# Patient Record
Sex: Male | Born: 1950 | Race: White | Hispanic: No | Marital: Married | Smoking: Never smoker
Health system: Southern US, Community
[De-identification: ages and names within clinical notes are randomized; demographics above are authoritative.]

## PROBLEM LIST (undated history)

## (undated) DIAGNOSIS — J90 Pleural effusion, not elsewhere classified: Secondary | ICD-10-CM

## (undated) DIAGNOSIS — I493 Ventricular premature depolarization: Secondary | ICD-10-CM

## (undated) DIAGNOSIS — E119 Type 2 diabetes mellitus without complications: Secondary | ICD-10-CM

## (undated) DIAGNOSIS — Z9989 Dependence on other enabling machines and devices: Secondary | ICD-10-CM

## (undated) DIAGNOSIS — I5032 Chronic diastolic (congestive) heart failure: Secondary | ICD-10-CM

## (undated) DIAGNOSIS — N189 Chronic kidney disease, unspecified: Secondary | ICD-10-CM

## (undated) DIAGNOSIS — K7682 Hepatic encephalopathy: Secondary | ICD-10-CM

## (undated) DIAGNOSIS — D126 Benign neoplasm of colon, unspecified: Secondary | ICD-10-CM

## (undated) DIAGNOSIS — K739 Chronic hepatitis, unspecified: Secondary | ICD-10-CM

## (undated) DIAGNOSIS — D61818 Other pancytopenia: Secondary | ICD-10-CM

## (undated) DIAGNOSIS — M1990 Unspecified osteoarthritis, unspecified site: Secondary | ICD-10-CM

## (undated) DIAGNOSIS — Z800 Family history of malignant neoplasm of digestive organs: Secondary | ICD-10-CM

## (undated) DIAGNOSIS — G4733 Obstructive sleep apnea (adult) (pediatric): Secondary | ICD-10-CM

## (undated) DIAGNOSIS — J302 Other seasonal allergic rhinitis: Secondary | ICD-10-CM

## (undated) DIAGNOSIS — K649 Unspecified hemorrhoids: Secondary | ICD-10-CM

## (undated) DIAGNOSIS — I77819 Aortic ectasia, unspecified site: Secondary | ICD-10-CM

## (undated) DIAGNOSIS — R0602 Shortness of breath: Secondary | ICD-10-CM

## (undated) DIAGNOSIS — K7581 Nonalcoholic steatohepatitis (NASH): Secondary | ICD-10-CM

## (undated) DIAGNOSIS — K7460 Unspecified cirrhosis of liver: Secondary | ICD-10-CM

## (undated) DIAGNOSIS — R1310 Dysphagia, unspecified: Secondary | ICD-10-CM

## (undated) DIAGNOSIS — I10 Essential (primary) hypertension: Secondary | ICD-10-CM

## (undated) DIAGNOSIS — K7290 Hepatic failure, unspecified without coma: Secondary | ICD-10-CM

## (undated) DIAGNOSIS — R161 Splenomegaly, not elsewhere classified: Secondary | ICD-10-CM

## (undated) DIAGNOSIS — Z139 Encounter for screening, unspecified: Secondary | ICD-10-CM

## (undated) HISTORY — DX: Dysphagia, unspecified: R13.10

## (undated) HISTORY — DX: Splenomegaly, not elsewhere classified: R16.1

## (undated) HISTORY — PX: HEMORRHOID SURGERY: SHX153

## (undated) HISTORY — DX: Chronic hepatitis, unspecified: K73.9

## (undated) HISTORY — DX: Essential (primary) hypertension: I10

## (undated) HISTORY — DX: Unspecified hemorrhoids: K64.9

## (undated) HISTORY — PX: ANAL FISSURE REPAIR: SHX2312

## (undated) HISTORY — DX: Benign neoplasm of colon, unspecified: D12.6

## (undated) HISTORY — DX: Other seasonal allergic rhinitis: J30.2

## (undated) HISTORY — DX: Type 2 diabetes mellitus without complications: E11.9

## (undated) HISTORY — DX: Aortic ectasia, unspecified site: I77.819

## (undated) HISTORY — DX: Family history of malignant neoplasm of digestive organs: Z80.0

---

## 1999-08-01 ENCOUNTER — Encounter: Admission: RE | Admit: 1999-08-01 | Discharge: 1999-08-01 | Payer: Self-pay | Admitting: Gastroenterology

## 1999-08-01 ENCOUNTER — Encounter: Payer: Self-pay | Admitting: Gastroenterology

## 1999-08-22 ENCOUNTER — Encounter (INDEPENDENT_AMBULATORY_CARE_PROVIDER_SITE_OTHER): Payer: Self-pay | Admitting: *Deleted

## 1999-08-22 ENCOUNTER — Ambulatory Visit (HOSPITAL_COMMUNITY): Admission: RE | Admit: 1999-08-22 | Discharge: 1999-08-22 | Payer: Self-pay | Admitting: Gastroenterology

## 2002-08-20 ENCOUNTER — Encounter: Admission: RE | Admit: 2002-08-20 | Discharge: 2002-11-18 | Payer: Self-pay | Admitting: Family Medicine

## 2002-11-18 ENCOUNTER — Encounter: Admission: RE | Admit: 2002-11-18 | Discharge: 2003-02-16 | Payer: Self-pay | Admitting: Family Medicine

## 2003-04-28 ENCOUNTER — Encounter (INDEPENDENT_AMBULATORY_CARE_PROVIDER_SITE_OTHER): Payer: Self-pay | Admitting: *Deleted

## 2003-04-28 ENCOUNTER — Ambulatory Visit (HOSPITAL_COMMUNITY): Admission: RE | Admit: 2003-04-28 | Discharge: 2003-04-28 | Payer: Self-pay | Admitting: Gastroenterology

## 2004-01-12 ENCOUNTER — Ambulatory Visit (HOSPITAL_BASED_OUTPATIENT_CLINIC_OR_DEPARTMENT_OTHER): Admission: RE | Admit: 2004-01-12 | Discharge: 2004-01-12 | Payer: Self-pay | Admitting: Family Medicine

## 2004-04-18 ENCOUNTER — Ambulatory Visit: Payer: Self-pay | Admitting: Pulmonary Disease

## 2004-10-03 ENCOUNTER — Encounter: Admission: RE | Admit: 2004-10-03 | Discharge: 2005-01-01 | Payer: Self-pay | Admitting: Family Medicine

## 2005-02-23 ENCOUNTER — Encounter: Admission: RE | Admit: 2005-02-23 | Discharge: 2005-05-24 | Payer: Self-pay | Admitting: Family Medicine

## 2005-11-07 ENCOUNTER — Encounter
Admission: RE | Admit: 2005-11-07 | Discharge: 2005-11-07 | Payer: Self-pay | Admitting: Certified Registered Nurse Anesthetist

## 2007-03-28 ENCOUNTER — Encounter
Admission: RE | Admit: 2007-03-28 | Discharge: 2007-03-28 | Payer: Self-pay | Admitting: Certified Registered Nurse Anesthetist

## 2008-01-30 ENCOUNTER — Encounter: Admission: RE | Admit: 2008-01-30 | Discharge: 2008-01-30 | Payer: Self-pay | Admitting: Gastroenterology

## 2008-06-01 ENCOUNTER — Encounter: Admission: RE | Admit: 2008-06-01 | Discharge: 2008-06-01 | Payer: Self-pay | Admitting: Gastroenterology

## 2008-10-05 ENCOUNTER — Ambulatory Visit (HOSPITAL_COMMUNITY): Admission: RE | Admit: 2008-10-05 | Discharge: 2008-10-06 | Payer: Self-pay | Admitting: Surgery

## 2008-10-05 ENCOUNTER — Encounter (INDEPENDENT_AMBULATORY_CARE_PROVIDER_SITE_OTHER): Payer: Self-pay | Admitting: Surgery

## 2009-04-26 ENCOUNTER — Encounter: Admission: RE | Admit: 2009-04-26 | Discharge: 2009-04-26 | Payer: Self-pay | Admitting: Gastroenterology

## 2009-06-30 ENCOUNTER — Ambulatory Visit (HOSPITAL_BASED_OUTPATIENT_CLINIC_OR_DEPARTMENT_OTHER): Admission: RE | Admit: 2009-06-30 | Discharge: 2009-06-30 | Payer: Self-pay | Admitting: Family Medicine

## 2009-07-03 ENCOUNTER — Ambulatory Visit: Payer: Self-pay | Admitting: Internal Medicine

## 2010-06-02 ENCOUNTER — Encounter
Admission: RE | Admit: 2010-06-02 | Discharge: 2010-06-02 | Payer: Self-pay | Source: Home / Self Care | Attending: Certified Registered Nurse Anesthetist | Admitting: Certified Registered Nurse Anesthetist

## 2010-06-26 ENCOUNTER — Encounter: Payer: Self-pay | Admitting: Gastroenterology

## 2010-09-13 LAB — BASIC METABOLIC PANEL
BUN: 17 mg/dL (ref 6–23)
Creatinine, Ser: 0.99 mg/dL (ref 0.4–1.5)
GFR calc non Af Amer: >60 mL/min (ref >60)
Glucose, Bld: 176 mg/dL — ABNORMAL HIGH (ref 70–99)

## 2010-09-13 LAB — CBC
HCT: 41.9 % (ref 39.0–52.0)
MCV: 94.9 fL (ref 78.0–100.0)
Platelets: 134 10*3/uL — ABNORMAL LOW (ref 150–400)
RDW: 12.8 % (ref 11.5–15.5)
WBC: 6.2 10*3/uL (ref 4.0–10.5)

## 2010-09-13 LAB — HEPATIC FUNCTION PANEL
ALT: 43 U/L (ref 0–53)
Bilirubin, Direct: 0.2 mg/dL (ref 0.0–0.3)
Indirect Bilirubin: 0.9 mg/dL (ref 0.3–0.9)
Total Protein: 6.8 g/dL (ref 6.0–8.3)

## 2010-09-13 LAB — APTT: aPTT: 33 seconds (ref 24–37)

## 2010-09-13 LAB — PROTIME-INR: INR: 1.1 (ref 0.00–1.49)

## 2010-10-18 NOTE — Op Note (Signed)
NAMEIRVEN, INGALSBE NO.:  [id number]   MEDICAL RECORD NO.:  [id number]          PATIENT TYPE:  AMB   LOCATION:  SDS                          FACILITY:  [facility]   PHYSICIAN:  Ardeth Sportsman, MD     DATE OF BIRTH:  1950-11-20   DATE OF PROCEDURE:  10/05/2008  DATE OF DISCHARGE:                               OPERATIVE REPORT   PRIMARY CARE PHYSICIAN:  Elvera Lennox, [place]   GASTROENTEROLOGIST:  Bernette Redbird, MD., [facility]   SURGEON:  Ardeth Sportsman, MD   ASSISTANT:  None.   PREOPERATIVE DIAGNOSES:  1. Severe anal pain and bleeding.  2. Posterior anal fissure.  3. Hemorrhoids with bleeding and prolapse.   POSTOPERATIVE DIAGNOSES:  1. 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  2. Prolapsing hemorrhoids, left lateral and right anterior, greater      than right posterior.   PROCEDURES PERFORMED:  1. Examination under anesthesia.  2. Left lateral internal sphincterotomy.  3. Left lateral hemorrhoidectomy.  4. Right anterior hemorrhoidectomy.   ANESTHESIA:  1. General anesthesia.  2. Bilateral anorectal field block.   ESTIMATED BLOOD LOSS:  100 mL.   DRAINS:  None.   COMPLICATIONS:  None apparent.   INDICATIONS:  Mr. Wedel is a 60 year old morbidly obese male who has had  severe pain in the perianal region with evidence of anal fissure.  He  has had colonoscopy in 2008 that just showed some adenomas in the  distant past.  He had hemorrhoidectomy over 10 years ago.  He had  evidence of severe tenderness in his posterior midline and fissure in  the office as well as some prolapse and hemorrhoids.  He had tried  Diltiazem for over 2 weeks without any improvement in burning.  Given  his maximal medical therapy without any improvement, recommendation was  made for exam under anesthesia with probable left lateral internal  sphincterotomy and probably hemorrhoidectomy as well.  Risks, benefits,  and  alternatives were discussed, questions answered, and he agreed to  proceed.   OPERATIVE FINDINGS:  He had a very hypertensive internal hemorrhoidal  sphincter.  External seemed intact.  He did have moderate prolapsing.  He did have chronic internal and external hemorrhoids in the left  lateral and right anterior aspect with prolapse.   DESCRIPTION OF PROCEDURE:  Informed consent was confirmed.  The patient  received IV cephalexin per Surgery.  He had sequential compression  devices active during the entire case.  He underwent general anesthesia  with laryngeal mask airway and positioned in high lithotomy without any  difficulty.  A surgical time-out confirmed our plan.   Digital examination felt the hypertensive sphincter and examination also  noted a posterior midline sphincter.  His left lateral hemorrhoid was  very prolapsed and large, therefore, after moving up and taking a clamp  on the hemorrhoid and using a cautery to help trim the left lateral  hemorrhoid as best as possible.  I did try and I clamped the base to do  a suture ligature with 2-0 chromic  stitch.   I was able to easily separate the internal sphincter from the external  sphincter in the groove  and elevated and transected that using  a  cautery.  I tried to transect it about the distal 2/3rd of the  sphincter, trying to keep some proximal internal sphincter intact, that  was above where the fissure was, that relaxed the sphincter well.  The  wound was closed using running 2-0 chromic stitch in the longitudinal  fashion.  I did close at the anal verge more horizontally just to avoid  any tightening of the area.   He had a very large right anterior hemorrhoid as well and excising it in  similar fashion, taking clamp and using cautery to excise out the  hemorrhoidal tissue.  I aired on the side of keeping some rectum and  anoderm, and taking hemorrhoidal tissue underneath creating a little bit  of rectal flaps.   There was brisk bleeding of large hemorrhoidal vessels  that took some suture ligature to ultimately control.  That wound was  closed in a similar fashion as well.   Copious irrigation was done with clear return at the end.  There were  little bit of oozing on each side that I controlled with a figure-of-  eight suture ligature on each closure site.  Good result.  Hemostasis  was good at the end.  Wound was packed with a large Gelfoam up into the  rectum.  Counts were correct.  The patient was extubated and taken to  recovery room in stable condition.   I am about to explain the operative findings to the patient's wife.  Per  his request, I did discuss postoperative care with him in detail as  well.  He should be able to go home later today if his pain is  adequately controlled.      Ardeth Sportsman, MD  Electronically Signed     SCG/MEDQ  D:  10/05/2008  T:  10/06/2008  Job:  [id number]   cc:   Bernette Redbird, M.D.  Elvera Lennox, [place]

## 2010-10-21 NOTE — Procedures (Signed)
[facility]  Patient:    Peter Larson, Peter Larson                       MRN: [id number] Proc. Date: 08/22/99 Adm. Date:  [id number] Attending:  Rich Brave CC:         Anna Genre. Little, M.D.                           Procedure Report  PROCEDURE:  Colonoscopy with polypectomy.  SURGEON:  Florencia Reasons, M.D.  INDICATIONS:  A 60 year old with family history of colon cancer.  FINDINGS:  Seven polyps removed, most of them approximately 6-8 mm in diameter nd semipedunculated in morphology.  Mild sigmoid diverticulosis.  PROCEDURE:  The nature, purpose, and risks of the procedure had been discussed ith the patient, who provided written consent.  Sedation was fentanyl 100 mcg and Versed 10 mg IV without arrhythmias or significant desaturation during the course of the procedure.  The Olympus adult video colonoscope was easily advanced to the level just above the ileocecal valve, allowing me to look into the cecum.  I was able to see virtually the entire cecal surface area, including the part just below the ileocecal valve. We were not able to intubate the base of the cecum despite having the patient in the supine position and applying some external abdominal compression.  Although, there was slight friability of the ileocecal valve, I did not feel it was abnormal in appearance.  Pullback was then performed.  The quality of the prep as excellent and it was felt that all areas were well seen.  During the course of both insertion and extraction, I removed a total of seven polyps in this patient.  Two or three were quite small, probably on the order of about 4 mm or so, but slightly semipedunculated in configuration and amenable to snare removal.  There were also several additional polyps, about 8-10 mm in diameter, which were semipedunculated and readily snared off.  There was no problem with significant bleeding or any excessive cautery  at any f the polypectomy sites.  There was some mild sigmoid diverticulosis, but no colitis, vascular malformations, obvious cancers or severe diverticulosis was observed.  PLAN:  Await pathology on todays polyps with anticipated colonoscopic follow-up in three years. DD:  08/22/99 TD:  08/22/99 Job: [id number]

## 2010-10-21 NOTE — Op Note (Signed)
NAME:  Peter Larson, Peter Larson                          ACCOUNT NO.:  [id number]   MEDICAL RECORD NO.:  [id number]                   PATIENT TYPE:  AMB   LOCATION:  ENDO                                 FACILITY:  [facility]   PHYSICIAN:  Bernette Redbird, M.D.                DATE OF BIRTH:  1951/04/12   DATE OF PROCEDURE:  04/28/2003  DATE OF DISCHARGE:                                 OPERATIVE REPORT   PROCEDURE:  Colonoscopy with biopsy.   INDICATIONS FOR PROCEDURE:  A 60 year old with family history of colon  cancer who presents with history of numerous small adenomatous polyps having  been removed colonoscopically three years ago.   FINDINGS:  Solitary sessile diminutive polyp. Mild sigmoid diverticulosis.  Small portion of cecum not seen.   DESCRIPTION OF PROCEDURE:  The nature, purpose and risk of the procedure  were familiar to the patient from prior examination. He provided written  consent. Sedation was fentanyl 50 mcg and Versed 4 mg IV without arrhythmias  or desaturation, to a level of very mild sedation. Digital exam of the  prostate was suboptimal due to the patient's large body habitus but no  distal prostatic abnormalities were appreciated.   The Olympus adult video colonoscope was used for this procedure and advanced  to its entire length. Unfortunately, the tip of the scope never got below  the level of the ileocecal valve so I was never able to see immediately  under the valve. The remainder of the cecum, however, looked normal. We  turned the patient into the supine position, right lateral decubitus  position and prone position as well as the standard left lateral decubitus  position, we applied external abdominal compression and we used the biopsy  forceps to elevate the cecal mucosa. With all these maneuvers, the vast  majority of the cecum was able to be seen and as mentioned above, just a  small portion immediately under the valve was never truly visualized. I am  confident that no significant mass, lesions were present but small or flat  polyps could have been missed if they were in that location. Pullback was  then performed. The quality of the prep was quite good with a little bit of  stool film on the proximal colon, which it is not felt would have obscured  any significant findings. At about 30 cm from the external anal opening  (probably about 25 cm from the anal verge), I encountered a 2-3 mm sessile  polyp removed by a single cold biopsy.   No other polyps were seen. There was no evidence of cancer, colitis or  vascular malformations.   There was mild sigmoid diverticulosis.  Retroflexion in the rectum and  reinspection of the rectosigmoid was otherwise unremarkable.   The patient tolerated the procedure well and there were no apparent  complications.   IMPRESSION:  Solitary polyp in a patient with a family history  of colon  cancer and a personal history of several adenomas  having previously been  removed.   PLAN:  Await pathology results. Anticipate followup colonoscopy in about  three years because of the inability to visualize the entire cecum and  because of the suboptimal enlarged number of previous polyps, despite the  favorable findings on this exam.                                               Bernette Redbird, M.D.    RB/MEDQ  D:  04/28/2003  T:  04/28/2003  Job:  [id number]   cc:   Caryn Bee L. Little, M.D.  [address]  [place]  [place] [address]  Fax: [phone]

## 2010-10-21 NOTE — Procedures (Signed)
NAME:  Peter Larson, Peter Larson              ACCOUNT NO.:  [id number]   MEDICAL RECORD NO.:  [id number]          PATIENT TYPE:  OUT   LOCATION:  SLEEP CENTER                 FACILITY:  [facility]   PHYSICIAN:  Clinton D. Maple Hudson, M.D. DATE OF BIRTH:  08/07/1950   DATE OF ADMISSION:  01/12/2004  DATE OF DISCHARGE:  01/12/2004                              NOCTURNAL POLYSOMNOGRAM   REFERRING PHYSICIAN:  Dr. Caryn Bee L. Little.   INDICATION FOR STUDY:  Hypersomnia with sleep apnea.   EPWORTH SLEEPINESS SCORE:  15/24.   NECK SIZE:  21.5.   BODY MASS INDEX:  BMI 45, weight 350 pounds.   MEDICATIONS LISTED:  Allegra, Astelin, Ecotrin, gemfibrozil,  hydrochlorothiazide, Nasonex, Nexium, Quinapril, Xenical.   SLEEP ARCHITECTURE:  Total sleep time 314 minutes with sleep deficiency 82%;  stage I was 7%, stage II was 68%, stages III and IV 19%; REM was 6% of total  sleep time.  Latency to sleep onset 10 minutes.  Latency to REM 279 minutes.  Awake after sleep onset -- 59 minutes.  Arousal index 52 per hour, mostly  with respiratory events.   RESPIRATORY DATA:   SPLIT-STUDY PROTOCOL:  Moderately severe obstructive sleep apnea/hypopnea,  RDI 37.9 before CPAP.  This included 102 hypopneas.  Events were not  positional.  REM RDI 6.3 per hour.  CPAP was titrated to 17 CWP, RDI 3.9 per  hour using a large Ultra Mirage full-face mask with heated humidifier,  noting complaint of sinus problem.   OXYGEN DATA:  Very loud snoring before CPAP.  Baseline room air oxygen  saturation while awake was 94% and mean oxygen saturation through the study  was 90% to 91%.  There was moderate desaturation during apneas before CPAP  to 82%.  Normal oxygenation on CPAP.   CARDIAC DATA:  Normal sinus rhythm.   MOVEMENT/PARASOMNIA:  One hundred and thirty limb jerks of which 57 caused  arousal or awakening for a periodic limb movement with arousal index of 10.9  per hour, indicating mild-to-moderate periodic limb movement with  arousal.   IMPRESSION/RECOMMENDATION:  Moderately severe obstructive sleep  apnea/hypopnea, respiratory disturbance index 37.9 per hour.  Good control  on continuous positive airway pressure at 17 CWP, respiratory disturbance  index 3.9 per hour, using a large Ultra Mirage full-face mass with heated  humidifier.  Mild-to-moderate oxygen desaturation was corrected by  continuous positive airway pressure.  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                                   ______________________________                                Rennis Chris. Maple Hudson, M.D.                                Diplomate, American Board of Sleep Medicine    CDY/MEDQ  D:  01/17/2004 14:20:39  T:  01/18/2004 11:50:15  Job:  [id number]

## 2011-06-12 ENCOUNTER — Other Ambulatory Visit: Payer: Self-pay | Admitting: Dermatology

## 2011-07-26 ENCOUNTER — Other Ambulatory Visit: Payer: Self-pay | Admitting: Gastroenterology

## 2011-07-26 DIAGNOSIS — K7460 Unspecified cirrhosis of liver: Secondary | ICD-10-CM

## 2011-07-28 ENCOUNTER — Ambulatory Visit
Admission: RE | Admit: 2011-07-28 | Discharge: 2011-07-28 | Disposition: A | Discharge status: Home / Self Care | Payer: 59 | Source: Ambulatory Visit | Attending: Gastroenterology | Admitting: Gastroenterology

## 2011-07-28 DIAGNOSIS — K7460 Unspecified cirrhosis of liver: Secondary | ICD-10-CM

## 2012-02-08 ENCOUNTER — Other Ambulatory Visit: Payer: Self-pay | Admitting: Gastroenterology

## 2013-01-01 DIAGNOSIS — E119 Type 2 diabetes mellitus without complications: Secondary | ICD-10-CM | POA: Insufficient documentation

## 2013-01-09 ENCOUNTER — Other Ambulatory Visit: Payer: Self-pay | Admitting: Gastroenterology

## 2013-01-09 DIAGNOSIS — K7689 Other specified diseases of liver: Secondary | ICD-10-CM

## 2013-01-13 ENCOUNTER — Ambulatory Visit
Admission: RE | Admit: 2013-01-13 | Discharge: 2013-01-13 | Disposition: A | Discharge status: Home / Self Care | Payer: 59 | Source: Ambulatory Visit | Attending: Gastroenterology | Admitting: Gastroenterology

## 2013-01-13 DIAGNOSIS — K7689 Other specified diseases of liver: Secondary | ICD-10-CM

## 2013-01-13 MED ORDER — IOHEXOL 300 MG/ML  SOLN
125.0000 mL | Freq: Once | INTRAMUSCULAR | Status: AC | PRN
  Administered 2013-01-13: 125 mL via INTRAVENOUS

## 2013-05-16 ENCOUNTER — Other Ambulatory Visit (HOSPITAL_COMMUNITY): Payer: 59

## 2013-05-22 ENCOUNTER — Ambulatory Visit: Payer: 59 | Admitting: Cardiology

## 2013-06-03 ENCOUNTER — Other Ambulatory Visit (HOSPITAL_COMMUNITY): Payer: 59

## 2013-07-16 ENCOUNTER — Encounter: Payer: Self-pay | Admitting: General Surgery

## 2013-07-16 DIAGNOSIS — I10 Essential (primary) hypertension: Secondary | ICD-10-CM | POA: Insufficient documentation

## 2013-07-16 DIAGNOSIS — I493 Ventricular premature depolarization: Secondary | ICD-10-CM | POA: Insufficient documentation

## 2013-07-16 DIAGNOSIS — G4733 Obstructive sleep apnea (adult) (pediatric): Secondary | ICD-10-CM | POA: Insufficient documentation

## 2013-07-16 DIAGNOSIS — E669 Obesity, unspecified: Secondary | ICD-10-CM | POA: Insufficient documentation

## 2013-07-23 ENCOUNTER — Ambulatory Visit: Payer: 59 | Admitting: Cardiology

## 2013-07-23 ENCOUNTER — Other Ambulatory Visit (HOSPITAL_COMMUNITY): Payer: 59

## 2013-08-19 ENCOUNTER — Other Ambulatory Visit: Payer: Self-pay | Admitting: General Surgery

## 2013-08-19 DIAGNOSIS — I77819 Aortic ectasia, unspecified site: Secondary | ICD-10-CM

## 2013-08-20 ENCOUNTER — Ambulatory Visit (INDEPENDENT_AMBULATORY_CARE_PROVIDER_SITE_OTHER): Payer: 59 | Admitting: Cardiology

## 2013-08-20 ENCOUNTER — Encounter: Payer: Self-pay | Admitting: Cardiology

## 2013-08-20 ENCOUNTER — Encounter: Payer: Self-pay | Admitting: Cardiovascular Disease

## 2013-08-20 ENCOUNTER — Ambulatory Visit (HOSPITAL_COMMUNITY): Payer: 59 | Attending: Cardiovascular Disease | Admitting: Cardiology

## 2013-08-20 VITALS — BP 120/60 | HR 60 | Ht 73.0 in | Wt 345.0 lb

## 2013-08-20 DIAGNOSIS — G4733 Obstructive sleep apnea (adult) (pediatric): Secondary | ICD-10-CM

## 2013-08-20 DIAGNOSIS — I493 Ventricular premature depolarization: Secondary | ICD-10-CM

## 2013-08-20 DIAGNOSIS — I77819 Aortic ectasia, unspecified site: Secondary | ICD-10-CM | POA: Insufficient documentation

## 2013-08-20 DIAGNOSIS — I4949 Other premature depolarization: Secondary | ICD-10-CM

## 2013-08-20 DIAGNOSIS — R0609 Other forms of dyspnea: Secondary | ICD-10-CM | POA: Insufficient documentation

## 2013-08-20 DIAGNOSIS — R0602 Shortness of breath: Secondary | ICD-10-CM

## 2013-08-20 DIAGNOSIS — E119 Type 2 diabetes mellitus without complications: Secondary | ICD-10-CM | POA: Insufficient documentation

## 2013-08-20 DIAGNOSIS — I10 Essential (primary) hypertension: Secondary | ICD-10-CM

## 2013-08-20 DIAGNOSIS — E669 Obesity, unspecified: Secondary | ICD-10-CM

## 2013-08-20 DIAGNOSIS — R0989 Other specified symptoms and signs involving the circulatory and respiratory systems: Secondary | ICD-10-CM | POA: Insufficient documentation

## 2013-08-20 NOTE — Patient Instructions (Signed)
Your physician recommends that you continue on your current medications as directed. Please refer to the Current Medication list given to you today.  We already contacted [facility] and we ordered a ResMed AirFit P10 nasal pillow mask with chin strap and we will get a 2 week autotitration from 4 to 20cm H2O.   Your physician wants you to follow-up in: 6 Months with Peter Larson will receive a reminder letter in the mail two months in advance. If you don't receive a letter, please call our office to schedule the follow-up appointment.

## 2013-08-20 NOTE — Progress Notes (Signed)
[address] [place], [place]  [address] Phone: [phone] Fax:  [phone]  Date:  08/20/2013   ID:  Peter Larson, DOB 16-Mar-1951, MRN [id number]  PCP:  Namon Cirri  Cardiologist:  Fransico Him, MD     History of Present Illness: Peter Larson is a 63 y.o. male with a history of PVC's, OSA, obesity and HTN who presents today for followup.  He is doing well.  He denies any chest pain, LE edema, dizziness, palpitations or syncope.  He tolerates his CPAP device well.  He has not been tolerating his mask and says it keeps moving.  He thinks that the pressure is making his mask move.  He feels rested in the am but does cat nap during the day. Some days he exercises but other days not because he feels tired.  He has chronic DOE due to sedentary state and obesity and this is stable.  Wt Readings from Last 3 Encounters:  No data found for Wt     Past Medical History  Diagnosis Date  . Sleep apnea   . Liver disease     /fatty liver??early cirrhoisis--mild  . Splenomegaly     slt thrombocytopenia;no complics. egd 3/47 neg for varices, ?mild portal gastropathy; 02/2011 nl,novarices  . 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 Hypertension   . Adenomatous colon polyp '01 & '08  . Hemorrhoids     Severe anorectal pain and anal fissure w bleeding following hemorrhoidectomy may 2010  . Family history of colon cancer     mother --75  . Seasonal allergies   . Gila (hepatocellular carcinoma)     Screening CT neg 05/2008, U/S neg 04/2009,05/2010  . Dysphagia     BaS/tab neg 8/09; egd's neg for ring/strict--?dysmotility  . Chronic hepatitis   . 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 cetirizine (ZYRTEC) 10 MG tablet Take 10 mg by mouth daily.      . cholecalciferol (VITAMIN D) 1000 UNITS tablet Take 2,000 Units by mouth daily.      Marland Kitchen esomeprazole (NEXIUM) 40 MG capsule Take 40 mg  by mouth daily at 12 noon.      . furosemide (LASIX) 20 MG tablet Take 20 mg by mouth as needed (leg swelling).      Marland Kitchen losartan-hydrochlorothiazide (HYZAAR) 100-25 MG per tablet Take 1 tablet by mouth daily.      . milk thistle 175 MG tablet Take 350 mg by mouth daily.      . Multiple Vitamin (MULTIVITAMIN) tablet Take 1 tablet by mouth daily.      Marland Kitchen omega-3 acid ethyl esters (LOVAZA) 1 G capsule Take 2 g by mouth 2 (two) times daily.      Marland Kitchen spironolactone (ALDACTONE) 50 MG tablet Take 50 mg by mouth.       No current facility-administered medications for this visit.    Allergies:   No Known Allergies  Social History:  The patient  reports that he has never smoked. He does not have any smokeless tobacco history on file. He reports that he does not drink alcohol or use illicit drugs.   Family History:  The patient's family history is not on file.   ROS:  Please see the history of present illness.      All other systems reviewed and negative.   PHYSICAL EXAM: VS:  There were no vitals taken for this visit. Well nourished, well developed, in no acute distress HEENT: normal Neck: no JVD Cardiac:  normal S1, S2; RRR; no murmur Lungs:  clear to auscultation bilaterally, no wheezing, rhonchi or rales Abd: soft, nontender, no hepatomegaly Ext: no edema Skin: warm and dry Neuro:  CNs 2-12 intact, no focal abnormalities noted      ASSESSMENT AND PLAN:  1. OSA on CPAP but not tolerating well due to mask not fitting and feeling pressure is not right - I will order him a ResMed AirFit P10 nasal pillow mask with chin strap and get a 2 week autotitration from 4 to 20cm H2O 2. Obesity 3. HTN - well controlled - Hyzaar/aldactone 4. PVC's - controlled with no palpitations 5. Mildly dilated aortic root - echo repeated today  Followup with me in 6 months  Signed, Fransico Him, MD 08/20/2013 9:46 AM

## 2013-08-20 NOTE — Progress Notes (Signed)
Limited echo performed. 

## 2013-08-21 ENCOUNTER — Encounter (HOSPITAL_COMMUNITY): Payer: Self-pay | Admitting: Cardiology

## 2013-09-16 ENCOUNTER — Encounter: Payer: Self-pay | Admitting: Cardiology

## 2013-09-19 ENCOUNTER — Encounter: Payer: Self-pay | Admitting: General Surgery

## 2013-11-04 ENCOUNTER — Ambulatory Visit
Admission: RE | Admit: 2013-11-04 | Discharge: 2013-11-04 | Disposition: A | Discharge status: Home / Self Care | Payer: 59 | Source: Ambulatory Visit | Attending: Physician Assistant | Admitting: Physician Assistant

## 2013-11-04 ENCOUNTER — Other Ambulatory Visit: Payer: Self-pay | Admitting: Physician Assistant

## 2013-11-04 DIAGNOSIS — R635 Abnormal weight gain: Secondary | ICD-10-CM

## 2013-11-13 ENCOUNTER — Other Ambulatory Visit: Payer: Self-pay | Admitting: Gastroenterology

## 2013-11-13 DIAGNOSIS — R600 Localized edema: Secondary | ICD-10-CM

## 2013-11-13 DIAGNOSIS — K7460 Unspecified cirrhosis of liver: Secondary | ICD-10-CM

## 2013-11-13 DIAGNOSIS — R1900 Intra-abdominal and pelvic swelling, mass and lump, unspecified site: Secondary | ICD-10-CM

## 2013-11-19 ENCOUNTER — Ambulatory Visit
Admission: RE | Admit: 2013-11-19 | Discharge: 2013-11-19 | Disposition: A | Discharge status: Home / Self Care | Payer: 59 | Source: Ambulatory Visit | Attending: Gastroenterology | Admitting: Gastroenterology

## 2013-11-19 DIAGNOSIS — K7460 Unspecified cirrhosis of liver: Secondary | ICD-10-CM

## 2013-11-19 DIAGNOSIS — R1900 Intra-abdominal and pelvic swelling, mass and lump, unspecified site: Secondary | ICD-10-CM

## 2013-11-19 DIAGNOSIS — R600 Localized edema: Secondary | ICD-10-CM

## 2013-12-25 ENCOUNTER — Encounter: Payer: Self-pay | Admitting: Cardiology

## 2013-12-31 ENCOUNTER — Telehealth: Payer: Self-pay | Admitting: Cardiology

## 2013-12-31 NOTE — Telephone Encounter (Signed)
Pt is aware. He will let us know what him and his Dr at [facility] decide.

## 2013-12-31 NOTE — Telephone Encounter (Signed)
New Prob    Pt states his physician has reccommended an ECHO. However, pt is requesting Dr. Radford Pax put in an order for this as he feels it will be much easier for him to have it done here in our office. Advised pt to call [facility] and have them call us to schedule with order from provider. Please call.

## 2013-12-31 NOTE — Telephone Encounter (Signed)
If [facility] is ordering echo they will need to call us to order it since we do not know what it is for

## 2013-12-31 NOTE — Telephone Encounter (Signed)
Pt just had echo done in March of this year. We ordered last echo for him... You said to repeat in one year.Madaline Brilliant to order for pt?

## 2014-01-13 ENCOUNTER — Other Ambulatory Visit: Payer: Self-pay | Admitting: General Surgery

## 2014-01-13 ENCOUNTER — Ambulatory Visit (HOSPITAL_COMMUNITY): Payer: 59 | Attending: Physician Assistant | Admitting: Radiology

## 2014-01-13 DIAGNOSIS — I059 Rheumatic mitral valve disease, unspecified: Secondary | ICD-10-CM | POA: Diagnosis not present

## 2014-01-13 DIAGNOSIS — R0602 Shortness of breath: Secondary | ICD-10-CM | POA: Diagnosis present

## 2014-01-13 NOTE — Progress Notes (Signed)
Echocardiogram performed.  

## 2014-01-14 ENCOUNTER — Other Ambulatory Visit: Payer: Self-pay | Admitting: General Surgery

## 2014-01-14 DIAGNOSIS — I77810 Thoracic aortic ectasia: Secondary | ICD-10-CM

## 2014-01-20 ENCOUNTER — Telehealth: Payer: Self-pay | Admitting: Cardiology

## 2014-01-20 NOTE — Telephone Encounter (Signed)
Error

## 2014-01-20 NOTE — Telephone Encounter (Signed)
lmtrc

## 2014-01-20 NOTE — Telephone Encounter (Signed)
°  Pt would like for Danielle to give him a call regarding test results. Please call and advise.

## 2014-01-22 NOTE — Telephone Encounter (Signed)
lmtrc

## 2014-01-22 NOTE — Telephone Encounter (Signed)
Follow up ° ° ° ° ° °Returning Danielle's call °

## 2014-01-23 NOTE — Telephone Encounter (Signed)
Spoke with pt and verified results with Echo.

## 2014-01-29 ENCOUNTER — Inpatient Hospital Stay (HOSPITAL_COMMUNITY)
Admission: EM | Admit: 2014-01-29 | Discharge: 2014-02-01 | DRG: 292 | Disposition: A | Discharge status: Home / Self Care | Payer: 59 | Attending: Interventional Cardiology | Admitting: Interventional Cardiology

## 2014-01-29 ENCOUNTER — Emergency Department (HOSPITAL_COMMUNITY): Payer: 59

## 2014-01-29 ENCOUNTER — Encounter (HOSPITAL_COMMUNITY): Payer: Self-pay | Admitting: Emergency Medicine

## 2014-01-29 DIAGNOSIS — K7689 Other specified diseases of liver: Secondary | ICD-10-CM | POA: Diagnosis present

## 2014-01-29 DIAGNOSIS — I10 Essential (primary) hypertension: Secondary | ICD-10-CM | POA: Diagnosis present

## 2014-01-29 DIAGNOSIS — I129 Hypertensive chronic kidney disease with stage 1 through stage 4 chronic kidney disease, or unspecified chronic kidney disease: Secondary | ICD-10-CM | POA: Diagnosis present

## 2014-01-29 DIAGNOSIS — I77819 Aortic ectasia, unspecified site: Secondary | ICD-10-CM | POA: Diagnosis present

## 2014-01-29 DIAGNOSIS — I509 Heart failure, unspecified: Secondary | ICD-10-CM | POA: Diagnosis present

## 2014-01-29 DIAGNOSIS — I5033 Acute on chronic diastolic (congestive) heart failure: Secondary | ICD-10-CM | POA: Diagnosis not present

## 2014-01-29 DIAGNOSIS — N183 Chronic kidney disease, stage 3 unspecified: Secondary | ICD-10-CM | POA: Diagnosis present

## 2014-01-29 DIAGNOSIS — E669 Obesity, unspecified: Secondary | ICD-10-CM | POA: Diagnosis present

## 2014-01-29 DIAGNOSIS — I5032 Chronic diastolic (congestive) heart failure: Secondary | ICD-10-CM | POA: Diagnosis present

## 2014-01-29 DIAGNOSIS — E119 Type 2 diabetes mellitus without complications: Secondary | ICD-10-CM | POA: Diagnosis present

## 2014-01-29 DIAGNOSIS — K7460 Unspecified cirrhosis of liver: Secondary | ICD-10-CM | POA: Diagnosis present

## 2014-01-29 DIAGNOSIS — I493 Ventricular premature depolarization: Secondary | ICD-10-CM | POA: Diagnosis present

## 2014-01-29 DIAGNOSIS — D649 Anemia, unspecified: Secondary | ICD-10-CM | POA: Diagnosis present

## 2014-01-29 DIAGNOSIS — R0602 Shortness of breath: Secondary | ICD-10-CM | POA: Diagnosis present

## 2014-01-29 DIAGNOSIS — G4733 Obstructive sleep apnea (adult) (pediatric): Secondary | ICD-10-CM

## 2014-01-29 DIAGNOSIS — Z6841 Body Mass Index (BMI) 40.0 and over, adult: Secondary | ICD-10-CM

## 2014-01-29 DIAGNOSIS — I4949 Other premature depolarization: Secondary | ICD-10-CM

## 2014-01-29 DIAGNOSIS — Z860100 Personal history of colon polyps, unspecified: Secondary | ICD-10-CM

## 2014-01-29 DIAGNOSIS — R161 Splenomegaly, not elsewhere classified: Secondary | ICD-10-CM | POA: Diagnosis present

## 2014-01-29 DIAGNOSIS — D61818 Other pancytopenia: Secondary | ICD-10-CM | POA: Diagnosis present

## 2014-01-29 HISTORY — DX: Morbid (severe) obesity due to excess calories: E66.01

## 2014-01-29 HISTORY — DX: Chronic diastolic (congestive) heart failure: I50.32

## 2014-01-29 HISTORY — DX: Other pancytopenia: D61.818

## 2014-01-29 HISTORY — DX: Ventricular premature depolarization: I49.3

## 2014-01-29 HISTORY — DX: Unspecified cirrhosis of liver: K74.60

## 2014-01-29 HISTORY — DX: Encounter for screening, unspecified: Z13.9

## 2014-01-29 LAB — PRO B NATRIURETIC PEPTIDE: Pro B Natriuretic peptide (BNP): 1227 pg/mL — ABNORMAL HIGH (ref 0–125)

## 2014-01-29 LAB — I-STAT CHEM 8, ED
BUN: 34 mg/dL — ABNORMAL HIGH (ref 6–23)
CALCIUM ION: 1.16 mmol/L (ref 1.13–1.30)
Chloride: 108 mEq/L (ref 96–112)
Creatinine, Ser: 1.7 mg/dL — ABNORMAL HIGH (ref 0.50–1.35)
Glucose, Bld: 87 mg/dL (ref 70–99)
HCT: 32 % — ABNORMAL LOW (ref 39.0–52.0)
HEMOGLOBIN: 10.9 g/dL — AB (ref 13.0–17.0)
Potassium: 4.7 mEq/L (ref 3.7–5.3)
Sodium: 140 mEq/L (ref 137–147)
TCO2: 21 mmol/L (ref 0–100)

## 2014-01-29 LAB — CBC
HEMATOCRIT: 32.5 % — AB (ref 39.0–52.0)
Hemoglobin: 10.8 g/dL — ABNORMAL LOW (ref 13.0–17.0)
MCH: 32.8 pg (ref 26.0–34.0)
MCHC: 33.2 g/dL (ref 30.0–36.0)
MCV: 98.8 fL (ref 78.0–100.0)
PLATELETS: 66 10*3/uL — AB (ref 150–400)
RBC: 3.29 MIL/uL — AB (ref 4.22–5.81)
RDW: 14.1 % (ref 11.5–15.5)
WBC: 3.9 10*3/uL — AB (ref 4.0–10.5)

## 2014-01-29 LAB — BASIC METABOLIC PANEL
ANION GAP: 14 (ref 5–15)
BUN: 39 mg/dL — AB (ref 6–23)
CHLORIDE: 106 meq/L (ref 96–112)
CO2: 19 meq/L (ref 19–32)
Calcium: 9.1 mg/dL (ref 8.4–10.5)
Creatinine, Ser: 1.59 mg/dL — ABNORMAL HIGH (ref 0.50–1.35)
GFR calc non Af Amer: 45 mL/min — ABNORMAL LOW (ref >90)
GFR, EST AFRICAN AMERICAN: 52 mL/min — AB (ref >90)
Glucose, Bld: 93 mg/dL (ref 70–99)
Potassium: 5.1 mEq/L (ref 3.7–5.3)
Sodium: 139 mEq/L (ref 137–147)

## 2014-01-29 LAB — TSH: TSH: 1.98 u[IU]/mL (ref 0.350–4.500)

## 2014-01-29 LAB — MAGNESIUM: MAGNESIUM: 1.9 mg/dL (ref 1.5–2.5)

## 2014-01-29 LAB — I-STAT TROPONIN, ED: TROPONIN I, POC: 0.01 ng/mL (ref 0.00–0.08)

## 2014-01-29 LAB — TROPONIN I: Troponin I: <0.3 ng/mL (ref <0.30)

## 2014-01-29 MED ORDER — ACETAMINOPHEN 325 MG PO TABS
650.0000 mg | ORAL_TABLET | ORAL | Status: DC | PRN
  Administered 2014-01-30: 650 mg via ORAL
  Filled 2014-01-29: qty 2

## 2014-01-29 MED ORDER — ONDANSETRON HCL 4 MG/2ML IJ SOLN: 4.0000 mg | Freq: Four times a day (QID) | INTRAMUSCULAR | Status: DC | PRN

## 2014-01-29 MED ORDER — SODIUM CHLORIDE 0.9 % IJ SOLN: 3.0000 mL | INTRAMUSCULAR | Status: DC | PRN

## 2014-01-29 MED ORDER — HEPARIN SODIUM (PORCINE) 5000 UNIT/ML IJ SOLN
5000.0000 [IU] | Freq: Three times a day (TID) | INTRAMUSCULAR | Status: DC
Start: 2014-01-29 — End: 2014-02-01
  Administered 2014-01-29 – 2014-02-01 (×8): 5000 [IU] via SUBCUTANEOUS
  Filled 2014-01-29 (×11): qty 1

## 2014-01-29 MED ORDER — FUROSEMIDE 10 MG/ML IJ SOLN
80.0000 mg | INTRAMUSCULAR | Status: AC
  Administered 2014-01-29: 80 mg via INTRAVENOUS
  Filled 2014-01-29: qty 8

## 2014-01-29 MED ORDER — FUROSEMIDE 10 MG/ML IJ SOLN
40.0000 mg | Freq: Once | INTRAMUSCULAR | Status: AC
Start: 2014-01-29 — End: 2014-01-29
  Administered 2014-01-29: 40 mg via INTRAVENOUS
  Filled 2014-01-29: qty 4

## 2014-01-29 MED ORDER — LORATADINE 10 MG PO TABS
10.0000 mg | ORAL_TABLET | Freq: Every day | ORAL | Status: DC
  Administered 2014-01-29 – 2014-02-01 (×4): 10 mg via ORAL
  Filled 2014-01-29 (×4): qty 1

## 2014-01-29 MED ORDER — SODIUM CHLORIDE 0.9 % IJ SOLN
3.0000 mL | Freq: Two times a day (BID) | INTRAMUSCULAR | Status: DC
  Administered 2014-01-29 – 2014-02-01 (×6): 3 mL via INTRAVENOUS

## 2014-01-29 MED ORDER — SODIUM CHLORIDE 0.9 % IV SOLN: 250.0000 mL | INTRAVENOUS | Status: DC | PRN

## 2014-01-29 MED ORDER — SPIRONOLACTONE 50 MG PO TABS
50.0000 mg | ORAL_TABLET | Freq: Two times a day (BID) | ORAL | Status: DC
  Administered 2014-01-29 – 2014-02-01 (×6): 50 mg via ORAL
  Filled 2014-01-29 (×8): qty 1

## 2014-01-29 MED ORDER — PANTOPRAZOLE SODIUM 40 MG PO TBEC
40.0000 mg | DELAYED_RELEASE_TABLET | Freq: Every day | ORAL | Status: DC
  Administered 2014-01-29 – 2014-02-01 (×4): 40 mg via ORAL
  Filled 2014-01-29 (×4): qty 1

## 2014-01-29 MED ORDER — VITAMIN D3 25 MCG (1000 UNIT) PO TABS
2000.0000 [IU] | ORAL_TABLET | Freq: Every day | ORAL | Status: DC
  Administered 2014-01-29 – 2014-02-01 (×4): 2000 [IU] via ORAL
  Filled 2014-01-29 (×4): qty 2

## 2014-01-29 NOTE — ED Notes (Signed)
Pt sts retaining fluid and that he is sob, coughing, cant lay down and breath. Dr Moreen Fowler he seen today and sent him here to eval.

## 2014-01-29 NOTE — H&P (Signed)
The patient was seen in conjunction with Lorretta Harp, PA-C.  He appears to have acute on chronic diastolic heart failure substantiated by pulmonary edema on chest x-ray, recent documentation of normal left ventricular systolic function by echo, marked elevation of the neck veins, peripheral edema, and an elevated BNP (greater than 1000).  The patient's heart failure has features of right heart dysfunction which is likely contributed to by untreated obstructive sleep apnea (he has not been using CPAP for several weeks). Since he has retained fluid and become dyspneic, he feels that CPAP will cause him to smother. Additionally, he has a decline in renal function possibly related to decreased perfusion and hypoxia.  Plan:  1. IV Lasix  2. Hold losartan  3. Monitor renal function and if there is further decline, we may need to have renal consultation  4. Overall status appears to be deteriorating  5. Treat sleep apnea with CPAP/BiPAP while in hospital

## 2014-01-29 NOTE — H&P (Signed)
The patient was seen in conjunction with Lorretta Harp, PA-C.  He appears to have acute on chronic diastolic heart failure substantiated by pulmonary edema on chest x-ray, recent documentation of normal left ventricular systolic function by echo, marked elevation of the neck veins, peripheral edema, and an elevated BNP (greater than 1000).  The patient's heart failure has features of right heart dysfunction which is likely contributed to by untreated obstructive sleep apnea (he has not been using CPAP for several weeks). Since he has retained fluid and become dyspneic, he feels that CPAP will cause him to smother. Additionally, he has a decline in renal function possibly related to decreased perfusion and hypoxia.  Plan:  1. IV Lasix 2. Hold losartan 3. Monitor renal function and if there is further decline, we may need to have renal consultation 4. Overall status appears to be deteriorating 5. Treat sleep apnea with CPAP/BiPAP while in hospital

## 2014-01-29 NOTE — Progress Notes (Signed)
Patient arrived to unit from ED via stretcher. Patient alert, oriented and ambulatory. Admission weight, vitals and assessment completed. Fall and safety plan reviewed with patient. Patient placed on telemetry. He is in a Sinus Rhythm with bigeminy and trigeminy-which is the same as admission EKG done in the ED.  T.Turner text paged of patient's rhythm. Patient currently resting comfortably, call light within reach. Will continue to monitor. Blood pressure 118/41, pulse 58, temperature 97.4 F (36.3 C), temperature source Oral, resp. rate 17, height 6\' 1"  (1.854 m), weight 170.28 kg (375 lb 6.4 oz), SpO2 95.00%. Tresa Endo

## 2014-01-29 NOTE — H&P (Signed)
Patient ID: Peter Larson MRN: [id number], DOB/AGE: 12/02/1950   Admit date: 01/29/2014   Primary Physician: Namon Cirri Primary Cardiologist: Dr. Radford Pax   Pt. Profile:  Peter Larson is a 63 y.o. male with a history of midly dilated aortic root, diabetes mellitus, NASH followed at [facility], PVC's, OSA on CPAP, morbid obesity and HTN who presents to [facility] to with SOB, coughing and orthopnea and found to have acute on chronic diastolic CHF exacerbation.   The patient historically took Lasix, hydrochlorothiazide, and Aldactone but there was some concern for renal insufficiency in June of this year and he was taken off all diuretics. He then proceeded to become massively volume overloaded and have her shortness of breath. He was resumed on Lasix 20 mg once a day and Aldactone 100 mg once a day ( lower doses). He improved, but over the past 2 weeks he's noticed increasing shortness of breath, orthopnea to the point of sleeping in an armchair, PND, abdominal distention and lower extremity swelling up to his thighs. He does sometimes admit to mild chest tightness and becomes short of breath with exertion. He is generally not very active and very deconditioned. He and his wife went on Weight Watchers last year and lost over 50 pounds; however he has not been able to sustain this. His weight is up 45 pounds from a visit in March of 2015 with Dr. Radford Pax, but it is unclear whether this is all fluid or also fat. He tries to limit his salt and fluid intake and denies recent fevers, chills or, or infection. 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      Problem List  Past Medical History  Diagnosis Date  . Sleep apnea   . Liver disease     /fatty liver??early cirrhoisis--mild  . Splenomegaly     slt thrombocytopenia;no complics. egd 2/72 neg for varices, ?mild portal gastropathy; 02/2011 nl,novarices  . 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  Hypertension   . Adenomatous colon polyp '01 & '08  . Hemorrhoids     Severe anorectal pain and anal fissure w bleeding following hemorrhoidectomy may 2010  . Family history of colon cancer     mother --2  . Seasonal allergies   . [place] (hepatocellular carcinoma)     Screening CT neg 05/2008, U/S neg 04/2009,05/2010  . Dysphagia     BaS/tab neg 8/09; egd's neg for ring/strict--?dysmotility  . Chronic hepatitis   . Dilatation of aorta 05/2012    Mild by U/S    History reviewed. No pertinent past surgical history.   Allergies  No Known Allergies   Home Medications  Prior to Admission medications   Medication Sig Start Date End Date Taking? Authorizing Provider  cetirizine (ZYRTEC) 10 MG tablet Take 10 mg by mouth at bedtime.    Yes Historical Provider, MD  cholecalciferol (VITAMIN D) 1000 UNITS tablet Take 2,000 Units by mouth daily.   Yes Historical Provider, MD  esomeprazole (NEXIUM) 40 MG capsule Take 40 mg by mouth daily at 12 noon.   Yes Historical Provider, MD  furosemide (LASIX) 20 MG tablet Take 20 mg by mouth as needed (leg swelling).   Yes Historical Provider, MD  losartan-hydrochlorothiazide (HYZAAR) 100-25 MG per tablet Take 1 tablet by mouth daily.   Yes Historical Provider, MD  milk thistle 175 MG tablet Take 350 mg by mouth daily.   Yes Historical Provider, MD  Multiple Vitamin (MULTIVITAMIN) tablet Take 1 tablet by mouth daily.   Yes Historical Provider, MD  spironolactone (ALDACTONE) 50 MG tablet Take 50 mg by mouth.   Yes Historical Provider, MD    Family History  History reviewed. No pertinent family history. No family status information on file.     Social History  History   Social History  . Marital Status: Married    Spouse Name: N/A    Number of Children: N/A  . Years of Education: N/A   Occupational History  . Not on file.   Social History Main Topics  . Smoking status: Never Smoker   . Smokeless tobacco: Not on file  . Alcohol Use: No   . Drug Use: No  . Sexual Activity: Not on file   Other Topics Concern  . Not on file   Social History Narrative  . No narrative on file     All other systems reviewed and are otherwise negative except as noted above.  Physical Exam  Blood pressure 137/42, pulse 36, temperature 98.2 F (36.8 C), temperature source Oral, resp. rate 19, height 6\' 1"  (1.854 m), weight 380 lb (172.367 kg), SpO2 96.00%.  General: Pleasant, NAD. obese Psych: Normal affect. Neuro: Alert and oriented X 3. Moves all extremities spontaneously. HEENT: Normal  Neck: Supple without bruits or + JVD. Lungs:  Resp regular and unlabored, Crackles at bases.  Heart: RRR no s3, s4, or murmurs.  Abdomen: Soft, non-tender, non-distended, BS + x 4.  Extremities: No clubbing, cyanosis or pitting edema. Pretibial changes with chronic venous insufficiency.   Labs  No results found for this basename: CKTOTAL, CKMB, TROPONINI,  in the last 72 hours Lab Results  Component Value Date   WBC 3.9* 01/29/2014   HGB 10.9* 01/29/2014   HCT 32.0* 01/29/2014   MCV 98.8 01/29/2014   PLT 66* 01/29/2014    Recent Labs Lab 01/29/14 1646  NA 140  K 4.7  CL 108  BUN 34*  CREATININE 1.70*  GLUCOSE 87     Radiology/Studies  Dg Chest 2 View  01/29/2014   CLINICAL DATA:  Leg swelling.  Shortness of breath.  CHF.  EXAM: CHEST  2 VIEW  COMPARISON:  11/04/2013.  FINDINGS: Mild CHF is present. The cardiopericardial silhouette is within normal limits for projection. There is pulmonary vascular congestion. Interstitial pulmonary edema. Basilar atelectasis, most prominent at the costophrenic angles. Small bilateral pleural effusions.  IMPRESSION: Mild CHF.     2D ECHO: 01/13/2014 LV EF: 55% - 60% Study Conclusions - Left ventricle: The cavity size was normal. Wall thickness was increased in a pattern of moderate LVH. Systolic function was normal. The estimated ejection fraction was in the range of 55% to 60%. Wall motion was normal;  there were no regional wall motion abnormalities. Features are consistent with a pseudonormal left ventricular filling pattern, with concomitant abnormal relaxation and increased filling pressure (grade 2 diastolic dysfunction). - Aortic root: The aortic root was mildly dilated. - Mitral valve: Calcified annulus. There was moderate regurgitation. - Left atrium: The atrium was moderately dilated. - Right ventricle: The cavity size was mildly dilated. - Right atrium: The atrium was mildly dilated. - Pulmonary arteries: Systolic pressure was mildly increased. Impressions: - Normal LV function EF 55-60%; moderate LVH, grade 2 diastolic dysfunction; biatrial enlargement; mild RVE; moderate MR; mildly elevated pulmonary pressures.   ECG  Ventricular bigeminy HR 84  ASSESSMENT AND PLAN  JACION DISMORE is a 63 y.o. male with a  history of midly dilated aortic root, diabetes mellitus, NAFLD followed at [facility], PVC's, OSA on CPAP, obesity and HTN who presents to [facility] to with SOB, coughing and orthopnea and found to have acute on chronic diastolic CHF exacerbation.   Acute on chronic diastolic CHF-   -- ECHO 1/82/99: EF 55-60%; moderate LVH, G2DD, biatrial enlargement; mild RVE; moderate MR; mildly elevated, pulmonary pressures. -- BNP 1227, CXR with mild CHF. Weight up 45 lbs since office visit in 08/2013. 345-->380lbs -- Given 80mg  IV Lasix in the ED with some relief of SOB. Creat increased from 1.59-->1.7 after this dose -- Will give one more dose of IV Lasix 40mg  tonight and watch renal function. If his creat goes up tomorrow AM then will need nephrology consult.  -- Strict I/Os and daily weights.  -- Salt and fluid restriction.  HTN - well controlled -- On Hyzaar100/25 ( 1/2 pill a day). 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 Vent bigeminy today.   CDK- baseline creat around 1.3. Creat 1.7 today after 1 dose of IV Lasix 80mg . Need to watch this carefully  with diuresis.   Diabetes mellitus- well controlled. HgA1c 5.1 on 01/07/14 at [place]- well controlled without medications. TC 127; TG 146; HDL 21; LDL 127 on 01/07/14 at [facility].   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 Due to his obesity with BMI of 49.7, he will be a poor candidate for liver transplantation.   OSA on CPAP - continue CPAP -- Managed by Dr. Radford Pax  Morbid obesity- Body mass index is 50.15 kg/(m^2).  Anemia- on Iron supplementation.   Judy Pimple, PA-C 01/29/2014, 4:54 PM  Pager [phone]

## 2014-01-29 NOTE — ED Provider Notes (Signed)
Case discussed with cardiology PA Suanne Marker. She will evaluate the patient. Patient will be given 80 mg Lasix IV. VSS no resp distress. Patient will be admitted as per cardiology PA.  Monico Blitz, PA-C 01/29/14 1812

## 2014-01-29 NOTE — ED Provider Notes (Signed)
CSN: [id number]     Arrival date & time 01/29/14  1349 History   First MD Initiated Contact with Patient 01/29/14 1511     Chief Complaint  Patient presents with  . Leg Swelling  . Shortness of Breath  . Congestive Heart Failure    HPI Peter Larson is a 63 y.o. male with PMH of HTN, CHF, OSA,  complaining of increased shortness of breath, increased leg swelling and inability to lay flat. Patient denies chest pain. He normally has SOB but he cannot walk as far as he used to. Patient endorses chronic dry cough that is not productive. Patient seen by his PCP and sent here. Patient reports a 10 Ibs weight gain over the past week. He took 40mg  furosemide this morning. Patient followed by cardiology and last ECHO in 8/112015 with Normal LV function EF 55-60%. Eight weeks ago patient had decreased kidney function and his furosemide dose was decreased to 20mg  but he became fluid overloaded and gained 35Ibs. It was increased to 40mg  and he has been tolerating it well.    Past Medical History  Diagnosis Date  . Sleep apnea   . Liver disease     /fatty liver??early cirrhoisis--mild  . Splenomegaly     slt thrombocytopenia;no complics. egd 6/29 neg for varices, ?mild portal gastropathy; 02/2011 nl,novarices  . 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 Hypertension   . Adenomatous colon polyp '01 & '08  . Hemorrhoids     Severe anorectal pain and anal fissure w bleeding following hemorrhoidectomy may 2010  . Family history of colon cancer     mother --64  . Seasonal allergies   . [place] (hepatocellular carcinoma)     Screening CT neg 05/2008, U/S neg 04/2009,05/2010  . Dysphagia     BaS/tab neg 8/09; egd's neg for ring/strict--?dysmotility  . Chronic hepatitis   . Dilatation of aorta 05/2012    Mild by U/S   History reviewed. No pertinent past surgical history. History reviewed. No pertinent family history. History  Substance Use Topics  . Smoking status: Never Smoker   .  Smokeless tobacco: Not on file  . Alcohol Use: No    Review of Systems  Constitutional: Negative for fever and chills.  HENT: Negative for congestion and rhinorrhea.   Eyes: Negative for visual disturbance.  Respiratory: Positive for cough and shortness of breath.   Cardiovascular: Negative for chest pain and palpitations.  Gastrointestinal: Negative for nausea, vomiting and diarrhea.  Genitourinary: Negative for dysuria and hematuria.  Musculoskeletal: Negative for back pain and gait problem.  Skin: Negative for rash.  Neurological: Negative for weakness and headaches.      Allergies  Review of patient's allergies indicates no known allergies.  Home Medications   Prior to Admission medications   Medication Sig Start Date End Date Taking? Authorizing Provider  cetirizine (ZYRTEC) 10 MG tablet Take 10 mg by mouth at bedtime.    Yes Historical Provider, MD  cholecalciferol (VITAMIN D) 1000 UNITS tablet Take 2,000 Units by mouth daily.   Yes Historical Provider, MD  esomeprazole (NEXIUM) 40 MG capsule Take 40 mg by mouth daily at 12 noon.   Yes Historical Provider, MD  furosemide (LASIX) 20 MG tablet Take 20 mg by mouth as needed (leg swelling).   Yes Historical Provider, MD  losartan-hydrochlorothiazide (HYZAAR) 100-25 MG per tablet Take 1 tablet by mouth daily.   Yes Historical Provider, MD  milk thistle 175 MG tablet Take  350 mg by mouth daily.   Yes Historical Provider, MD  Multiple Vitamin (MULTIVITAMIN) tablet Take 1 tablet by mouth daily.   Yes Historical Provider, MD  spironolactone (ALDACTONE) 50 MG tablet Take 50 mg by mouth.   Yes Historical Provider, MD   BP 144/45  Pulse 122  Temp(Src) 98.2 F (36.8 C) (Oral)  Resp 24  Ht 6\' 1"  (1.854 m)  Wt 380 lb (172.367 kg)  BMI 50.15 kg/m2  SpO2 95% Physical Exam  Nursing note and vitals reviewed. Constitutional: He appears well-developed and well-nourished. No distress.  HENT:  Head: Normocephalic and atraumatic.   Eyes: Conjunctivae and EOM are normal. Right eye exhibits no discharge. Left eye exhibits no discharge. No scleral icterus.  Neck: No JVD present.  Cardiovascular: Normal rate, regular rhythm and normal heart sounds.   2+ pitting edema to bilateral knee  Pulmonary/Chest: He has no wheezes.  Increased work of breathing with accessory muscle use. Mild respiratory distress. Decreased breath sounds in bilateral bases.   Abdominal: Soft. Bowel sounds are normal. He exhibits no distension. There is no tenderness.  Musculoskeletal: Normal range of motion. He exhibits no tenderness.  Neurological: He is alert. He exhibits normal muscle tone. Coordination normal.  Skin: Skin is warm and dry. He is not diaphoretic.  Psychiatric: He has a normal mood and affect. His behavior is normal.    ED Course  Procedures (including critical care time) Labs Review Labs Reviewed  CBC - Abnormal; Notable for the following:    WBC 3.9 (*)    RBC 3.29 (*)    Hemoglobin 10.8 (*)    HCT 32.5 (*)    Platelets 66 (*)    All other components within normal limits  PRO B NATRIURETIC PEPTIDE - Abnormal; Notable for the following:    Pro B Natriuretic peptide (BNP) 1227.0 (*)    All other components within normal limits  BASIC METABOLIC PANEL  Randolm Idol, ED    Imaging Review Dg Chest 2 View  01/29/2014   CLINICAL DATA:  Leg swelling.  Shortness of breath.  CHF.  EXAM: CHEST  2 VIEW  COMPARISON:  11/04/2013.  FINDINGS: Mild CHF is present. The cardiopericardial silhouette is within normal limits for projection. There is pulmonary vascular congestion. Interstitial pulmonary edema. Basilar atelectasis, most prominent at the costophrenic angles. Small bilateral pleural effusions.  IMPRESSION: Mild CHF.   Electronically Signed   By: Dereck Ligas M.D.   On: 01/29/2014 15:08     EKG Interpretation   Date/Time:  Thursday January 29 2014 14:30:08 EDT Ventricular Rate:  84 PR Interval:    QRS Duration: 74 QT  Interval:  390 QTC Calculation: 460 R Axis:   36 Text Interpretation:  Sinus rhythm with bigeminy and trigeminy New from  prior Confirmed by [facility]  MD, [place] [phone]) on 01/29/2014 3:53:57 PM     Meds given in ED:  Medications  loratadine (CLARITIN) tablet 10 mg (10 mg Oral Given 01/29/14 2158)  cholecalciferol (VITAMIN D) tablet 2,000 Units (2,000 Units Oral Given 01/29/14 2159)  pantoprazole (PROTONIX) EC tablet 40 mg (40 mg Oral Given 01/29/14 2157)  spironolactone (ALDACTONE) tablet 50 mg (50 mg Oral Given 01/29/14 2158)  sodium chloride 0.9 % injection 3 mL (3 mLs Intravenous Given 01/29/14 2159)  sodium chloride 0.9 % injection 3 mL (not administered)  0.9 %  sodium chloride infusion (not administered)  acetaminophen (TYLENOL) tablet 650 mg (not administered)  ondansetron (ZOFRAN) injection 4 mg (not administered)  heparin injection  5,000 Units (5,000 Units Subcutaneous Given 01/29/14 2159)  furosemide (LASIX) injection 80 mg (80 mg Intravenous Given 01/29/14 1627)  furosemide (LASIX) injection 40 mg (40 mg Intravenous Given 01/29/14 1830)    Current Discharge Medication List        MDM   Final diagnoses:  CHF exacerbation   Patient is 63 year old male with PMH of DM, CHF, OSA, and NAFLD complaining of increased SOB, 10Ibs weight gain over 1 week and increased leg swelling. Patient afebrile, tachypnic with 95% O2 on room air. Patient with 2+ pitting edema to bilateral knees, no JVD and decreased lung sounds in bilateral bases. CXR with bilateral effusions and interstitial edema. BNP 1227. Normal troponin. Patient in mild respiratory distress with elevated BNP and weight gain. Patient is having a CHF exacerbation.  Patient given 80mg  lasix with improvement of SOB. Cardiology consult to [place] who plans to admit.    Discussed all results and patient verbalizes understanding and agrees with plan.  This is a shared patient. This patient was discussed with the physician, Dr. Evelina Bucy who saw and evaluated the patient.      Pura Spice, PA-C 01/30/14 [phone]

## 2014-01-29 NOTE — ED Provider Notes (Signed)
Medical screening examination/treatment/procedure(s) were performed by non-physician practitioner and as supervising physician I was immediately available for consultation/collaboration.   EKG Interpretation   Date/Time:  Thursday January 29 2014 14:30:08 EDT Ventricular Rate:  84 PR Interval:    QRS Duration: 74 QT Interval:  390 QTC Calculation: 460 R Axis:   36 Text Interpretation:  Sinus rhythm with bigeminy and trigeminy New from  prior Confirmed by [facility]  MD, [place] [phone]) on 01/29/2014 3:53:57 PM        Evelina Bucy, MD 01/29/14 2348

## 2014-01-30 DIAGNOSIS — K7460 Unspecified cirrhosis of liver: Secondary | ICD-10-CM | POA: Diagnosis present

## 2014-01-30 DIAGNOSIS — G4733 Obstructive sleep apnea (adult) (pediatric): Secondary | ICD-10-CM | POA: Diagnosis present

## 2014-01-30 DIAGNOSIS — Z86010 Personal history of colonic polyps: Secondary | ICD-10-CM | POA: Diagnosis not present

## 2014-01-30 DIAGNOSIS — Z6841 Body Mass Index (BMI) 40.0 and over, adult: Secondary | ICD-10-CM | POA: Diagnosis not present

## 2014-01-30 DIAGNOSIS — I77819 Aortic ectasia, unspecified site: Secondary | ICD-10-CM | POA: Diagnosis present

## 2014-01-30 DIAGNOSIS — I509 Heart failure, unspecified: Secondary | ICD-10-CM | POA: Diagnosis present

## 2014-01-30 DIAGNOSIS — R161 Splenomegaly, not elsewhere classified: Secondary | ICD-10-CM | POA: Diagnosis present

## 2014-01-30 DIAGNOSIS — I129 Hypertensive chronic kidney disease with stage 1 through stage 4 chronic kidney disease, or unspecified chronic kidney disease: Secondary | ICD-10-CM | POA: Diagnosis present

## 2014-01-30 DIAGNOSIS — N183 Chronic kidney disease, stage 3 unspecified: Secondary | ICD-10-CM | POA: Diagnosis present

## 2014-01-30 DIAGNOSIS — D61818 Other pancytopenia: Secondary | ICD-10-CM | POA: Diagnosis present

## 2014-01-30 DIAGNOSIS — I4949 Other premature depolarization: Secondary | ICD-10-CM | POA: Diagnosis present

## 2014-01-30 DIAGNOSIS — I5032 Chronic diastolic (congestive) heart failure: Secondary | ICD-10-CM | POA: Diagnosis present

## 2014-01-30 DIAGNOSIS — K7689 Other specified diseases of liver: Secondary | ICD-10-CM | POA: Diagnosis present

## 2014-01-30 DIAGNOSIS — D649 Anemia, unspecified: Secondary | ICD-10-CM | POA: Diagnosis present

## 2014-01-30 DIAGNOSIS — E119 Type 2 diabetes mellitus without complications: Secondary | ICD-10-CM | POA: Diagnosis present

## 2014-01-30 DIAGNOSIS — I5033 Acute on chronic diastolic (congestive) heart failure: Secondary | ICD-10-CM | POA: Diagnosis present

## 2014-01-30 LAB — BASIC METABOLIC PANEL
Anion gap: 11 (ref 5–15)
BUN: 39 mg/dL — AB (ref 6–23)
CO2: 23 mEq/L (ref 19–32)
CREATININE: 1.54 mg/dL — AB (ref 0.50–1.35)
Calcium: 9.4 mg/dL (ref 8.4–10.5)
Chloride: 106 mEq/L (ref 96–112)
GFR calc Af Amer: 54 mL/min — ABNORMAL LOW (ref >90)
GFR, EST NON AFRICAN AMERICAN: 47 mL/min — AB (ref >90)
GLUCOSE: 98 mg/dL (ref 70–99)
Potassium: 4.8 mEq/L (ref 3.7–5.3)
SODIUM: 140 meq/L (ref 137–147)

## 2014-01-30 LAB — TROPONIN I
Troponin I: <0.3 ng/mL (ref <0.30)
Troponin I: <0.3 ng/mL (ref <0.30)

## 2014-01-30 MED ORDER — FUROSEMIDE 10 MG/ML IJ SOLN
60.0000 mg | Freq: Two times a day (BID) | INTRAMUSCULAR | Status: DC
  Administered 2014-01-30 – 2014-01-31 (×4): 60 mg via INTRAVENOUS
  Filled 2014-01-30 (×5): qty 6

## 2014-01-30 MED ORDER — FUROSEMIDE 10 MG/ML IJ SOLN
INTRAMUSCULAR | Status: AC
  Administered 2014-01-30: 60 mg via INTRAVENOUS
  Filled 2014-01-30: qty 8

## 2014-01-30 NOTE — Progress Notes (Signed)
Pt refused CPAP at this time.

## 2014-01-30 NOTE — Progress Notes (Signed)
UR completed 

## 2014-01-30 NOTE — Progress Notes (Signed)
Patient Name: Peter Larson Date of Encounter: 01/30/2014     Principal Problem:   Acute on chronic diastolic congestive heart failure Active Problems:   OSA (obstructive sleep apnea)   Essential hypertension, benign   Obesity, unspecified   PVC (premature ventricular contraction)   SOB (shortness of breath)    SUBJECTIVE  Feeling better, less SOB overnight. However slept on bedside chair upright. States he has not slept on the bed in the last week. Denies any CP. No dizziness. Urinated well last night. He states he was SOB yesterday, therefore refused CPAP overnight, however willing to start CPAP tonight.  CURRENT MEDS . cholecalciferol  2,000 Units Oral Daily  . furosemide  60 mg Intravenous BID  . heparin  5,000 Units Subcutaneous 3 times per day  . loratadine  10 mg Oral Daily  . pantoprazole  40 mg Oral Daily  . sodium chloride  3 mL Intravenous Q12H  . spironolactone  50 mg Oral BID    OBJECTIVE  Filed Vitals:   01/29/14 1834 01/29/14 1942 01/30/14 0040 01/30/14 0456  BP: 126/50 118/41 122/40 121/44  Pulse:  58 81 78  Temp: 98.5 F (36.9 C) 97.4 F (36.3 C) 98.8 F (37.1 C) 98.6 F (37 C)  TempSrc: Oral Oral Oral Oral  Resp: 17  20 20   Height:  6\' 1"  (1.854 m)    Weight:  375 lb 6.4 oz (170.28 kg)  374 lb 9 oz (169.9 kg)  SpO2: 96% 95% 95% 92%    Intake/Output Summary (Last 24 hours) at 01/30/14 0803 Last data filed at 01/30/14 0458  Gross per 24 hour  Intake    360 ml  Output   3475 ml  Net  -3115 ml   Filed Weights   01/29/14 1437 01/29/14 1942 01/30/14 0456  Weight: 380 lb (172.367 kg) 375 lb 6.4 oz (170.28 kg) 374 lb 9 oz (169.9 kg)    PHYSICAL EXAM  General: Pleasant, NAD. Neuro: Alert and oriented X 3. Moves all extremities spontaneously. Psych: Normal affect. HEENT:  Normal  Neck: Supple without bruits or JVD. Lungs:  Resp regular and unlabored, mildly decrease breath sound, however no obvious rale, rhonchi or wheezing. Heart: RRR no  s3, s4, or murmurs. Abdomen: Soft, non-tender, BS + x 4. Pendulous, distended Extremities: No clubbing, cyanosis. DP/PT/Radials 2+ and equal bilaterally. Dry, 1+ pitting edema.  Accessory Clinical Findings  CBC  Recent Labs  01/29/14 1433 01/29/14 1646  WBC 3.9*  --   HGB 10.8* 10.9*  HCT 32.5* 32.0*  MCV 98.8  --   PLT 66*  --    Basic Metabolic Panel  Recent Labs  01/29/14 1433 01/29/14 1646 01/29/14 2050 01/30/14 0202  NA 139 140  --  140  K 5.1 4.7  --  4.8  CL 106 108  --  106  CO2 19  --   --  23  GLUCOSE 93 87  --  98  BUN 39* 34*  --  39*  CREATININE 1.59* 1.70*  --  1.54*  CALCIUM 9.1  --   --  9.4  MG  --   --  1.9  --    Cardiac Enzymes  Recent Labs  01/29/14 2050 01/30/14 0202  TROPONINI <0.30 <0.30   Thyroid Function Tests  Recent Labs  01/29/14 2050  TSH 1.980    TELE NSR with HR 70-80s, continue to have very frequent bigeminy and trigeminy through the night    ECG  NSR  with HR 80s, with bigeminy and trigeminy  Echocardiogram  01/13/2014 LV EF: 55% - 60%  ------------------------------------------------------------------- Indications: Shortness of breath 786.05.  ------------------------------------------------------------------- History: PMH: Acquired from the patient and from the patient&'s chart. PMH: Obstructive Sleep Apnea-CPAP. PVC&'s. Risk factors: Hypertension.  ------------------------------------------------------------------- Study Conclusions  - Left ventricle: The cavity size was normal. Wall thickness was increased in a pattern of moderate LVH. Systolic function was normal. The estimated ejection fraction was in the range of 55% to 60%. Wall motion was normal; there were no regional wall motion abnormalities. Features are consistent with a pseudonormal left ventricular filling pattern, with concomitant abnormal relaxation and increased filling pressure (grade 2 diastolic dysfunction). - Aortic root: The  aortic root was mildly dilated. - Mitral valve: Calcified annulus. There was moderate regurgitation. - Left atrium: The atrium was moderately dilated. - Right ventricle: The cavity size was mildly dilated. - Right atrium: The atrium was mildly dilated. - Pulmonary arteries: Systolic pressure was mildly increased.  Impressions:  - Normal LV function; moderate LVH, grade 2 diastolic dysfunction; biatrial enlargement; mild RVE; moderate MR; mildly elevated pulmonary pressures.       Radiology/Studies  Dg Chest 2 View  01/29/2014   CLINICAL DATA:  Leg swelling.  Shortness of breath.  CHF.  EXAM: CHEST  2 VIEW  COMPARISON:  11/04/2013.  FINDINGS: Mild CHF is present. The cardiopericardial silhouette is within normal limits for projection. There is pulmonary vascular congestion. Interstitial pulmonary edema. Basilar atelectasis, most prominent at the costophrenic angles. Small bilateral pleural effusions.  IMPRESSION: Mild CHF.   Electronically Signed   By: Dereck Ligas M.D.   On: 01/29/2014 15:08    ASSESSMENT AND PLAN  1. Acute on chronic diastolic HF  - SOB with abdominal distention and LE edema  - ECHO 01/13/14: EF 55-60%; moderate LVH, G2DD, biatrial enlargement; mild RVE; moderate MR; mildly elevated, pulmonary pressures  - sign of right heart failure 2/2 noncompliance with CPAP  - ?baseline weight, 345 lbs during Mar 2015 office visit. Now 380  - losartan held, received 80mg  + 40mg  IV lasix yesterday  - -3L down 6lbs since yesterday  - add 60mg  BID IV lasix for today and tomorrow, potentially transition back to PO lasix on Sun/Mon. Monitor renal function closely, daily BMET   2. HTN 3. PVCs 4. CKD: - Cr stable 1.7 --> 1.5 with diuresis 5. DM 6. HLD 7. NASH with cirrhosis: followed by [facility]  - poor liver transplant candidate with BMI 49.7 8. OSA noncompliant with CPAP  - continue to refuse CPAP overnight  - discussed with patient necessasity of CPAP, willing  to start CPAP tonight 9. Morbid obesity 10. Anemia  Signed, Almyra Deforest PA-C Pager: [phone]   History and all data above reviewed.  Patient examined.  I agree with the findings as above. He is breathing better today but not at baseline.  No painThe patient exam reveals COR:RRR  ,  Lungs: Decreased breath sounds but no obvious crackles  ,  Abd: Obese, Ext Severe edema  .  All available labs, radiology testing, previous records reviewed. Agree with documented assessment and plan. Anasarca:  Continue IV diuresis.  Needs to keep his feet elevated. Creat is stable.    Jeneen Rinks Zeppelin Commisso  8:15 AM  01/30/2014

## 2014-01-30 NOTE — ED Provider Notes (Signed)
Medical screening examination/treatment/procedure(s) were conducted as a shared visit with non-physician practitioner(s) and myself.  I personally evaluated the patient during the encounter.   EKG Interpretation   Date/Time:  Thursday January 29 2014 14:30:08 EDT Ventricular Rate:  84 PR Interval:    QRS Duration: 74 QT Interval:  390 QTC Calculation: 460 R Axis:   36 Text Interpretation:  Sinus rhythm with bigeminy and trigeminy New from  prior Confirmed by [facility]  MD, Chinle [id number]) on 01/29/2014 3:53:57 PM      Patient here with SOB, increased peripheral edema. BNP elevated. Lasix given, admitted to Cards.   Evelina Bucy, MD 01/30/14 2241

## 2014-01-31 DIAGNOSIS — I509 Heart failure, unspecified: Secondary | ICD-10-CM

## 2014-01-31 LAB — BASIC METABOLIC PANEL
ANION GAP: 11 (ref 5–15)
BUN: 40 mg/dL — ABNORMAL HIGH (ref 6–23)
CHLORIDE: 102 meq/L (ref 96–112)
CO2: 25 mEq/L (ref 19–32)
Calcium: 9.3 mg/dL (ref 8.4–10.5)
Creatinine, Ser: 1.59 mg/dL — ABNORMAL HIGH (ref 0.50–1.35)
GFR calc Af Amer: 52 mL/min — ABNORMAL LOW (ref >90)
GFR calc non Af Amer: 45 mL/min — ABNORMAL LOW (ref >90)
Glucose, Bld: 95 mg/dL (ref 70–99)
POTASSIUM: 4.7 meq/L (ref 3.7–5.3)
SODIUM: 138 meq/L (ref 137–147)

## 2014-01-31 NOTE — Progress Notes (Signed)
Pt refused CPAP at this time.

## 2014-01-31 NOTE — Progress Notes (Signed)
Patient refused CPAP tonight. There isnt  a machine in the room at this time. RN aware. Explained to Patient that if they changed their mind, to just have the RN call Respiratory and we would come set them up.

## 2014-01-31 NOTE — Progress Notes (Signed)
7903 . Sitting at bedside cardiac chair . No apparent distress Entertaining patient" family

## 2014-01-31 NOTE — Progress Notes (Signed)
Patient ID: Peter Larson, male   DOB: May 02, 1951, 63 y.o.   MRN: [id number]     Subjective:    SOB improving   Objective:   Temp:  [98.1 F (36.7 C)-99 F (37.2 C)] 98.1 F (36.7 C) (08/29 0443) Pulse Rate:  [72-86] 75 (08/29 0900) Resp:  [16-20] 16 (08/29 0900) BP: (116-130)/(41-85) 121/85 mmHg (08/29 0900) SpO2:  [92 %-95 %] 95 % (08/29 0900) Weight:  [369 lb 4.8 oz (167.513 kg)] 369 lb 4.8 oz (167.513 kg) (08/29 0443) Last BM Date: 01/30/14  Filed Weights   01/29/14 1942 01/30/14 0456 01/31/14 0443  Weight: 375 lb 6.4 oz (170.28 kg) 374 lb 9 oz (169.9 kg) 369 lb 4.8 oz (167.513 kg)    Intake/Output Summary (Last 24 hours) at 01/31/14 1011 Last data filed at 01/31/14 0100  Gross per 24 hour  Intake    700 ml  Output   3125 ml  Net  -2425 ml    Telemetry: NSR, PACs, PVCs  Exam:  General: NAD  Resp: CTAB  Cardiac: RRR, decreased heart sounds, no m/r/g. JVD just below angle of jaw  YQ:MVHQION soft, NT, ND  MSK: Trace bilateral edema  Neuro: no focal deficits  Psych: appropriate affect  Lab Results:  Basic Metabolic Panel:  Recent Labs Lab 01/29/14 1433 01/29/14 1646 01/29/14 2050 01/30/14 0202 01/31/14 0348  NA 139 140  --  140 138  K 5.1 4.7  --  4.8 4.7  CL 106 108  --  106 102  CO2 19  --   --  23 25  GLUCOSE 93 87  --  98 95  BUN 39* 34*  --  39* 40*  CREATININE 1.59* 1.70*  --  1.54* 1.59*  CALCIUM 9.1  --   --  9.4 9.3  MG  --   --  1.9  --   --     Liver Function Tests: No results found for this basename: AST, ALT, ALKPHOS, BILITOT, PROT, ALBUMIN,  in the last 168 hours  CBC:  Recent Labs Lab 01/29/14 1433 01/29/14 1646  WBC 3.9*  --   HGB 10.8* 10.9*  HCT 32.5* 32.0*  MCV 98.8  --   PLT 66*  --     Cardiac Enzymes:  Recent Labs Lab 01/29/14 2050 01/30/14 0202 01/30/14 0813  TROPONINI <0.30 <0.30 <0.30    BNP:  Recent Labs  01/29/14 1433  PROBNP 1227.0*    Coagulation: No results found for this  basename: INR,  in the last 168 hours  ECG:   Medications:   Scheduled Medications: . cholecalciferol  2,000 Units Oral Daily  . furosemide  60 mg Intravenous BID  . heparin  5,000 Units Subcutaneous 3 times per day  . loratadine  10 mg Oral Daily  . pantoprazole  40 mg Oral Daily  . sodium chloride  3 mL Intravenous Q12H  . spironolactone  50 mg Oral BID     Infusions:     PRN Medications:  sodium chloride, acetaminophen, ondansetron (ZOFRAN) IV, sodium chloride     Assessment/Plan   63 yo male  Hx of DM, NASH, PVCs, OSA on CPAP, HTN admitted with SOB and acute on chronic diastolic heart failure.  1. Acute on chronic diastolic heart failure - exacerbating factor appears to be being taken off home diuretics - echo 01/13/14 LVEF 55-60%, grade II diastolic dysfunction, mod MR  - 01/29/14 CXR mild pulm edema - negative 2.3 liters yesterday, total net negative 5.5  liters since admission. Cr remains overall stable, K 4.7. He is on lasix 60mg  IV bid and aldactone 50mg  bid - continue IV diuretics today, possible chang to oral tomorrow with possible discharge.   2. OSA - continue CPAP at night, though patient at times refuses    Carlyle Dolly, M.D., F.A.C.C.

## 2014-02-01 ENCOUNTER — Encounter (HOSPITAL_COMMUNITY): Payer: Self-pay | Admitting: Physician Assistant

## 2014-02-01 DIAGNOSIS — I5033 Acute on chronic diastolic (congestive) heart failure: Secondary | ICD-10-CM

## 2014-02-01 LAB — BASIC METABOLIC PANEL
ANION GAP: 13 (ref 5–15)
BUN: 37 mg/dL — ABNORMAL HIGH (ref 6–23)
CHLORIDE: 100 meq/L (ref 96–112)
CO2: 25 mEq/L (ref 19–32)
Calcium: 9.6 mg/dL (ref 8.4–10.5)
Creatinine, Ser: 1.6 mg/dL — ABNORMAL HIGH (ref 0.50–1.35)
GFR calc non Af Amer: 45 mL/min — ABNORMAL LOW (ref >90)
GFR, EST AFRICAN AMERICAN: 52 mL/min — AB (ref >90)
Glucose, Bld: 94 mg/dL (ref 70–99)
Potassium: 4.7 mEq/L (ref 3.7–5.3)
SODIUM: 138 meq/L (ref 137–147)

## 2014-02-01 MED ORDER — FUROSEMIDE 40 MG PO TABS
40.0000 mg | ORAL_TABLET | Freq: Two times a day (BID) | ORAL | Status: DC
  Administered 2014-02-01: 40 mg via ORAL
  Filled 2014-02-01 (×3): qty 1

## 2014-02-01 MED ORDER — SPIRONOLACTONE 50 MG PO TABS: 50.0000 mg | ORAL_TABLET | Freq: Two times a day (BID) | ORAL | Status: DC

## 2014-02-01 MED ORDER — FUROSEMIDE 40 MG PO TABS: 40.0000 mg | ORAL_TABLET | Freq: Two times a day (BID) | ORAL | Status: DC

## 2014-02-01 NOTE — Progress Notes (Signed)
All d/c instructions explained and given to pt.  Verbalized understanding. .  Prim Morace, RN. 

## 2014-02-01 NOTE — Discharge Summary (Signed)
Discharge Summary   Patient ID: Peter Larson MRN: [id number], DOB/AGE: 1951/05/04 63 y.o. Admit date: 01/29/2014 D/C date:     02/01/2014  Primary Care Provider: Namon Larson Primary Cardiologist: Dr. Radford Larson  Primary Discharge Diagnoses:  1. Acute on chronic diastolic heart failure 2. CKD stage III 3. OSA  4. Morbid obesity Body mass index is 47.64 kg/(m^2). 5. History of frequent PVCs 6. Pancytopenia 7. Cirrhosis due to NASH followed by [facility] 8. HTN  Secondary Discharge Diagnoses:  1. Splenomegaly slt thrombocytopenia;no complics. egd 2/68 neg for varices, ?mild portal gastropathy; 02/2011 nl,novarices  2. Diabetes mellitus  3. Adenomatous colon polyp '01 & '08   4. Hemorrhoids Severe anorectal pain and anal fissure w bleeding following hemorrhoidectomy may 2010   5. Seasonal allergies   6. Screening for [place] (hepatocellular carcinoma) - Screening CT neg 05/2008, U/S neg 04/2009,05/2010   7. Dysphagia BaS/tab neg 8/09; egd's neg for ring/strict--?dysmotility   8. Chronic hepatitis   9. Mild aortic root dilitation by echo 01/13/14  Hospital Course: Mr. Peter Larson is a 63 y.o. male with a history of midly dilated aortic root, diabetes mellitus, cirrhosis/NASH followed at [facility] (not felt to be a good transplant candidate due to severe obesity), PVC's, OSA on CPAP, DM, and HTN who presented to [facility] on 01/29/2014 with SOB, coughing, orthopnea and found to have acute on chronic diastolic CHF exacerbation. The patient historically took Lasix, hydrochlorothiazide, and Aldactone but there was some concern for renal insufficiency in June of this year and he was taken off all diuretics. He then proceeded to become massively volume overloaded and have shortness of breath. He was resumed on Lasix 20 mg once a day and Aldactone 100 mg once a day (lower doses). He improved, but over the past 2 weeks he's noticed increasing shortness of breath, orthopnea to the point of sleeping in an armchair, PND,  abdominal distention and lower extremity swelling up to his thighs. Recent echo 01/13/14 LVEF 55-60%, grade II diastolic dysfunction, mod MR, mildly elevated PA pressures. He did admit to sometimes having mild chest tightness and becoming SOB with exertion. He is generally inactive and very deconditioned. He and his wife went on Weight Watchers last year and he lost over 50 pounds; however gained half of it back. His weight on admission was up 45 pounds from a visit in March of 2015 with Dr. Radford Larson, but it was unclear whether this was all fluid or also fat. He reported limiting salt intake at home and denied recent fever, chills, infection. Cr was 1.7 and CXR c/w interstitial pulmonary edema. He was admitted for further diuresis. Troponins remained negative. Losartan/HCTZ was held and he was started on IV Lasix, setting at a dose of 60mg  IV BID. With diuresis, weight 380lb -> 361lb, - ~9L. He feels much better. He will go home on Lasix 40mg  BID and aldactone 50mg  BID. With diuresis, his Cr remained in the 1.5-1.6 range which may be his new normal. He did have occasional frequent PVCs on telemetry. Consideration might be given to low dose beta blocker initiation as outpatient for suppression given his h/o CHF - Dr. Harl Larson would like to defer this as outpatient. We have not resumed his losartan/HCTZ in light of increased dose of Lasix this admission and mildly elevated Cr. Compliance with CPAP was reinforced (he was warned about possibility of ending up on home oxygen as a result of OSA +/- OHS) and the patient said he knows he needs to get serious about  his weight now. He feels much better today without further symptoms. Dr. Harl Larson has seen and examined the patient today and feels he is stable for discharge. I have left a message on our office's scheduling voicemail requesting a follow-up appointment, and our office will call the patient with this appointment.  Of note, he also had evidence of pancytopenia this  admission with WBC 3.9, Hgb 10.8, plt 66 which appears chronic - CareEverywhere from 01/07/14 shows WBC 4, Hgb 11.6, Plt 71 so likely chronic, may be related to liver disease. Per [facility] office note, EGD 10/2013 without obvious cause for anemia, colonscopy 2008 with tubular adenomas, he has follow up with Dr. Cristina Larson to set up a colonoscopy coming up this year. He denied any sources of bleeding.   Discharge Vitals: Blood pressure 132/46, pulse 75, temperature 98 F (36.7 C), temperature source Oral, resp. rate 18, height 6\' 1"  (1.854 m), weight 361 lb (163.749 kg), SpO2 95.00%.  Labs: Lab Results  Component Value Date   WBC 3.9* 01/29/2014   HGB 10.9* 01/29/2014   HCT 32.0* 01/29/2014   MCV 98.8 01/29/2014   PLT 66* 01/29/2014    Recent Labs Lab 02/01/14 0453  NA 138  K 4.7  CL 100  CO2 25  BUN 37*  CREATININE 1.60*  CALCIUM 9.6  GLUCOSE 94    Recent Labs  01/29/14 2050 01/30/14 0202 01/30/14 0813  TROPONINI <0.30 <0.30 <0.30    Diagnostic Studies/Procedures   Dg Chest 2 View 01/29/2014   CLINICAL DATA:  Leg swelling.  Shortness of breath.  CHF.  EXAM: CHEST  2 VIEW  COMPARISON:  11/04/2013.  FINDINGS: Mild CHF is present. The cardiopericardial silhouette is within normal limits for projection. There is pulmonary vascular congestion. Interstitial pulmonary edema. Basilar atelectasis, most prominent at the costophrenic angles. Small bilateral pleural effusions.  IMPRESSION: Mild CHF.   Electronically Signed   By: Dereck Ligas M.D.   On: 01/29/2014 15:08    Discharge Medications   [facility] who has him on Milk Thistle.  Current Discharge Medication List    CONTINUE these medications which have CHANGED   Details  furosemide (LASIX) 40 MG tablet Take 1 tablet (40 mg total) by mouth 2 (two) times daily. Qty: 60 tablet, Refills: 6    spironolactone (ALDACTONE) 50 MG tablet Take 1 tablet (50 mg total) by mouth 2 (two) times daily. Qty: 60 tablet,  Refills: 6      CONTINUE these medications which have NOT CHANGED   Details  cetirizine (ZYRTEC) 10 MG tablet Take 10 mg by mouth at bedtime.     cholecalciferol (VITAMIN D) 1000 UNITS tablet Take 2,000 Units by mouth daily.    esomeprazole (NEXIUM) 40 MG capsule Take 40 mg by mouth daily at 12 noon.    milk thistle 175 MG tablet Take 350 mg by mouth daily.    Multiple Vitamin (MULTIVITAMIN) tablet Take 1 tablet by mouth daily.      STOP taking these medications     losartan-hydrochlorothiazide (HYZAAR) 100-25 MG per tablet         Disposition   The patient will be discharged in stable condition to home. Discharge Instructions   Diet - low sodium heart healthy    Complete by:  As directed      Increase activity slowly    Complete by:  As directed           Follow-up Information   Follow up with  Sueanne Margarita, MD. (Call office if you have not heard back in 3 days. Office will call you for your followup appointment.)    Specialty:  Cardiology   Contact information:   [address] [place] [address] [phone]         Duration of Discharge Encounter: Greater than 30 minutes including physician and PA time.  Signed, Melina Copa PA-C 02/01/2014, 9:14 AM

## 2014-02-01 NOTE — Progress Notes (Signed)
Patient: Peter Larson / Admit Date: 01/29/2014 / Date of Encounter: 02/01/2014, 8:01 AM   Subjective: Feeling so much better. Knows he needs to get serious about his weight and sleep apnea.   Objective: Telemetry: NSR, occ PVCs, bigeminy Physical Exam: Blood pressure 132/46, pulse 75, temperature 98 F (36.7 C), temperature source Oral, resp. rate 18, height 6\' 1"  (1.854 m), weight 361 lb (163.749 kg), SpO2 95.00%. General: Well developed, well nourished obese WM in no acute distress. Head: Normocephalic, atraumatic, sclera non-icteric, no xanthomas, nares are without discharge. Neck: JVP not elevated. Lungs: Clear bilaterally to auscultation without wheezes, rales, or rhonchi. Breathing is unlabored. Heart: RRR S1 S2 without murmurs, rubs, or gallops.  Abdomen: Soft, non-tender, non-distended, obese with normoactive bowel sounds. No rebound/guarding. Extremities: No clubbing or cyanosis. Tr LE edema.  Neuro: Alert and oriented X 3. Moves all extremities spontaneously. Psych:  Responds to questions appropriately with a normal affect.   Intake/Output Summary (Last 24 hours) at 02/01/14 0801 Last data filed at 02/01/14 1610  Gross per 24 hour  Intake    480 ml  Output   3601 ml  Net  -3121 ml    Inpatient Medications:  . cholecalciferol  2,000 Units Oral Daily  . furosemide  60 mg Intravenous BID  . heparin  5,000 Units Subcutaneous 3 times per day  . loratadine  10 mg Oral Daily  . pantoprazole  40 mg Oral Daily  . sodium chloride  3 mL Intravenous Q12H  . spironolactone  50 mg Oral BID   Infusions:    Labs:  Recent Labs  01/29/14 1646 01/29/14 2050  01/31/14 0348 02/01/14 0453  NA 140  --   < > 138 138  K 4.7  --   < > 4.7 4.7  CL 108  --   < > 102 100  CO2  --   --   < > 25 25  GLUCOSE 87  --   < > 95 94  BUN 34*  --   < > 40* 37*  CREATININE 1.70*  --   < > 1.59* 1.60*  CALCIUM  --   --   < > 9.3 9.6  MG  --  1.9  --   --   --   < > = values in this  interval not displayed. No results found for this basename: AST, ALT, ALKPHOS, BILITOT, PROT, ALBUMIN,  in the last 72 hours  Recent Labs  01/29/14 1433 01/29/14 1646  WBC 3.9*  --   HGB 10.8* 10.9*  HCT 32.5* 32.0*  MCV 98.8  --   PLT 66*  --     Recent Labs  01/29/14 2050 01/30/14 0202 01/30/14 0813  TROPONINI <0.30 <0.30 <0.30   No components found with this basename: POCBNP,  No results found for this basename: HGBA1C,  in the last 72 hours   Radiology/Studies:  Dg Chest 2 View  01/29/2014   CLINICAL DATA:  Leg swelling.  Shortness of breath.  CHF.  EXAM: CHEST  2 VIEW  COMPARISON:  11/04/2013.  FINDINGS: Mild CHF is present. The cardiopericardial silhouette is within normal limits for projection. There is pulmonary vascular congestion. Interstitial pulmonary edema. Basilar atelectasis, most prominent at the costophrenic angles. Small bilateral pleural effusions.  IMPRESSION: Mild CHF.   Electronically Signed   By: Dereck Ligas M.D.   On: 01/29/2014 15:08     Assessment and Plan  63 yo male Hx of DM, cirrhosis due to  NASH followed by [facility] with portal hypertension, PVCs, OSA on CPAP, HTN admitted with SOB and acute on chronic diastolic heart failure.   1. Acute on chronic diastolic heart failure alongside CKD stage III - exacerbating factor appears to be being taken off home diuretics due to renal insufficiency at [facility] - echo 01/13/14 LVEF 55-60%, grade II diastolic dysfunction, mod MR, mildly elevated PA pressures - 01/29/14 CXR mild pulm edema  - negative 3.5 liters yesterday, total net negative 9.3 liters since admission. Cr remains 1.5-1.6 range, K 4.7. He is on lasix 60mg  IV bid and aldactone 50mg  bid  - will discuss regimen for home with MD  2. OSA with morbid obesity Body mass index is 47.64 kg/(m^2). - continue CPAP at night, though patient at times refuses  - at risk for OHS  3. Frequent PVCs - consider low dose BB for suppression to improve  cardiac output  4. Pancytopenia, question due to cirrhosis/portal HTN/NASH - CareEverywhere from 01/07/14 shows WBC 4, Hgb 11.6, Plt 71 so likely chronic, may be related to liver disease - per [facility] note: EGD 10/2013 without obvious cause for anemia, colonscopy 2008 with tubular adenomas, he is coming due for colonoscopy with Dr. Cristina Gong this year - continue to f/u at [facility], Melina Copa PA-C

## 2014-02-02 ENCOUNTER — Telehealth: Payer: Self-pay | Admitting: Cardiology

## 2014-02-02 NOTE — Telephone Encounter (Signed)
New message      TCM appt on 02-13-14 at 10:45 per after hours vm, per [facility]

## 2014-02-02 NOTE — Telephone Encounter (Signed)
Patient contacted regarding discharge from [facility] on 02/01/14.  Patient understands to follow up with provider Melina Copa on 02/13/14 at 10:45 am at [facility]. Patient understands discharge instructions? yes Patient understands medications and regiment? yes Patient understands to bring all medications to this visit? yes  Pt states he is feeling much better. Pt state he is down 4 lbs, at his baseline weight. Pt states I walked up the street today with no complications. Pt states "I'm really taking my health serious this time, and would like to be referred to a dietician at the [facility] with Melina Copa PA-C.  Informed pt that if he has any other complications from now until f/u OV, then he should call our office at [phone] and ask to speak with a triage nurse.  Pt verbalized understanding and very pleased with all the assistance provided.

## 2014-02-04 ENCOUNTER — Telehealth: Payer: Self-pay | Admitting: Cardiology

## 2014-02-04 DIAGNOSIS — Z79899 Other long term (current) drug therapy: Secondary | ICD-10-CM

## 2014-02-04 NOTE — Telephone Encounter (Signed)
New message      Pt is having a lot of leg cramps---could this be coming from his medications.  This has been going every day since he came home from the hosp

## 2014-02-04 NOTE — Telephone Encounter (Signed)
To Dr Turner to advise 

## 2014-02-05 ENCOUNTER — Telehealth: Payer: Self-pay | Admitting: Cardiology

## 2014-02-05 NOTE — Telephone Encounter (Signed)
Left message to call back  

## 2014-02-05 NOTE — Telephone Encounter (Signed)
Follow Up  Pt returned call. Made appt for BMET 02/06/2014. Please put in orders. Please call pt to discuss why he should schedule with PCP

## 2014-02-05 NOTE — Telephone Encounter (Signed)
Labs scheduled and seeing PCP next week

## 2014-02-05 NOTE — Telephone Encounter (Signed)
Please have him come in for a BMET but also needs to set up an appt with his PCP

## 2014-02-05 NOTE — Telephone Encounter (Signed)
Patient is still in pain with leg cramps, he would like a call back. Please call and advise.

## 2014-02-05 NOTE — Telephone Encounter (Signed)
Patient seeing PCP next week

## 2014-02-05 NOTE — Telephone Encounter (Signed)
Sueanne Margarita, MD at 02/05/2014 1:57 PM     Status: Signed        Please have him come in for a BMET but also needs to set up an appt with his PCP

## 2014-02-05 NOTE — Telephone Encounter (Signed)
Patient needs to see PCP

## 2014-02-05 NOTE — Telephone Encounter (Addendum)
Patient did not receive a call back yesterday so he called again today. Complaining of leg cramps on and off and has actually had this problem since June. Yesterday he tried a few tablespoons of mustard which he said helped the problem.  A friend suggested this. Last BMET from 8/30 showed a potassium level of 4.7. Advised will send a message to Dr.Turner for advice to see if she may recommend trying Quinine Water.  Patient advised that he will get a call back again tomorrow.

## 2014-02-06 ENCOUNTER — Other Ambulatory Visit (INDEPENDENT_AMBULATORY_CARE_PROVIDER_SITE_OTHER): Payer: 59

## 2014-02-06 DIAGNOSIS — Z79899 Other long term (current) drug therapy: Secondary | ICD-10-CM

## 2014-02-06 LAB — BASIC METABOLIC PANEL
BUN: 37 mg/dL — ABNORMAL HIGH (ref 6–23)
CALCIUM: 9.3 mg/dL (ref 8.4–10.5)
CO2: 25 meq/L (ref 19–32)
CREATININE: 1.7 mg/dL — AB (ref 0.4–1.5)
Chloride: 101 mEq/L (ref 96–112)
GFR: 42.34 mL/min — AB (ref >60.00)
Glucose, Bld: 111 mg/dL — ABNORMAL HIGH (ref 70–99)
Potassium: 4.3 mEq/L (ref 3.5–5.1)
SODIUM: 135 meq/L (ref 135–145)

## 2014-02-10 ENCOUNTER — Encounter: Payer: Self-pay | Admitting: General Surgery

## 2014-02-12 ENCOUNTER — Emergency Department (HOSPITAL_COMMUNITY): Payer: 59

## 2014-02-12 ENCOUNTER — Inpatient Hospital Stay (HOSPITAL_COMMUNITY)
Admission: EM | Admit: 2014-02-12 | Discharge: 2014-02-13 | DRG: 441 | Disposition: A | Discharge status: Home / Self Care | Payer: 59 | Attending: Internal Medicine | Admitting: Internal Medicine

## 2014-02-12 ENCOUNTER — Encounter (HOSPITAL_COMMUNITY): Payer: Self-pay | Admitting: Emergency Medicine

## 2014-02-12 DIAGNOSIS — N183 Chronic kidney disease, stage 3 unspecified: Secondary | ICD-10-CM

## 2014-02-12 DIAGNOSIS — K7290 Hepatic failure, unspecified without coma: Secondary | ICD-10-CM | POA: Diagnosis not present

## 2014-02-12 DIAGNOSIS — I10 Essential (primary) hypertension: Secondary | ICD-10-CM

## 2014-02-12 DIAGNOSIS — G9340 Encephalopathy, unspecified: Secondary | ICD-10-CM | POA: Diagnosis present

## 2014-02-12 DIAGNOSIS — I5033 Acute on chronic diastolic (congestive) heart failure: Secondary | ICD-10-CM | POA: Diagnosis present

## 2014-02-12 DIAGNOSIS — E669 Obesity, unspecified: Secondary | ICD-10-CM

## 2014-02-12 DIAGNOSIS — D61818 Other pancytopenia: Secondary | ICD-10-CM | POA: Diagnosis present

## 2014-02-12 DIAGNOSIS — I509 Heart failure, unspecified: Secondary | ICD-10-CM | POA: Diagnosis present

## 2014-02-12 DIAGNOSIS — I4949 Other premature depolarization: Secondary | ICD-10-CM | POA: Diagnosis present

## 2014-02-12 DIAGNOSIS — K7581 Nonalcoholic steatohepatitis (NASH): Secondary | ICD-10-CM

## 2014-02-12 DIAGNOSIS — I129 Hypertensive chronic kidney disease with stage 1 through stage 4 chronic kidney disease, or unspecified chronic kidney disease: Secondary | ICD-10-CM | POA: Diagnosis present

## 2014-02-12 DIAGNOSIS — K7682 Hepatic encephalopathy: Secondary | ICD-10-CM | POA: Diagnosis present

## 2014-02-12 DIAGNOSIS — K769 Liver disease, unspecified: Secondary | ICD-10-CM | POA: Diagnosis present

## 2014-02-12 DIAGNOSIS — R7989 Other specified abnormal findings of blood chemistry: Secondary | ICD-10-CM

## 2014-02-12 DIAGNOSIS — G4733 Obstructive sleep apnea (adult) (pediatric): Secondary | ICD-10-CM | POA: Diagnosis present

## 2014-02-12 DIAGNOSIS — D649 Anemia, unspecified: Secondary | ICD-10-CM | POA: Diagnosis present

## 2014-02-12 DIAGNOSIS — K7689 Other specified diseases of liver: Secondary | ICD-10-CM

## 2014-02-12 DIAGNOSIS — K7460 Unspecified cirrhosis of liver: Secondary | ICD-10-CM | POA: Diagnosis present

## 2014-02-12 DIAGNOSIS — K739 Chronic hepatitis, unspecified: Secondary | ICD-10-CM | POA: Diagnosis present

## 2014-02-12 DIAGNOSIS — Z6841 Body Mass Index (BMI) 40.0 and over, adult: Secondary | ICD-10-CM

## 2014-02-12 DIAGNOSIS — E119 Type 2 diabetes mellitus without complications: Secondary | ICD-10-CM | POA: Diagnosis present

## 2014-02-12 DIAGNOSIS — R0602 Shortness of breath: Secondary | ICD-10-CM

## 2014-02-12 DIAGNOSIS — I493 Ventricular premature depolarization: Secondary | ICD-10-CM | POA: Diagnosis present

## 2014-02-12 DIAGNOSIS — R410 Disorientation, unspecified: Secondary | ICD-10-CM | POA: Diagnosis present

## 2014-02-12 DIAGNOSIS — I5032 Chronic diastolic (congestive) heart failure: Secondary | ICD-10-CM | POA: Diagnosis present

## 2014-02-12 DIAGNOSIS — K7469 Other cirrhosis of liver: Secondary | ICD-10-CM

## 2014-02-12 DIAGNOSIS — R0600 Dyspnea, unspecified: Secondary | ICD-10-CM

## 2014-02-12 HISTORY — DX: Unspecified osteoarthritis, unspecified site: M19.90

## 2014-02-12 HISTORY — DX: Shortness of breath: R06.02

## 2014-02-12 LAB — TROPONIN I

## 2014-02-12 LAB — URINALYSIS, ROUTINE W REFLEX MICROSCOPIC
BILIRUBIN URINE: NEGATIVE
Glucose, UA: NEGATIVE mg/dL
HGB URINE DIPSTICK: NEGATIVE
Ketones, ur: NEGATIVE mg/dL
LEUKOCYTES UA: NEGATIVE
Nitrite: NEGATIVE
PH: 6 (ref 5.0–8.0)
Protein, ur: NEGATIVE mg/dL
Specific Gravity, Urine: 1.02 (ref 1.005–1.030)
Urobilinogen, UA: 1 mg/dL (ref 0.0–1.0)

## 2014-02-12 LAB — CBC WITH DIFFERENTIAL/PLATELET
BASOS ABS: 0 10*3/uL (ref 0.0–0.1)
Basophils Relative: 0 % (ref 0–1)
EOS ABS: 0.2 10*3/uL (ref 0.0–0.7)
Eosinophils Relative: 4 % (ref 0–5)
HCT: 37.9 % — ABNORMAL LOW (ref 39.0–52.0)
Hemoglobin: 13.9 g/dL (ref 13.0–17.0)
LYMPHS PCT: 20 % (ref 12–46)
Lymphs Abs: 0.9 10*3/uL (ref 0.7–4.0)
MCH: 33.7 pg (ref 26.0–34.0)
MCHC: 36.7 g/dL — ABNORMAL HIGH (ref 30.0–36.0)
MCV: 91.8 fL (ref 78.0–100.0)
Monocytes Absolute: 1 10*3/uL (ref 0.1–1.0)
Monocytes Relative: 21 % — ABNORMAL HIGH (ref 3–12)
NEUTROS PCT: 54 % (ref 43–77)
Neutro Abs: 2.4 10*3/uL (ref 1.7–7.7)
PLATELETS: 75 10*3/uL — AB (ref 150–400)
RBC: 4.13 MIL/uL — ABNORMAL LOW (ref 4.22–5.81)
RDW: 13.8 % (ref 11.5–15.5)
WBC: 4.5 10*3/uL (ref 4.0–10.5)

## 2014-02-12 LAB — COMPREHENSIVE METABOLIC PANEL
ALT: 22 U/L (ref 0–53)
AST: 34 U/L (ref 0–37)
Albumin: 3.8 g/dL (ref 3.5–5.2)
Alkaline Phosphatase: 81 U/L (ref 39–117)
Anion gap: 16 — ABNORMAL HIGH (ref 5–15)
BUN: 44 mg/dL — ABNORMAL HIGH (ref 6–23)
CO2: 22 mEq/L (ref 19–32)
Calcium: 9.3 mg/dL (ref 8.4–10.5)
Chloride: 96 mEq/L (ref 96–112)
Creatinine, Ser: 1.97 mg/dL — ABNORMAL HIGH (ref 0.50–1.35)
GFR calc Af Amer: 40 mL/min — ABNORMAL LOW (ref >90)
GFR calc non Af Amer: 35 mL/min — ABNORMAL LOW (ref >90)
Glucose, Bld: 134 mg/dL — ABNORMAL HIGH (ref 70–99)
POTASSIUM: 4.2 meq/L (ref 3.7–5.3)
SODIUM: 134 meq/L — AB (ref 137–147)
TOTAL PROTEIN: 7.2 g/dL (ref 6.0–8.3)
Total Bilirubin: 1.5 mg/dL — ABNORMAL HIGH (ref 0.3–1.2)

## 2014-02-12 LAB — I-STAT ARTERIAL BLOOD GAS, ED
Acid-Base Excess: 1 mmol/L (ref 0.0–2.0)
BICARBONATE: 23.4 meq/L (ref 20.0–24.0)
O2 Saturation: 95 %
Patient temperature: 98.6
TCO2: 24 mmol/L (ref 0–100)
pCO2 arterial: 32 mmHg — ABNORMAL LOW (ref 35.0–45.0)
pH, Arterial: 7.472 — ABNORMAL HIGH (ref 7.350–7.450)
pO2, Arterial: 71 mmHg — ABNORMAL LOW (ref 80.0–100.0)

## 2014-02-12 LAB — I-STAT TROPONIN, ED: Troponin i, poc: 0.01 ng/mL (ref 0.00–0.08)

## 2014-02-12 LAB — AMMONIA: AMMONIA: 202 umol/L — AB (ref 11–60)

## 2014-02-12 LAB — GLUCOSE, CAPILLARY
GLUCOSE-CAPILLARY: 117 mg/dL — AB (ref 70–99)
GLUCOSE-CAPILLARY: 130 mg/dL — AB (ref 70–99)
Glucose-Capillary: 152 mg/dL — ABNORMAL HIGH (ref 70–99)

## 2014-02-12 LAB — PRO B NATRIURETIC PEPTIDE: Pro B Natriuretic peptide (BNP): 76.6 pg/mL (ref 0–125)

## 2014-02-12 MED ORDER — ADULT MULTIVITAMIN W/MINERALS CH
1.0000 | ORAL_TABLET | Freq: Every day | ORAL | Status: DC
  Administered 2014-02-12 – 2014-02-13 (×2): 1 via ORAL
  Filled 2014-02-12 (×2): qty 1

## 2014-02-12 MED ORDER — MILK THISTLE 175 MG PO TABS: 350.0000 mg | ORAL_TABLET | Freq: Every day | ORAL | Status: DC

## 2014-02-12 MED ORDER — LACTULOSE 10 GM/15ML PO SOLN
30.0000 g | Freq: Three times a day (TID) | ORAL | Status: AC
  Administered 2014-02-12 (×2): 30 g via ORAL
  Filled 2014-02-12 (×3): qty 45

## 2014-02-12 MED ORDER — ONDANSETRON HCL 4 MG/2ML IJ SOLN
4.0000 mg | Freq: Four times a day (QID) | INTRAMUSCULAR | Status: DC | PRN
  Administered 2014-02-12: 4 mg via INTRAVENOUS
  Filled 2014-02-12: qty 2

## 2014-02-12 MED ORDER — SODIUM CHLORIDE 0.9 % IV SOLN: 250.0000 mL | INTRAVENOUS | Status: DC | PRN

## 2014-02-12 MED ORDER — HEPARIN SODIUM (PORCINE) 5000 UNIT/ML IJ SOLN
5000.0000 [IU] | Freq: Three times a day (TID) | INTRAMUSCULAR | Status: DC
  Administered 2014-02-12 – 2014-02-13 (×3): 5000 [IU] via SUBCUTANEOUS
  Filled 2014-02-12 (×6): qty 1

## 2014-02-12 MED ORDER — SPIRONOLACTONE 50 MG PO TABS
50.0000 mg | ORAL_TABLET | Freq: Two times a day (BID) | ORAL | Status: DC
  Administered 2014-02-12 – 2014-02-13 (×3): 50 mg via ORAL
  Filled 2014-02-12 (×4): qty 1

## 2014-02-12 MED ORDER — SODIUM CHLORIDE 0.9 % IJ SOLN: 3.0000 mL | INTRAMUSCULAR | Status: DC | PRN

## 2014-02-12 MED ORDER — PANTOPRAZOLE SODIUM 40 MG PO TBEC
40.0000 mg | DELAYED_RELEASE_TABLET | Freq: Every day | ORAL | Status: DC
  Administered 2014-02-12 – 2014-02-13 (×2): 40 mg via ORAL
  Filled 2014-02-12 (×2): qty 1

## 2014-02-12 MED ORDER — RIFAXIMIN 550 MG PO TABS
550.0000 mg | ORAL_TABLET | Freq: Two times a day (BID) | ORAL | Status: DC
  Administered 2014-02-12 – 2014-02-13 (×3): 550 mg via ORAL
  Filled 2014-02-12 (×4): qty 1

## 2014-02-12 MED ORDER — LACTULOSE 10 GM/15ML PO SOLN
30.0000 g | Freq: Once | ORAL | Status: AC
  Administered 2014-02-12: 30 g via ORAL
  Filled 2014-02-12: qty 45

## 2014-02-12 MED ORDER — FUROSEMIDE 10 MG/ML IJ SOLN
80.0000 mg | Freq: Once | INTRAMUSCULAR | Status: AC
  Administered 2014-02-12: 80 mg via INTRAVENOUS
  Filled 2014-02-12: qty 8

## 2014-02-12 MED ORDER — SODIUM CHLORIDE 0.9 % IJ SOLN
3.0000 mL | Freq: Two times a day (BID) | INTRAMUSCULAR | Status: DC
  Administered 2014-02-12 – 2014-02-13 (×2): 3 mL via INTRAVENOUS

## 2014-02-12 MED ORDER — VITAMIN D3 25 MCG (1000 UNIT) PO TABS
2000.0000 [IU] | ORAL_TABLET | Freq: Every day | ORAL | Status: DC
  Administered 2014-02-12 – 2014-02-13 (×2): 2000 [IU] via ORAL
  Filled 2014-02-12 (×2): qty 2

## 2014-02-12 MED ORDER — SODIUM CHLORIDE 0.9 % IJ SOLN
3.0000 mL | Freq: Two times a day (BID) | INTRAMUSCULAR | Status: DC
  Administered 2014-02-12 – 2014-02-13 (×3): 3 mL via INTRAVENOUS

## 2014-02-12 MED ORDER — FUROSEMIDE 40 MG PO TABS
40.0000 mg | ORAL_TABLET | Freq: Two times a day (BID) | ORAL | Status: DC
  Administered 2014-02-12 – 2014-02-13 (×2): 40 mg via ORAL
  Filled 2014-02-12 (×4): qty 1

## 2014-02-12 NOTE — H&P (Addendum)
[facility] History and Physical  Peter Larson NWG:[id number] DOB: October 02, 1950 DOA: 02/12/2014  Referring physician: ED physician PCP: Namon Cirri  Specialists:   Chief Complaint: Shortness of breath and confusion  HPI: Peter Larson is a 63 y.o. male  with a history of mild dilated aortic root, diabetes mellitus, dCHF, NASH and cirrhosis followed at [facility], PVC's, OSA on CPAP, morbid obesity and HTN who presents to with SOB and confusion.   The patient was recently discharged from the hospital 10 days ago. He was treated for severely exacerbated diastolic congestive heart failure. He has been diuresed 50 pounds per patient's wife. After discharge from hospital, he has been compliant to his diuretics, including Lasix 40 mg twice a day and spironolactone 50 mg twice a day. His body weight decreased from a 380 LBS before last admission to yesterday 337 LBs per patient's wife. Patient reports that last night at about 10:00, he started feeling shortness of breath. He does not have fever, chills, chest pain or cough. Denies any tenderness over the calf area. He reports that he has been moving around at home.   In the past several days, patient become more sluggish, seems to be very confused. He has difficulty in understanding others per his wife. Of note,  patient has NASH/cirrhosis followed at [facility]. Per recent note from [facility], patient has a diagnosis of mild encephalopathy. He is taking lactulose, 30 mg daily. He Baldo Daub has good bowel movement every day. In the past 3-4 weeks, he was started with the iron supplement. He becomes constipated in the past several days. He does not have nausea, vomiting or abdominal pain. He was found to have pulmonary edema on chest x-ray, and elevated ammonia level at 202. He is admitted to inpatient.   Review of Systems: As presented in the history of presenting illness, rest negative.  Where does patient live?  With his wife  Can patient participate in  ADLs? barely  Allergy: No Known Allergies  Past Medical History  Diagnosis Date  . Sleep apnea   . Splenomegaly     slt thrombocytopenia;no complics. egd 5/78 neg for varices, ?mild portal gastropathy; 02/2011 nl,novarices  . 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 Hypertension   . Adenomatous colon polyp '01 & '08  . Hemorrhoids     Severe anorectal pain and anal fissure w bleeding following hemorrhoidectomy may 2010  . Family history of colon cancer     mother --90  . Seasonal allergies   . Screening     Hepatocellular Screening CT neg 05/2008, U/S neg 04/2009,05/2010  . Dysphagia     BaS/tab neg 8/09; egd's neg for ring/strict--?dysmotility  . Chronic hepatitis   . Dilatation of aorta     a. Mild aortic root dilitation by echo 01/13/14  . Chronic diastolic congestive heart failure     a.  ECHO 01/13/14: EF 55-60%; moderate LVH, G2DD, biatrial enlargement; mild RVE; moderate MR; mildly elevated, pulm pressures.  . CKD (chronic kidney disease), stage III   . OSA (obstructive sleep apnea)   . Morbid obesity   . Frequent PVCs   . Pancytopenia   . Cirrhosis     a. Due to NASH, followed by [facility].    Past Surgical History  Procedure Laterality Date  . Fissure surgery      Social History:  reports that he has never smoked. He does not have any smokeless tobacco history on file. He reports  that he does not drink alcohol or use illicit drugs.  Family History:  Family History  Problem Relation Age of Onset  . Cancer Mother     died of breast cancer  . Liver disease Father   . Thyroid disease Sister      Prior to Admission medications   Medication Sig Start Date End Date Taking? Authorizing Provider  cetirizine (ZYRTEC) 10 MG tablet Take 10 mg by mouth at bedtime.    Yes Historical Provider, MD  cholecalciferol (VITAMIN D) 1000 UNITS tablet Take 2,000 Units by mouth daily.   Yes Historical Provider, MD  esomeprazole (NEXIUM) 40 MG capsule Take 40  mg by mouth daily at 12 noon.   Yes Historical Provider, MD  furosemide (LASIX) 40 MG tablet Take 1 tablet (40 mg total) by mouth 2 (two) times daily. 02/01/14  Yes Dayna N Dunn, PA-C  lactulose (CHRONULAC) 10 GM/15ML solution Take 20 g by mouth daily.   Yes Historical Provider, MD  milk thistle 175 MG tablet Take 350 mg by mouth daily.   Yes Historical Provider, MD  Multiple Vitamin (MULTIVITAMIN) tablet Take 1 tablet by mouth daily.   Yes Historical Provider, MD  spironolactone (ALDACTONE) 50 MG tablet Take 1 tablet (50 mg total) by mouth 2 (two) times daily. 02/01/14  Yes Charlie Pitter, PA-C    Physical Exam: Filed Vitals:   02/12/14 0455 02/12/14 0530 02/12/14 0622 02/12/14 0625  BP: 100/70  160/73   Pulse: 86 87 84   Temp:    97.8 F (36.6 C)  TempSrc:      Resp: 16 18 19    Weight:      SpO2: 94% 93% 92%    General: Not in acute distress, mildly confused.  HEENT:       Eyes: PERRL, EOMI, no scleral icterus       ENT: No discharge from the ears and nose, no pharynx injection, no tonsillar enlargement.        Neck: Difficult to assess JVD due to body habitus, no bruit, no mass felt. Cardiac: S1/S2, irregular, No murmurs, gallops or rubs Pulm: Clear to auscultation bilaterally. No rales, wheezing, rhonchi or rubs. Abd: Soft, nondistended, nontender, no rebound pain, no organomegaly, BS present Ext: 1+ pitting leg edema. Venous insufficient changes in both legs,  2+DP/PT pulse bilaterally Musculoskeletal: No joint deformities, erythema, or stiffness, ROM full Skin: No rashes.  Neuro: mildly confused, but still oriented X3, cranial nerves II-XII grossly intact, muscle strength 5/5 in all extremeties, sensation to light touch intact. Brachial reflex 2+ bilaterally. Knee reflex 1+ bilaterally.  Psych: Patient is not psychotic, no suicidal or hemocidal ideation. Has asterixis.    Labs on Admission:  Basic Metabolic Panel:  Recent Labs Lab 02/06/14 0956 02/12/14 0141  NA 135 134*   K 4.3 4.2  CL 101 96  CO2 25 22  GLUCOSE 111* 134*  BUN 37* 44*  CREATININE 1.7* 1.97*  CALCIUM 9.3 9.3   Liver Function Tests:  Recent Labs Lab 02/12/14 0141  AST 34  ALT 22  ALKPHOS 81  BILITOT 1.5*  PROT 7.2  ALBUMIN 3.8   No results found for this basename: LIPASE, AMYLASE,  in the last 168 hours  Recent Labs Lab 02/12/14 0318  AMMONIA 202*   CBC:  Recent Labs Lab 02/12/14 0141  WBC 4.5  NEUTROABS 2.4  HGB 13.9  HCT 37.9*  MCV 91.8  PLT 75*   Cardiac Enzymes: No results found for this basename: CKTOTAL,  CKMB, CKMBINDEX, TROPONINI,  in the last 168 hours  BNP (last 3 results)  Recent Labs  01/29/14 1433 02/12/14 0141  PROBNP 1227.0* 76.6   CBG: No results found for this basename: GLUCAP,  in the last 168 hours  Radiological Exams on Admission: Dg Chest 2 View  02/12/2014   CLINICAL DATA:  Shortness of breath  EXAM: CHEST  2 VIEW  COMPARISON:  01/29/2014  FINDINGS: Cardiomegaly with pulmonary vascular congestion and possible mild interstitial edema. No pleural effusion or pneumothorax.  IMPRESSION: Cardiomegaly with pulmonary vascular congestion and possible mild interstitial edema.   Electronically Signed   By: Julian Hy M.D.   On: 02/12/2014 02:04   Ct Head Wo Contrast  02/12/2014   CLINICAL DATA:  Confusion.  EXAM: CT HEAD WITHOUT CONTRAST  TECHNIQUE: Contiguous axial images were obtained from the base of the skull through the vertex without intravenous contrast.  COMPARISON:  None.  FINDINGS: Ventricles and sulci appear symmetrical. No mass effect or midline shift. No abnormal extra-axial fluid collections. Gray-white matter junctions are distinct. Basal cisterns are not effaced. No evidence of acute intracranial hemorrhage. No depressed skull fractures. Visualized paranasal sinuses and mastoid air cells are not opacified.  IMPRESSION: No acute intracranial abnormalities.   Electronically Signed   By: Lucienne Capers M.D.   On: 02/12/2014  03:48   Assessment/Plan Principal Problem:   Acute encephalopathy Active Problems:   OSA (obstructive sleep apnea)   Essential hypertension, benign   Acute on chronic diastolic congestive heart failure   CKD (chronic kidney disease), stage III   Frequent PVCs   Cirrhosis   Confusion   1. Acute encephalopathy: Patient's confusion is most likely caused by mild hepatic encephalopathy. He has a NAFLD/cirrhosis and a mildly encephalopathy. He's currently taking lactulose. Although patient is compliant to his regimen, he has been constipated due to recently started iron supplement. His ammonia was significantly elevated on admission though this can not be used for diagonsis, yet it is consistent with hepatic encephalopathy.  CT head is negative for acute abnormalities.  - will admit to tele bed - will increase lactulose from home dose of 20 mg daily, to 30 mg 3 times a day for one day; then re- assess after that. - follow up CMP  2. diastolic CHF: Patient's shortness of breath is likely due to mild exacerbation of diastolic congestive heart failure.  ECHO 01/13/14 showed EF 55-60% with G2DD. Patient was just discharged from the hospital 10 days ago. Patient does not seem to have pulmonary embolism given no signs of DVT, no tachycardia, no chest pain. Pneumonia is also unlikely given no fever, cough or chest pain. Although body weight did not come back from recent discharge, other causes, such as ischemia can not be completely ruled out, which may have contributed to his acute symptoms. Patient's proBNP is 76.6 which is not consistent with congestive heart failure exacerbation, however given his morbid obesity, proBNP can be falsely low.   - will give one dose of lasix 80 mg by IV and reassess in PM - monitor electrolytes and renal function by BMP - BMP and Mg at 2:00PM - Strict I/Os and daily weights.   - Salt and fluid restriction. - trop x 3  3. HTN: continue home meds except for lasix  changed as above.  4. CDK- baseline cre around 1.5 to 1.7. Creat 1.97 on admission which is slightly above the baseline - will need to watch this carefully with diuresis.   5.  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 Due to his obesity with BMI of 49.7, he will be a poor candidate for liver transplantation.   6. OSA on CPAP - continue CPAP  7. Anemia- on Iron supplementation.  DVT ppx: SQ Heparin   Code Status: Full code Family Communication: Yes, patient's wife at bed side Disposition Plan: Admit to inpatient  Ivor Costa [facility] Pager [phone]  If 7PM-7AM, please contact night-coverage [email or web address] Password [facility] 02/12/2014, 6:46 AM

## 2014-02-12 NOTE — Progress Notes (Signed)
Patient admitted after midnight. Chart reviewed. Patient examined. Breathing easier. Minimal stool. Still some asterixis. Add xifaxan. Increase activity. Monitor 24 hours. Possibly home tomorrow if stable.  Doree Barthel, MD [facility]

## 2014-02-12 NOTE — ED Notes (Signed)
Ambulated pt in the hall. O2 sat stayed at 95%

## 2014-02-12 NOTE — ED Provider Notes (Addendum)
CSN: [id number]     Arrival date & time 02/12/14  0034 History   First MD Initiated Contact with Patient 02/12/14 0106     Chief Complaint  Patient presents with  . Shortness of Breath     (Consider location/radiation/quality/duration/timing/severity/associated sxs/prior Treatment) HPI 63 year old male presents emergency apartment from home with complaint of shortness of breath.  Patient was discharged from the hospital 10 days ago for CHF exacerbation.  He reports that his weight has continued to decrease.  No further pedal edema.  He has been watching his fluid intake and salt intake.  No missed medications. Around 11 pm he became short of breath and nauseated.  Pt had on his CPAP and removed it as he felt he was not getting good air.  Pt with sat of 92% upon arrival.  No fever, cough, chest pain.  Wife and patient report increased confusion over the last two days as well.  Pt has had difficulty using ATM, some word finding problems, drifts off during conversations.  Pt has h/o cirrhosis due to NASH, has been compliant with his lactulose.    Past Medical History  Diagnosis Date  . Sleep apnea   . Splenomegaly     slt thrombocytopenia;no complics. egd 4/74 neg for varices, ?mild portal gastropathy; 02/2011 nl,novarices  . 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 Hypertension   . Adenomatous colon polyp '01 & '08  . Hemorrhoids     Severe anorectal pain and anal fissure w bleeding following hemorrhoidectomy may 2010  . Family history of colon cancer     mother --65  . Seasonal allergies   . Screening     Hepatocellular Screening CT neg 05/2008, U/S neg 04/2009,05/2010  . Dysphagia     BaS/tab neg 8/09; egd's neg for ring/strict--?dysmotility  . Chronic hepatitis   . Dilatation of aorta     a. Mild aortic root dilitation by echo 01/13/14  . Chronic diastolic congestive heart failure     a.  ECHO 01/13/14: EF 55-60%; moderate LVH, G2DD, biatrial enlargement; mild RVE;  moderate MR; mildly elevated, pulm pressures.  . CKD (chronic kidney disease), stage III   . OSA (obstructive sleep apnea)   . Morbid obesity   . Frequent PVCs   . Pancytopenia   . Cirrhosis     a. Due to NASH, followed by [facility].   Past Surgical History  Procedure Laterality Date  . Fissure surgery     No family history on file. History  Substance Use Topics  . Smoking status: Never Smoker   . Smokeless tobacco: Not on file  . Alcohol Use: No    Review of Systems   See History of Present Illness; otherwise all other systems are reviewed and negative  Allergies  Review of patient's allergies indicates no known allergies.  Home Medications   Prior to Admission medications   Medication Sig Start Date End Date Taking? Authorizing Provider  cetirizine (ZYRTEC) 10 MG tablet Take 10 mg by mouth at bedtime.    Yes Historical Provider, MD  cholecalciferol (VITAMIN D) 1000 UNITS tablet Take 2,000 Units by mouth daily.   Yes Historical Provider, MD  esomeprazole (NEXIUM) 40 MG capsule Take 40 mg by mouth daily at 12 noon.   Yes Historical Provider, MD  furosemide (LASIX) 40 MG tablet Take 1 tablet (40 mg total) by mouth 2 (two) times daily. 02/01/14  Yes Dayna N Dunn, PA-C  lactulose (CHRONULAC) 10 GM/15ML  solution Take 20 g by mouth daily.   Yes Historical Provider, MD  milk thistle 175 MG tablet Take 350 mg by mouth daily.   Yes Historical Provider, MD  Multiple Vitamin (MULTIVITAMIN) tablet Take 1 tablet by mouth daily.   Yes Historical Provider, MD  spironolactone (ALDACTONE) 50 MG tablet Take 1 tablet (50 mg total) by mouth 2 (two) times daily. 02/01/14  Yes Dayna N Dunn, PA-C   BP 108/62  Pulse 73  Temp(Src) 98.3 F (36.8 C) (Oral)  Resp 20  Wt 338 lb 7 oz (153.514 kg)  SpO2 94% Physical Exam  Nursing note and vitals reviewed. Constitutional: He is oriented to person, place, and time. He appears well-developed and well-nourished.  Obese male, no acute distress   HENT:  Head: Normocephalic and atraumatic.  Nose: Nose normal.  Mouth/Throat: Oropharynx is clear and moist.  Eyes: Conjunctivae and EOM are normal. Pupils are equal, round, and reactive to light.  Neck: Normal range of motion. Neck supple. No JVD present. No tracheal deviation present. No thyromegaly present.  Cardiovascular: Normal rate, regular rhythm, normal heart sounds and intact distal pulses.  Exam reveals no gallop and no friction rub.   No murmur heard. Pulmonary/Chest: Effort normal and breath sounds normal. No stridor. No respiratory distress. He has no wheezes. He has no rales. He exhibits no tenderness.  Distant breath sounds secondary to body habitus, but no noted rales or prolonged expiratory phase  Abdominal: Soft. Bowel sounds are normal. He exhibits no distension and no mass. There is no tenderness. There is no rebound and no guarding.  Musculoskeletal: Normal range of motion. He exhibits no edema and no tenderness.  Lymphadenopathy:    He has no cervical adenopathy.  Neurological: He is alert and oriented to person, place, and time. He displays normal reflexes. No cranial nerve deficit. He exhibits normal muscle tone. Coordination normal.  Skin: Skin is warm and dry. No rash noted. No erythema. No pallor.  Psychiatric: He has a normal mood and affect. His behavior is normal. Judgment and thought content normal.    ED Course  Procedures (including critical care time) Labs Review Labs Reviewed  CBC WITH DIFFERENTIAL - Abnormal; Notable for the following:    RBC 4.13 (*)    HCT 37.9 (*)    MCHC 36.7 (*)    Platelets 75 (*)    Monocytes Relative 21 (*)    All other components within normal limits  COMPREHENSIVE METABOLIC PANEL - Abnormal; Notable for the following:    Sodium 134 (*)    Glucose, Bld 134 (*)    BUN 44 (*)    Creatinine, Ser 1.97 (*)    Total Bilirubin 1.5 (*)    GFR calc non Af Amer 35 (*)    GFR calc Af Amer 40 (*)    Anion gap 16 (*)    All  other components within normal limits  AMMONIA - Abnormal; Notable for the following:    Ammonia 202 (*)    All other components within normal limits  I-STAT ARTERIAL BLOOD GAS, ED - Abnormal; Notable for the following:    pH, Arterial 7.472 (*)    pCO2 arterial 32.0 (*)    pO2, Arterial 71.0 (*)    All other components within normal limits  URINE CULTURE  PRO B NATRIURETIC PEPTIDE  URINALYSIS, ROUTINE W REFLEX MICROSCOPIC  I-STAT TROPOININ, ED    Imaging Review Dg Chest 2 View  02/12/2014   CLINICAL DATA:  Shortness of  breath  EXAM: CHEST  2 VIEW  COMPARISON:  01/29/2014  FINDINGS: Cardiomegaly with pulmonary vascular congestion and possible mild interstitial edema. No pleural effusion or pneumothorax.  IMPRESSION: Cardiomegaly with pulmonary vascular congestion and possible mild interstitial edema.   Electronically Signed   By: Julian Hy M.D.   On: 02/12/2014 02:04   Ct Head Wo Contrast  02/12/2014   CLINICAL DATA:  Confusion.  EXAM: CT HEAD WITHOUT CONTRAST  TECHNIQUE: Contiguous axial images were obtained from the base of the skull through the vertex without intravenous contrast.  COMPARISON:  None.  FINDINGS: Ventricles and sulci appear symmetrical. No mass effect or midline shift. No abnormal extra-axial fluid collections. Gray-white matter junctions are distinct. Basal cisterns are not effaced. No evidence of acute intracranial hemorrhage. No depressed skull fractures. Visualized paranasal sinuses and mastoid air cells are not opacified.  IMPRESSION: No acute intracranial abnormalities.   Electronically Signed   By: Lucienne Capers M.D.   On: 02/12/2014 03:48     EKG Interpretation   Date/Time:  Thursday February 12 2014 00:46:08 EDT Ventricular Rate:  89 PR Interval:  206 QRS Duration: 84 QT Interval:  408 QTC Calculation: 496 R Axis:   25 Text Interpretation:  Sinus rhythm with occasional Premature ventricular  complexes and Possible Premature atrial complexes  with Abberant conduction  Septal infarct , age undetermined Abnormal ECG No significant change since  last tracing Confirmed by Cedric Mcclaine  MD, Obryan Radu ([id number]) on 02/12/2014 3:09:11 AM      MDM   Final diagnoses:  Confusion  Increased ammonia level  Acute dyspnea  Liver cirrhosis secondary to NASH  OSA (obstructive sleep apnea)    63 year old male with dyspnea.  No signs of CHF exacerbation.  Patient does have mild drop in his P02 as well as PCO2.  BNP troponin and chest x-ray all improved from most recent admission.  Ammonia is elevated.  Discuss with hospitalist for admission for further workup and evaluation.    Kalman Drape, MD 02/12/14 [place], MD 02/12/14 [phone]

## 2014-02-12 NOTE — Progress Notes (Signed)
Utilization review completed. Elisabet Gutzmer, RN, BSN. 

## 2014-02-12 NOTE — Progress Notes (Signed)
PHARMACIST - PHYSICIAN ORDER COMMUNICATION  CONCERNING: P&T Medication Policy on Herbal Medications  DESCRIPTION:  This patient's order for:  Milk thistle 350mg    has been noted.  This product(s) is classified as an "herbal" or natural product. Due to a lack of definitive safety studies or FDA approval, nonstandard manufacturing practices, plus the potential risk of unknown drug-drug interactions while on inpatient medications, the Pharmacy and Therapeutics Committee does not permit the use of "herbal" or natural products of this type within [facility].   ACTION TAKEN: The pharmacy department is unable to verify this order at this time and your patient has been informed of this safety policy. Please reevaluate patient's clinical condition at discharge and address if the herbal or natural product(s) should be resumed at that time.

## 2014-02-12 NOTE — ED Notes (Signed)
Admitting physician at bedside

## 2014-02-12 NOTE — Care Management Note (Signed)
    Page 1 of 1   02/13/2014     10:28:46 AM CARE MANAGEMENT NOTE 02/13/2014  Patient:  Peter Larson, Peter Larson   Account Number:  [id number]  Date Initiated:  02/12/2014  Documentation initiated by:  AMERSON,JULIE  Subjective/Objective Assessment:   Pt adm on 02/12/14 with acute encephalopathy, CHF.  PTA, pt resides at home with spouse.     Action/Plan:   Will follow for dc needs as pt progresses.   Anticipated DC Date:  02/15/2014   Anticipated DC Plan:  [place]  CM consult  Medication Assistance      Choice offered to / List presented to:             Status of service:  Completed, signed off Medicare Important Message given?   (If response is "NO", the following Medicare IM given date fields will be blank) Date Medicare IM given:   Medicare IM given by:   Date Additional Medicare IM given:   Additional Medicare IM given by:    Discharge Disposition:    Per UR Regulation:  Reviewed for med. necessity/level of care/duration of stay  If discussed at Long Length of Stay Meetings, dates discussed:    Comments:  9/11 1026a debbie Rakeem Colley rn,bsn spoke w pt. he would like me to get him copay assist card for xifaxan 578m. found 10.00 per month card and left for pt. also left another copy in chart if one in room gets lost.

## 2014-02-12 NOTE — ED Notes (Addendum)
Pt. reports SOB while lying on bed this evening , denies cough or congestion , no chest pain , denies fever or chills. Pt. was hospitalized 2 weeks ago diagnosed with CHF exacerbation .

## 2014-02-12 NOTE — ED Notes (Signed)
Attempted report 

## 2014-02-13 ENCOUNTER — Encounter: Payer: 59 | Admitting: Physician Assistant

## 2014-02-13 DIAGNOSIS — G9340 Encephalopathy, unspecified: Secondary | ICD-10-CM

## 2014-02-13 DIAGNOSIS — K7460 Unspecified cirrhosis of liver: Secondary | ICD-10-CM

## 2014-02-13 DIAGNOSIS — E119 Type 2 diabetes mellitus without complications: Secondary | ICD-10-CM

## 2014-02-13 LAB — URINE CULTURE

## 2014-02-13 LAB — GLUCOSE, CAPILLARY
GLUCOSE-CAPILLARY: 145 mg/dL — AB (ref 70–99)
GLUCOSE-CAPILLARY: 127 mg/dL — AB (ref 70–99)

## 2014-02-13 LAB — CBC
HEMATOCRIT: 41.1 % (ref 39.0–52.0)
HEMOGLOBIN: 14.8 g/dL (ref 13.0–17.0)
MCH: 33 pg (ref 26.0–34.0)
MCHC: 36 g/dL (ref 30.0–36.0)
MCV: 91.7 fL (ref 78.0–100.0)
Platelets: 82 10*3/uL — ABNORMAL LOW (ref 150–400)
RBC: 4.48 MIL/uL (ref 4.22–5.81)
RDW: 13.8 % (ref 11.5–15.5)
WBC: 7 10*3/uL (ref 4.0–10.5)

## 2014-02-13 LAB — BASIC METABOLIC PANEL
ANION GAP: 20 — AB (ref 5–15)
BUN: 53 mg/dL — ABNORMAL HIGH (ref 6–23)
CHLORIDE: 97 meq/L (ref 96–112)
CO2: 19 meq/L (ref 19–32)
Calcium: 9.9 mg/dL (ref 8.4–10.5)
Creatinine, Ser: 2 mg/dL — ABNORMAL HIGH (ref 0.50–1.35)
GFR calc Af Amer: 39 mL/min — ABNORMAL LOW (ref >90)
GFR calc non Af Amer: 34 mL/min — ABNORMAL LOW (ref >90)
GLUCOSE: 147 mg/dL — AB (ref 70–99)
POTASSIUM: 3.9 meq/L (ref 3.7–5.3)
SODIUM: 136 meq/L — AB (ref 137–147)

## 2014-02-13 MED ORDER — LACTULOSE 10 GM/15ML PO SOLN: 30.0000 g | Freq: Three times a day (TID) | ORAL | Status: DC

## 2014-02-13 MED ORDER — RIFAXIMIN 550 MG PO TABS: 550.0000 mg | ORAL_TABLET | Freq: Two times a day (BID) | ORAL | Status: AC

## 2014-02-13 NOTE — Progress Notes (Signed)
Patient discharge home, via car wife at bedside, prescriptions given to patient and savings card, iv removed, patient showered, education given on new meds

## 2014-02-13 NOTE — Discharge Summary (Signed)
Physician Discharge Summary  Peter Larson OIN:[id number] DOB: 1951/06/01 DOA: 02/12/2014  PCP: [name], MD  Admit date: 02/12/2014 Discharge date: 02/13/2014  Time spent: 35 minutes  Recommendations for Outpatient Follow-up:  1. BMp 1 week 2. Titrate lactulose for 3 BMs/day  Discharge Diagnoses:  Principal Problem:   Acute encephalopathy Active Problems:   OSA (obstructive sleep apnea)   Essential hypertension, benign   Acute on chronic diastolic congestive heart failure   CKD (chronic kidney disease), stage III   Frequent PVCs   Cirrhosis   Confusion   Discharge Condition: improved  Diet recommendation: cardiac  Filed Weights   02/12/14 0039 02/12/14 0712 02/13/14 0558  Weight: 153.514 kg (338 lb 7 oz) 152.771 kg (336 lb 12.8 oz) 151.547 kg (334 lb 1.6 oz)    History of present illness:  Peter Larson is a 63 y.o. male with a history of mild dilated aortic root, diabetes mellitus, dCHF, NASH and cirrhosis followed at [facility], PVC's, OSA on CPAP, morbid obesity and HTN who presents to with SOB and confusion.  The patient was recently discharged from the hospital 10 days ago. He was treated for severely exacerbated diastolic congestive heart failure. He has been diuresed 50 pounds per patient's wife. After discharge from hospital, he has been compliant to his diuretics, including Lasix 40 mg twice a day and spironolactone 50 mg twice a day. His body weight decreased from a 380 LBS before last admission to yesterday 337 LBs per patient's wife. Patient reports that last night at about 10:00, he started feeling shortness of breath. He does not have fever, chills, chest pain or cough. Denies any tenderness over the calf area. He reports that he has been moving around at home.  In the past several days, patient become more sluggish, seems to be very confused. He has difficulty in understanding others per his wife. Of note, patient has NASH/cirrhosis followed at [facility]. Per recent  note from [place], patient has a diagnosis of mild encephalopathy. He is taking lactulose, 30 mg daily. He Baldo Daub has good bowel movement every day. In the past 3-4 weeks, he was started with the iron supplement. He becomes constipated in the past several days. He does not have nausea, vomiting or abdominal pain. He was found to have pulmonary edema on chest x-ray, and elevated ammonia level at 202. He is admitted to inpatient.    Hospital Course:  Hepatic encephalopathy- mental status improved- answering questions appropriately, xifaxin added to lactulose- lactulose to be titrated to 3 BMs per day- discussed with patient and wife;   Procedures:  none  Consultations:  none  Discharge Exam: Filed Vitals:   02/13/14 0558  BP: 139/53  Pulse: 61  Temp: 97.7 F (36.5 C)  Resp: 20    General: A+Ox3, NAD Cardiovascular: rrr Respiratory: clear  Discharge Instructions You were cared for by a hospitalist during your hospital stay. If you have any questions about your discharge medications or the care you received while you were in the hospital after you are discharged, you can call the unit and asked to speak with the hospitalist on call if the hospitalist that took care of you is not available. Once you are discharged, your primary care physician will handle any further medical issues. Please note that NO REFILLS for any discharge medications will be authorized once you are discharged, as it is imperative that you return to your primary care physician (or establish a relationship with a primary care physician if you do  not have one) for your aftercare needs so that they can reassess your need for medications and monitor your lab values.   Current Discharge Medication List    CONTINUE these medications which have NOT CHANGED   Details  cetirizine (ZYRTEC) 10 MG tablet Take 10 mg by mouth at bedtime.     cholecalciferol (VITAMIN D) 1000 UNITS tablet Take 2,000 Units by mouth daily.     esomeprazole (NEXIUM) 40 MG capsule Take 40 mg by mouth daily at 12 noon.    furosemide (LASIX) 40 MG tablet Take 1 tablet (40 mg total) by mouth 2 (two) times daily. Qty: 60 tablet, Refills: 6    lactulose (CHRONULAC) 10 GM/15ML solution Take 20 g by mouth daily.    milk thistle 175 MG tablet Take 350 mg by mouth daily.    Multiple Vitamin (MULTIVITAMIN) tablet Take 1 tablet by mouth daily.    spironolactone (ALDACTONE) 50 MG tablet Take 1 tablet (50 mg total) by mouth 2 (two) times daily. Qty: 60 tablet, Refills: 6       No Known Allergies    The results of significant diagnostics from this hospitalization (including imaging, microbiology, ancillary and laboratory) are listed below for reference.    Significant Diagnostic Studies: Dg Chest 2 View  02/12/2014   CLINICAL DATA:  Shortness of breath  EXAM: CHEST  2 VIEW  COMPARISON:  01/29/2014  FINDINGS: Cardiomegaly with pulmonary vascular congestion and possible mild interstitial edema. No pleural effusion or pneumothorax.  IMPRESSION: Cardiomegaly with pulmonary vascular congestion and possible mild interstitial edema.   Electronically Signed   By: Julian Hy M.D.   On: 02/12/2014 02:04   Dg Chest 2 View  01/29/2014   CLINICAL DATA:  Leg swelling.  Shortness of breath.  CHF.  EXAM: CHEST  2 VIEW  COMPARISON:  11/04/2013.  FINDINGS: Mild CHF is present. The cardiopericardial silhouette is within normal limits for projection. There is pulmonary vascular congestion. Interstitial pulmonary edema. Basilar atelectasis, most prominent at the costophrenic angles. Small bilateral pleural effusions.  IMPRESSION: Mild CHF.   Electronically Signed   By: Dereck Ligas M.D.   On: 01/29/2014 15:08   Ct Head Wo Contrast  02/12/2014   CLINICAL DATA:  Confusion.  EXAM: CT HEAD WITHOUT CONTRAST  TECHNIQUE: Contiguous axial images were obtained from the base of the skull through the vertex without intravenous contrast.  COMPARISON:  None.   FINDINGS: Ventricles and sulci appear symmetrical. No mass effect or midline shift. No abnormal extra-axial fluid collections. Gray-white matter junctions are distinct. Basal cisterns are not effaced. No evidence of acute intracranial hemorrhage. No depressed skull fractures. Visualized paranasal sinuses and mastoid air cells are not opacified.  IMPRESSION: No acute intracranial abnormalities.   Electronically Signed   By: Lucienne Capers M.D.   On: 02/12/2014 03:48    Microbiology: No results found for this or any previous visit (from the past 240 hour(s)).   Labs: Basic Metabolic Panel:  Recent Labs Lab 02/12/14 0141 02/13/14 0500  NA 134* 136*  K 4.2 3.9  CL 96 97  CO2 22 19  GLUCOSE 134* 147*  BUN 44* 53*  CREATININE 1.97* 2.00*  CALCIUM 9.3 9.9   Liver Function Tests:  Recent Labs Lab 02/12/14 0141  AST 34  ALT 22  ALKPHOS 81  BILITOT 1.5*  PROT 7.2  ALBUMIN 3.8   No results found for this basename: LIPASE, AMYLASE,  in the last 168 hours  Recent Labs Lab 02/12/14 0318  AMMONIA 202*   CBC:  Recent Labs Lab 02/12/14 0141 02/13/14 0500  WBC 4.5 7.0  NEUTROABS 2.4  --   HGB 13.9 14.8  HCT 37.9* 41.1  MCV 91.8 91.7  PLT 75* 82*   Cardiac Enzymes:  Recent Labs Lab 02/12/14 1040  TROPONINI <0.30   BNP: BNP (last 3 results)  Recent Labs  01/29/14 1433 02/12/14 0141  PROBNP 1227.0* 76.6   CBG:  Recent Labs Lab 02/12/14 1125 02/12/14 1655 02/12/14 2149 02/13/14 0638  GLUCAP 117* 152* 130* 145*       Signed:  Mccartney Chuba  [facility] 02/13/2014, 10:31 AM

## 2014-02-13 NOTE — Progress Notes (Signed)
Pt set-up and placed on CPAP (6-18 cm H2O Auto) via [facility].  Pt currently tolerating well.

## 2014-02-23 ENCOUNTER — Encounter (HOSPITAL_COMMUNITY): Payer: Self-pay | Admitting: Emergency Medicine

## 2014-02-23 ENCOUNTER — Observation Stay (HOSPITAL_COMMUNITY)
Admission: EM | Admit: 2014-02-23 | Discharge: 2014-02-24 | Disposition: A | Discharge status: Home / Self Care | Payer: 59 | Attending: Dermatology | Admitting: Dermatology

## 2014-02-23 ENCOUNTER — Observation Stay (HOSPITAL_COMMUNITY): Payer: 59

## 2014-02-23 DIAGNOSIS — E119 Type 2 diabetes mellitus without complications: Secondary | ICD-10-CM | POA: Insufficient documentation

## 2014-02-23 DIAGNOSIS — I77819 Aortic ectasia, unspecified site: Secondary | ICD-10-CM | POA: Diagnosis not present

## 2014-02-23 DIAGNOSIS — K649 Unspecified hemorrhoids: Secondary | ICD-10-CM | POA: Insufficient documentation

## 2014-02-23 DIAGNOSIS — R4182 Altered mental status, unspecified: Secondary | ICD-10-CM | POA: Diagnosis present

## 2014-02-23 DIAGNOSIS — R0602 Shortness of breath: Secondary | ICD-10-CM | POA: Diagnosis not present

## 2014-02-23 DIAGNOSIS — K739 Chronic hepatitis, unspecified: Secondary | ICD-10-CM | POA: Diagnosis not present

## 2014-02-23 DIAGNOSIS — I493 Ventricular premature depolarization: Secondary | ICD-10-CM

## 2014-02-23 DIAGNOSIS — F29 Unspecified psychosis not due to a substance or known physiological condition: Secondary | ICD-10-CM | POA: Insufficient documentation

## 2014-02-23 DIAGNOSIS — R410 Disorientation, unspecified: Secondary | ICD-10-CM

## 2014-02-23 DIAGNOSIS — I4949 Other premature depolarization: Secondary | ICD-10-CM | POA: Insufficient documentation

## 2014-02-23 DIAGNOSIS — G4733 Obstructive sleep apnea (adult) (pediatric): Secondary | ICD-10-CM | POA: Insufficient documentation

## 2014-02-23 DIAGNOSIS — I10 Essential (primary) hypertension: Secondary | ICD-10-CM

## 2014-02-23 DIAGNOSIS — M129 Arthropathy, unspecified: Secondary | ICD-10-CM | POA: Diagnosis not present

## 2014-02-23 DIAGNOSIS — I5033 Acute on chronic diastolic (congestive) heart failure: Secondary | ICD-10-CM | POA: Insufficient documentation

## 2014-02-23 DIAGNOSIS — G9340 Encephalopathy, unspecified: Secondary | ICD-10-CM

## 2014-02-23 DIAGNOSIS — E669 Obesity, unspecified: Secondary | ICD-10-CM

## 2014-02-23 DIAGNOSIS — Z860100 Personal history of colon polyps, unspecified: Secondary | ICD-10-CM | POA: Insufficient documentation

## 2014-02-23 DIAGNOSIS — K7469 Other cirrhosis of liver: Secondary | ICD-10-CM

## 2014-02-23 DIAGNOSIS — K7682 Hepatic encephalopathy: Principal | ICD-10-CM | POA: Diagnosis present

## 2014-02-23 DIAGNOSIS — Z79899 Other long term (current) drug therapy: Secondary | ICD-10-CM | POA: Diagnosis not present

## 2014-02-23 DIAGNOSIS — K7290 Hepatic failure, unspecified without coma: Secondary | ICD-10-CM | POA: Diagnosis not present

## 2014-02-23 DIAGNOSIS — K7460 Unspecified cirrhosis of liver: Secondary | ICD-10-CM | POA: Insufficient documentation

## 2014-02-23 DIAGNOSIS — I129 Hypertensive chronic kidney disease with stage 1 through stage 4 chronic kidney disease, or unspecified chronic kidney disease: Secondary | ICD-10-CM | POA: Insufficient documentation

## 2014-02-23 DIAGNOSIS — K7200 Acute and subacute hepatic failure without coma: Secondary | ICD-10-CM | POA: Diagnosis present

## 2014-02-23 DIAGNOSIS — N183 Chronic kidney disease, stage 3 unspecified: Secondary | ICD-10-CM | POA: Insufficient documentation

## 2014-02-23 DIAGNOSIS — I509 Heart failure, unspecified: Secondary | ICD-10-CM

## 2014-02-23 LAB — CBG MONITORING, ED: GLUCOSE-CAPILLARY: 93 mg/dL (ref 70–99)

## 2014-02-23 LAB — URINALYSIS, ROUTINE W REFLEX MICROSCOPIC
Bilirubin Urine: NEGATIVE
Glucose, UA: NEGATIVE mg/dL
Hgb urine dipstick: NEGATIVE
Ketones, ur: NEGATIVE mg/dL
Leukocytes, UA: NEGATIVE
Nitrite: NEGATIVE
PH: 6 (ref 5.0–8.0)
Protein, ur: NEGATIVE mg/dL
SPECIFIC GRAVITY, URINE: 1.018 (ref 1.005–1.030)
UROBILINOGEN UA: 1 mg/dL (ref 0.0–1.0)

## 2014-02-23 LAB — CBC WITH DIFFERENTIAL/PLATELET
BASOS PCT: 0 % (ref 0–1)
Basophils Absolute: 0 10*3/uL (ref 0.0–0.1)
EOS PCT: 4 % (ref 0–5)
Eosinophils Absolute: 0.2 10*3/uL (ref 0.0–0.7)
HEMATOCRIT: 39.9 % (ref 39.0–52.0)
Hemoglobin: 14.2 g/dL (ref 13.0–17.0)
Lymphocytes Relative: 19 % (ref 12–46)
Lymphs Abs: 0.9 10*3/uL (ref 0.7–4.0)
MCH: 32.9 pg (ref 26.0–34.0)
MCHC: 35.6 g/dL (ref 30.0–36.0)
MCV: 92.4 fL (ref 78.0–100.0)
MONO ABS: 0.9 10*3/uL (ref 0.1–1.0)
Monocytes Relative: 19 % — ABNORMAL HIGH (ref 3–12)
Neutro Abs: 2.7 10*3/uL (ref 1.7–7.7)
Neutrophils Relative %: 58 % (ref 43–77)
Platelets: 67 10*3/uL — ABNORMAL LOW (ref 150–400)
RBC: 4.32 MIL/uL (ref 4.22–5.81)
RDW: 13 % (ref 11.5–15.5)
WBC: 4.6 10*3/uL (ref 4.0–10.5)

## 2014-02-23 LAB — PROTIME-INR
INR: 1.27 (ref 0.00–1.49)
Prothrombin Time: 15.9 seconds — ABNORMAL HIGH (ref 11.6–15.2)

## 2014-02-23 LAB — COMPREHENSIVE METABOLIC PANEL
ALK PHOS: 86 U/L (ref 39–117)
ALT: 28 U/L (ref 0–53)
AST: 40 U/L — ABNORMAL HIGH (ref 0–37)
Albumin: 4 g/dL (ref 3.5–5.2)
Anion gap: 16 — ABNORMAL HIGH (ref 5–15)
BUN: 33 mg/dL — AB (ref 6–23)
CALCIUM: 9.9 mg/dL (ref 8.4–10.5)
CO2: 22 mEq/L (ref 19–32)
Chloride: 102 mEq/L (ref 96–112)
Creatinine, Ser: 1.5 mg/dL — ABNORMAL HIGH (ref 0.50–1.35)
GFR, EST AFRICAN AMERICAN: 56 mL/min — AB (ref >90)
GFR, EST NON AFRICAN AMERICAN: 48 mL/min — AB (ref >90)
Glucose, Bld: 94 mg/dL (ref 70–99)
Potassium: 4.4 mEq/L (ref 3.7–5.3)
Sodium: 140 mEq/L (ref 137–147)
TOTAL PROTEIN: 7.5 g/dL (ref 6.0–8.3)
Total Bilirubin: 1.5 mg/dL — ABNORMAL HIGH (ref 0.3–1.2)

## 2014-02-23 LAB — AMMONIA: AMMONIA: 125 umol/L — AB (ref 11–60)

## 2014-02-23 MED ORDER — FUROSEMIDE 40 MG PO TABS
40.0000 mg | ORAL_TABLET | Freq: Two times a day (BID) | ORAL | Status: DC
  Administered 2014-02-23 – 2014-02-24 (×2): 40 mg via ORAL
  Filled 2014-02-23 (×4): qty 1

## 2014-02-23 MED ORDER — PANTOPRAZOLE SODIUM 40 MG PO TBEC
40.0000 mg | DELAYED_RELEASE_TABLET | Freq: Every day | ORAL | Status: DC
  Administered 2014-02-23 – 2014-02-24 (×2): 40 mg via ORAL
  Filled 2014-02-23 (×2): qty 1

## 2014-02-23 MED ORDER — ONE-DAILY MULTI VITAMINS PO TABS: 1.0000 | ORAL_TABLET | Freq: Every day | ORAL | Status: DC

## 2014-02-23 MED ORDER — ONDANSETRON HCL 4 MG/2ML IJ SOLN: 4.0000 mg | Freq: Four times a day (QID) | INTRAMUSCULAR | Status: DC | PRN

## 2014-02-23 MED ORDER — RIFAXIMIN 550 MG PO TABS
550.0000 mg | ORAL_TABLET | Freq: Two times a day (BID) | ORAL | Status: DC
  Administered 2014-02-23 – 2014-02-24 (×2): 550 mg via ORAL
  Filled 2014-02-23 (×3): qty 1

## 2014-02-23 MED ORDER — LACTULOSE 10 GM/15ML PO SOLN
30.0000 g | Freq: Four times a day (QID) | ORAL | Status: DC
  Administered 2014-02-23 – 2014-02-24 (×3): 30 g via ORAL
  Filled 2014-02-23 (×6): qty 45

## 2014-02-23 MED ORDER — SIMETHICONE 80 MG PO CHEW
160.0000 mg | CHEWABLE_TABLET | Freq: Four times a day (QID) | ORAL | Status: DC | PRN
  Administered 2014-02-23: 160 mg via ORAL
  Filled 2014-02-23: qty 2

## 2014-02-23 MED ORDER — VITAMIN D3 25 MCG (1000 UNIT) PO TABS
2000.0000 [IU] | ORAL_TABLET | Freq: Every day | ORAL | Status: DC
  Administered 2014-02-24: 2000 [IU] via ORAL
  Filled 2014-02-23 (×2): qty 2

## 2014-02-23 MED ORDER — SODIUM CHLORIDE 0.9 % IJ SOLN
3.0000 mL | Freq: Two times a day (BID) | INTRAMUSCULAR | Status: DC
  Administered 2014-02-23 – 2014-02-24 (×2): 3 mL via INTRAVENOUS

## 2014-02-23 MED ORDER — ADULT MULTIVITAMIN W/MINERALS CH
1.0000 | ORAL_TABLET | Freq: Every day | ORAL | Status: DC
  Administered 2014-02-23 – 2014-02-24 (×2): 1 via ORAL
  Filled 2014-02-23 (×2): qty 1

## 2014-02-23 MED ORDER — ONDANSETRON HCL 4 MG PO TABS: 4.0000 mg | ORAL_TABLET | Freq: Four times a day (QID) | ORAL | Status: DC | PRN

## 2014-02-23 MED ORDER — SPIRONOLACTONE 50 MG PO TABS
50.0000 mg | ORAL_TABLET | Freq: Two times a day (BID) | ORAL | Status: DC
  Administered 2014-02-23 – 2014-02-24 (×2): 50 mg via ORAL
  Filled 2014-02-23 (×3): qty 1

## 2014-02-23 MED ORDER — LACTULOSE 10 GM/15ML PO SOLN
30.0000 g | Freq: Once | ORAL | Status: AC
  Administered 2014-02-23: 30 g via ORAL
  Filled 2014-02-23: qty 45

## 2014-02-23 MED ORDER — LORATADINE 10 MG PO TABS
10.0000 mg | ORAL_TABLET | Freq: Every day | ORAL | Status: DC
Start: 2014-02-23 — End: 2014-02-24
  Administered 2014-02-23 – 2014-02-24 (×2): 10 mg via ORAL
  Filled 2014-02-23 (×2): qty 1

## 2014-02-23 NOTE — ED Notes (Signed)
Admitting at bedside 

## 2014-02-23 NOTE — ED Notes (Signed)
Pt with recent hx of hepatic encephalopathy with treatment c/o increased confusion starting last night; per wife pt with difficulty with normal tasks

## 2014-02-23 NOTE — ED Notes (Signed)
POC CBG resulted 93

## 2014-02-23 NOTE — Progress Notes (Signed)
Spoke with pt regarding cpap.  Pt stated he is only going to be here one night and will be ok without it tonight.  Pt was advised that RT is available all night and encouraged him to call should he change his mind.

## 2014-02-23 NOTE — ED Provider Notes (Signed)
CSN: [id number]     Arrival date & time 02/23/14  1252 History   First MD Initiated Contact with Patient 02/23/14 1329     Chief Complaint  Patient presents with  . Altered Mental Status     (Consider location/radiation/quality/duration/timing/severity/associated sxs/prior Treatment) Patient is a 63 y.o. male presenting with altered mental status. The history is provided by the patient.  Altered Mental Status Presenting symptoms: confusion   Associated symptoms: no abdominal pain, no fever, no headaches, no rash and no vomiting   pt w hx chronic liver disease, hepatic encephalopathy, chf, c/o increasing confusion c/w prior period when ammonia level high.  He had been advised to take increased dose of lactulose, which he did, then diarrhea started, and he backed off.  Also states had been advised to take higher diuretic dose, and then was told cr 1.4, and to back down on diuretic dose. States compliant w meds, no other recent change in meds or doses. No recent febrile or viral illness. States wt as been relatively stable, w perhaps 1-2 lb wt decrease.  Breathing c/w baseline. No new orthopnea, pnd or increased leg edema. Eating normally. No nvd. No gu c/o.      Past Medical History  Diagnosis Date  . Sleep apnea   . Splenomegaly     slt thrombocytopenia;no complics. egd 8/93 neg for varices, ?mild portal gastropathy; 02/2011 nl,novarices  . 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 Hypertension   . Adenomatous colon polyp '01 & '08  . Hemorrhoids     Severe anorectal pain and anal fissure w bleeding following hemorrhoidectomy may 2010  . Family history of colon cancer     mother --2  . Seasonal allergies   . Screening     Hepatocellular Screening CT neg 05/2008, U/S neg 04/2009,05/2010  . Dysphagia     BaS/tab neg 8/09; egd's neg for ring/strict--?dysmotility  . Chronic hepatitis   . Dilatation of aorta     a. Mild aortic root dilitation by echo 01/13/14  . Chronic  diastolic congestive heart failure     a.  ECHO 01/13/14: EF 55-60%; moderate LVH, G2DD, biatrial enlargement; mild RVE; moderate MR; mildly elevated, pulm pressures.  . CKD (chronic kidney disease), stage III   . OSA (obstructive sleep apnea)   . Morbid obesity   . Frequent PVCs   . Pancytopenia   . Cirrhosis     a. Due to NASH, followed by [facility].  Marland Kitchen Shortness of breath   . Arthritis     RA  " ALL OVER "   Past Surgical History  Procedure Laterality Date  . Fissure surgery    . Hemorrhoid surgery     Family History  Problem Relation Age of Onset  . Cancer Mother     died of breast cancer  . Liver disease Father   . Thyroid disease Sister    History  Substance Use Topics  . Smoking status: Never Smoker   . Smokeless tobacco: Never Used  . Alcohol Use: No    Review of Systems  Constitutional: Negative for fever and chills.  HENT: Negative for sore throat.   Eyes: Negative for visual disturbance.  Respiratory: Negative for shortness of breath.   Cardiovascular: Negative for chest pain.  Gastrointestinal: Negative for vomiting, abdominal pain and diarrhea.  Endocrine: Negative for polyuria.  Genitourinary: Negative for dysuria and flank pain.  Musculoskeletal: Negative for back pain and neck pain.  Skin:  Negative for rash.  Neurological: Negative for headaches.  Hematological: Does not bruise/bleed easily.  Psychiatric/Behavioral: Positive for confusion.      Allergies  Review of patient's allergies indicates no known allergies.  Home Medications   Prior to Admission medications   Medication Sig Start Date End Date Taking? Authorizing Provider  cetirizine (ZYRTEC) 10 MG tablet Take 10 mg by mouth at bedtime.     Historical Provider, MD  cholecalciferol (VITAMIN D) 1000 UNITS tablet Take 2,000 Units by mouth daily.    Historical Provider, MD  esomeprazole (NEXIUM) 40 MG capsule Take 40 mg by mouth daily at 12 noon.    Historical Provider, MD   furosemide (LASIX) 40 MG tablet Take 1 tablet (40 mg total) by mouth 2 (two) times daily. 02/01/14   Dayna N Dunn, PA-C  lactulose (CHRONULAC) 10 GM/15ML solution Take 45 mLs (30 g total) by mouth 3 (three) times daily. 02/13/14   Geradine Girt, DO  milk thistle 175 MG tablet Take 350 mg by mouth daily.    Historical Provider, MD  Multiple Vitamin (MULTIVITAMIN) tablet Take 1 tablet by mouth daily.    Historical Provider, MD  rifaximin (XIFAXAN) 550 MG TABS tablet Take 1 tablet (550 mg total) by mouth 2 (two) times daily. 02/13/14   Geradine Girt, DO  spironolactone (ALDACTONE) 50 MG tablet Take 1 tablet (50 mg total) by mouth 2 (two) times daily. 02/01/14   Dayna N Dunn, PA-C   BP 117/39  Pulse 80  Temp(Src) 97.9 F (36.6 C) (Oral)  Resp 18  Ht 6\' 1"  (1.854 m)  Wt 333 lb 4.8 oz (151.184 kg)  BMI 43.98 kg/m2  SpO2 95% Physical Exam  Nursing note and vitals reviewed. Constitutional: He appears well-developed and well-nourished. No distress.  HENT:  Head: Atraumatic.  Mouth/Throat: Oropharynx is clear and moist.  Eyes: Conjunctivae are normal. Pupils are equal, round, and reactive to light. No scleral icterus.  Neck: Neck supple. No tracheal deviation present. No thyromegaly present.  Cardiovascular: Normal rate, regular rhythm, normal heart sounds and intact distal pulses.   Pulmonary/Chest: Effort normal and breath sounds normal. No accessory muscle usage. No respiratory distress.  Abdominal: Soft. Bowel sounds are normal. He exhibits no distension. There is no tenderness.  Morbidly obese  Musculoskeletal: Normal range of motion.  Mild bil ankle/lower leg edema.   Neurological: He is alert.  Alert, oriented to person/place. Mild confusion. Motor intact bil, stre 5/5.   Skin: Skin is warm and dry. He is not diaphoretic.  Psychiatric:  Alert, content.     ED Course  Procedures (including critical care time) Labs Review  Results for orders placed during the hospital encounter of  02/23/14  COMPREHENSIVE METABOLIC PANEL      Result Value Ref Range   Sodium 140  137 - 147 mEq/L   Potassium 4.4  3.7 - 5.3 mEq/L   Chloride 102  96 - 112 mEq/L   CO2 22  19 - 32 mEq/L   Glucose, Bld 94  70 - 99 mg/dL   BUN 33 (*) 6 - 23 mg/dL   Creatinine, Ser 1.50 (*) 0.50 - 1.35 mg/dL   Calcium 9.9  8.4 - 10.5 mg/dL   Total Protein 7.5  6.0 - 8.3 g/dL   Albumin 4.0  3.5 - 5.2 g/dL   AST 40 (*) 0 - 37 U/L   ALT 28  0 - 53 U/L   Alkaline Phosphatase 86  39 - 117 U/L  Total Bilirubin 1.5 (*) 0.3 - 1.2 mg/dL   GFR calc non Af Amer 48 (*) >90 mL/min   GFR calc Af Amer 56 (*) >90 mL/min   Anion gap 16 (*) 5 - 15  PROTIME-INR      Result Value Ref Range   Prothrombin Time 15.9 (*) 11.6 - 15.2 seconds   INR 1.27  0.00 - 1.49  CBC WITH DIFFERENTIAL      Result Value Ref Range   WBC 4.6  4.0 - 10.5 K/uL   RBC 4.32  4.22 - 5.81 MIL/uL   Hemoglobin 14.2  13.0 - 17.0 g/dL   HCT 39.9  39.0 - 52.0 %   MCV 92.4  78.0 - 100.0 fL   MCH 32.9  26.0 - 34.0 pg   MCHC 35.6  30.0 - 36.0 g/dL   RDW 13.0  11.5 - 15.5 %   Platelets PENDING  150 - 400 K/uL   Neutrophils Relative % 58  43 - 77 %   Neutro Abs 2.7  1.7 - 7.7 K/uL   Lymphocytes Relative 19  12 - 46 %   Lymphs Abs 0.9  0.7 - 4.0 K/uL   Monocytes Relative 19 (*) 3 - 12 %   Monocytes Absolute 0.9  0.1 - 1.0 K/uL   Eosinophils Relative 4  0 - 5 %   Eosinophils Absolute 0.2  0.0 - 0.7 K/uL   Basophils Relative 0  0 - 1 %   Basophils Absolute 0.0  0.0 - 0.1 K/uL  AMMONIA      Result Value Ref Range   Ammonia 125 (*) 11 - 60 umol/L  CBG MONITORING, ED      Result Value Ref Range   Glucose-Capillary 93  70 - 99 mg/dL   Comment 1 Documented in Chart     Comment 2 Notify RN     Dg Chest 2 View  02/12/2014   CLINICAL DATA:  Shortness of breath  EXAM: CHEST  2 VIEW  COMPARISON:  01/29/2014  FINDINGS: Cardiomegaly with pulmonary vascular congestion and possible mild interstitial edema. No pleural effusion or pneumothorax.  IMPRESSION:  Cardiomegaly with pulmonary vascular congestion and possible mild interstitial edema.   Electronically Signed   By: Julian Hy M.D.   On: 02/12/2014 02:04   Dg Chest 2 View  01/29/2014   CLINICAL DATA:  Leg swelling.  Shortness of breath.  CHF.  EXAM: CHEST  2 VIEW  COMPARISON:  11/04/2013.  FINDINGS: Mild CHF is present. The cardiopericardial silhouette is within normal limits for projection. There is pulmonary vascular congestion. Interstitial pulmonary edema. Basilar atelectasis, most prominent at the costophrenic angles. Small bilateral pleural effusions.  IMPRESSION: Mild CHF.   Electronically Signed   By: Dereck Ligas M.D.   On: 01/29/2014 15:08   Ct Head Wo Contrast  02/12/2014   CLINICAL DATA:  Confusion.  EXAM: CT HEAD WITHOUT CONTRAST  TECHNIQUE: Contiguous axial images were obtained from the base of the skull through the vertex without intravenous contrast.  COMPARISON:  None.  FINDINGS: Ventricles and sulci appear symmetrical. No mass effect or midline shift. No abnormal extra-axial fluid collections. Gray-white matter junctions are distinct. Basal cisterns are not effaced. No evidence of acute intracranial hemorrhage. No depressed skull fractures. Visualized paranasal sinuses and mastoid air cells are not opacified.  IMPRESSION: No acute intracranial abnormalities.   Electronically Signed   By: Lucienne Capers M.D.   On: 02/12/2014 03:48      MDM  Iv ns. Labs.  Reviewed nursing notes and prior charts for additional history.   Ammonia level markedly elevated.  Lactulose po.  Med service called to admit/obs.    Mirna Mires, MD 02/23/14 [phone]

## 2014-02-23 NOTE — H&P (Signed)
[facility] History and Physical  Peter Larson NLG:[id number] DOB: 29-Oct-1950 DOA: 02/23/2014  Referring physician:  PCP: [name], MD  Specialists:   Chief Complaint: confusion   HPI: Peter Larson is a 63 y.o. male with PMH of Pre DM, CHF, HTN, OSA on CPAP, NASH Cirrhosis f/u at [facility], morbid obesity presented with confusion for 2 days;  Pt was recently admitted for CHF exacerbation, hepatic encephalopathy; Pt was advised to take increased dose of lactulose, then diarrhea started, so he decreased lactulose for few days; He reports weight loss with diuretics > 50lb; denies acute SOB, no chest pain, but had some cough; no fever, no nausea, vomiting, denies abdominal pain;  -He denies focal neurological weakness, no change in vision, hearing, no dysarthria, no paraesthesia;      Review of Systems: The patient denies anorexia, fever, weight loss,, vision loss, decreased hearing, hoarseness, chest pain, syncope, dyspnea on exertion, peripheral edema, balance deficits, hemoptysis, abdominal pain, melena, hematochezia, severe indigestion/heartburn, hematuria, incontinence, genital sores, muscle weakness, suspicious skin lesions, transient blindness, difficulty walking, depression, unusual weight change, abnormal bleeding, enlarged lymph nodes, angioedema, and breast masses.    Past Medical History  Diagnosis Date  . Sleep apnea   . Splenomegaly     slt thrombocytopenia;no complics. egd 0/81 neg for varices, ?mild portal gastropathy; 02/2011 nl,novarices  . 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 Hypertension   . Adenomatous colon polyp '01 & '08  . Hemorrhoids     Severe anorectal pain and anal fissure w bleeding following hemorrhoidectomy may 2010  . Family history of colon cancer     mother --49  . Seasonal allergies   . Screening     Hepatocellular Screening CT neg 05/2008, U/S neg 04/2009,05/2010  . Dysphagia     BaS/tab neg 8/09; egd's neg for  ring/strict--?dysmotility  . Chronic hepatitis   . Dilatation of aorta     a. Mild aortic root dilitation by echo 01/13/14  . Chronic diastolic congestive heart failure     a.  ECHO 01/13/14: EF 55-60%; moderate LVH, G2DD, biatrial enlargement; mild RVE; moderate MR; mildly elevated, pulm pressures.  . CKD (chronic kidney disease), stage III   . OSA (obstructive sleep apnea)   . Morbid obesity   . Frequent PVCs   . Pancytopenia   . Cirrhosis     a. Due to NASH, followed by [facility].  Marland Kitchen Shortness of breath   . Arthritis     RA  " ALL OVER "   Past Surgical History  Procedure Laterality Date  . Fissure surgery    . Hemorrhoid surgery     Social History:  reports that he has never smoked. He has never used smokeless tobacco. He reports that he does not drink alcohol or use illicit drugs. Home;  where does patient live--home, ALF, SNF? and with whom if at home? Yes;  Can patient participate in ADLs?  No Known Allergies  Family History  Problem Relation Age of Onset  . Cancer Mother     died of breast cancer  . Liver disease Father   . Thyroid disease Sister     (be sure to complete)  Prior to Admission medications   Medication Sig Start Date End Date Taking? Authorizing Provider  cetirizine (ZYRTEC) 10 MG tablet Take 10 mg by mouth at bedtime.    Yes Historical Provider, MD  cholecalciferol (VITAMIN D) 1000 UNITS tablet Take 2,000 Units by  mouth daily.   Yes Historical Provider, MD  esomeprazole (NEXIUM) 40 MG capsule Take 40 mg by mouth daily at 12 noon.   Yes Historical Provider, MD  furosemide (LASIX) 40 MG tablet Take 1 tablet (40 mg total) by mouth 2 (two) times daily. 02/01/14  Yes Dayna N Dunn, PA-C  lactulose (CHRONULAC) 10 GM/15ML solution Take 45 mLs (30 g total) by mouth 3 (three) times daily. 02/13/14  Yes Jessica U Vann, DO  milk thistle 175 MG tablet Take 350 mg by mouth daily.   Yes Historical Provider, MD  Misc Natural Products (TART CHERRY ADVANCED PO)  Take 1 capsule by mouth daily.   Yes Historical Provider, MD  Multiple Vitamin (MULTIVITAMIN) tablet Take 1 tablet by mouth daily.   Yes Historical Provider, MD  spironolactone (ALDACTONE) 50 MG tablet Take 1 tablet (50 mg total) by mouth 2 (two) times daily. 02/01/14  Yes Dayna N Dunn, PA-C  rifaximin (XIFAXAN) 550 MG TABS tablet Take 1 tablet (550 mg total) by mouth 2 (two) times daily. 02/13/14   Geradine Girt, DO   Physical Exam: Filed Vitals:   02/23/14 1515  BP: 126/68  Pulse: 79  Temp:   Resp: 16     General:  Alert, oriented   Eyes: eom-i  ENT: no oral ulcers   Neck: supple,   Cardiovascular: s1,s2 rrr  Respiratory: few crackles at bases   Abdomen: soft, obese, nt, + bs  Skin: dermatitis LE  Musculoskeletal: LE mild edema  Psychiatric: no hallucinations   Neurologic: CN 2-12 intact, motor , sensation is intact   Labs on Admission:  Basic Metabolic Panel:  Recent Labs Lab 02/23/14 1307  NA 140  K 4.4  CL 102  CO2 22  GLUCOSE 94  BUN 33*  CREATININE 1.50*  CALCIUM 9.9   Liver Function Tests:  Recent Labs Lab 02/23/14 1307  AST 40*  ALT 28  ALKPHOS 86  BILITOT 1.5*  PROT 7.5  ALBUMIN 4.0   No results found for this basename: LIPASE, AMYLASE,  in the last 168 hours  Recent Labs Lab 02/23/14 1319  AMMONIA 125*   CBC:  Recent Labs Lab 02/23/14 1307  WBC 4.6  NEUTROABS 2.7  HGB 14.2  HCT 39.9  MCV 92.4  PLT 67*   Cardiac Enzymes: No results found for this basename: CKTOTAL, CKMB, CKMBINDEX, TROPONINI,  in the last 168 hours  BNP (last 3 results)  Recent Labs  01/29/14 1433 02/12/14 0141  PROBNP 1227.0* 76.6   CBG:  Recent Labs Lab 02/23/14 1326  GLUCAP 93    Radiological Exams on Admission: No results found.  EKG: Independently reviewed.   Assessment/Plan Principal Problem:   Hepatic encephalopathy Active Problems:   OSA (obstructive sleep apnea)   CKD (chronic kidney disease), stage III   63 y.o. male  with PMH of Pre DM, CHF, HTN, OSA on CPAP, CKD, NASH Cirrhosis f/u at [facility], morbid obesity presented with intermittent confusion for 2 days;  1. Mild hepatic encephalopathy; ammonia -125; afebrile, no leukocytosis, no s/s SBP; -increase lactulose, cont rifaximin; obtain UA, CXR r/o infection;   2. CHF chronic diastolic HF; echo (01/997): LVEF 60%, LVH, Pulmonary HTN, grade 2 diastolic dysfunction -clinically mild hypervolemic (CHF+cirrhosis); but no acute exacerbation -cont home diuretics; I/O, monitor renal function while on diuretics   3. NASH, cirrhosis, thrombocytopenia (no s/s of bleeding); outpatient f/u at [facility]  -recheck platelets in AM, INR 4. OSA on CPAP  None;  if consultant consulted, please  document name and whether formally or informally consulted  Code Status: full (must indicate code status--if unknown or must be presumed, indicate so) Family Communication:  D/w patient, his wife, daughter  (indicate person spoken with, if applicable, with phone number if by telephone) Disposition Plan: home 24-48 hrs  (indicate anticipated LOS)  Time spent: Natalia, [facility] Pager [phone]  If 7PM-7AM, please contact night-coverage [email or web address] Password [facility] 02/23/2014, 3:32 PM

## 2014-02-24 DIAGNOSIS — G4733 Obstructive sleep apnea (adult) (pediatric): Secondary | ICD-10-CM

## 2014-02-24 LAB — COMPREHENSIVE METABOLIC PANEL
ALBUMIN: 4 g/dL (ref 3.5–5.2)
ALK PHOS: 81 U/L (ref 39–117)
ALT: 27 U/L (ref 0–53)
ANION GAP: 16 — AB (ref 5–15)
AST: 39 U/L — ABNORMAL HIGH (ref 0–37)
BUN: 33 mg/dL — ABNORMAL HIGH (ref 6–23)
CALCIUM: 9.9 mg/dL (ref 8.4–10.5)
CO2: 22 mEq/L (ref 19–32)
Chloride: 99 mEq/L (ref 96–112)
Creatinine, Ser: 1.54 mg/dL — ABNORMAL HIGH (ref 0.50–1.35)
GFR calc Af Amer: 54 mL/min — ABNORMAL LOW (ref >90)
GFR calc non Af Amer: 47 mL/min — ABNORMAL LOW (ref >90)
Glucose, Bld: 128 mg/dL — ABNORMAL HIGH (ref 70–99)
POTASSIUM: 4.3 meq/L (ref 3.7–5.3)
SODIUM: 137 meq/L (ref 137–147)
Total Bilirubin: 2.3 mg/dL — ABNORMAL HIGH (ref 0.3–1.2)
Total Protein: 7.5 g/dL (ref 6.0–8.3)

## 2014-02-24 LAB — CBC
HEMATOCRIT: 41.3 % (ref 39.0–52.0)
HEMOGLOBIN: 14.7 g/dL (ref 13.0–17.0)
MCH: 32.5 pg (ref 26.0–34.0)
MCHC: 35.6 g/dL (ref 30.0–36.0)
MCV: 91.4 fL (ref 78.0–100.0)
Platelets: 76 10*3/uL — ABNORMAL LOW (ref 150–400)
RBC: 4.52 MIL/uL (ref 4.22–5.81)
RDW: 13.2 % (ref 11.5–15.5)
WBC: 6.4 10*3/uL (ref 4.0–10.5)

## 2014-02-24 LAB — AMMONIA: Ammonia: 97 umol/L — ABNORMAL HIGH (ref 11–60)

## 2014-02-24 LAB — PROTIME-INR
INR: 1.34 (ref 0.00–1.49)
PROTHROMBIN TIME: 16.6 s — AB (ref 11.6–15.2)

## 2014-02-24 MED ORDER — SIMETHICONE 80 MG PO CHEW: 160.0000 mg | CHEWABLE_TABLET | Freq: Four times a day (QID) | ORAL | Status: DC | PRN

## 2014-02-24 MED ORDER — FAMOTIDINE 20 MG PO TABS: 20.0000 mg | ORAL_TABLET | ORAL | Status: DC | PRN

## 2014-02-24 MED ORDER — LACTULOSE 10 GM/15ML PO SOLN: 30.0000 g | Freq: Four times a day (QID) | ORAL | Status: DC

## 2014-02-24 NOTE — Progress Notes (Signed)
Pt discharge instructions given, pt and wife verbalized understanding.  Pt left floor via wheelchair accompanied by staff and family.

## 2014-02-24 NOTE — Discharge Summary (Signed)
Physician Discharge Summary  Peter Larson BUL:[id number] DOB: 10-22-1950 DOA: 02/23/2014  PCP: [name], MD  Admit date: 02/23/2014 Discharge date: 02/24/2014  Time spent: >35 minutes  Recommendations for Outpatient Follow-up:  F/u at [facility] on 9/23 as scheduled F/u with PCP in 1 week as needed   Discharge Diagnoses:  Principal Problem:   Hepatic encephalopathy Active Problems:   OSA (obstructive sleep apnea)   CKD (chronic kidney disease), stage III   Discharge Condition: stable   Diet recommendation: low sodium   Filed Weights   02/23/14 1305 02/23/14 1628  Weight: 151.184 kg (333 lb 4.8 oz) 151.048 kg (333 lb)    History of present illness:  63 y.o. male with PMH of Pre DM, CHF, HTN, OSA on CPAP, CKD, NASH Cirrhosis f/u at [facility], morbid obesity presented with intermittent confusion for 2 days;  -Pt was not taking lactulose as prescribed   Hospital Course:  1. Mild hepatic encephalopathy; ammonia -125; afebrile, no leukocytosis, no s/s SBP; UA: unremarkable;  CXR no clear infiltrate  -symptoms resolved with increasing dose lactulose, cont rifaximin;   -mild elevated Bili; recommended to f/u at [facility] on 9/23 (Pt has appointment) to repeat CMP 2. CHF chronic diastolic HF; echo (08/2120): LVEF 60%, LVH, Pulmonary HTN, grade 2 diastolic dysfunction  -clinically mild hypervolemic (CHF+cirrhosis); but no acute exacerbation  -cont home diuretics;  3. NASH, cirrhosis, thrombocytopenia (no s/s of bleeding); outpatient f/u at [facility]  -f/u at [facility] on 9/23 4. OSA on CPAP   Procedures:  none (i.e. Studies not automatically included, echos, thoracentesis, etc; not x-rays)  Consultations:  none  Discharge Exam: Filed Vitals:   02/24/14 0955  BP: 94/63  Pulse: 81  Temp: 97.4 F (36.3 C)  Resp: 19    General: alert, oriented  Cardiovascular: s1,s2 rrr Respiratory: CTA BL  Discharge Instructions  Discharge Instructions   Diet - low sodium heart healthy    Complete  by:  As directed      Discharge instructions    Complete by:  As directed   Please follow up at [facility] as scheduled     Increase activity slowly    Complete by:  As directed             Medication List         cetirizine 10 MG tablet  Commonly known as:  ZYRTEC  Take 10 mg by mouth at bedtime.     cholecalciferol 1000 UNITS tablet  Commonly known as:  VITAMIN D  Take 2,000 Units by mouth daily.     esomeprazole 40 MG capsule  Commonly known as:  NEXIUM  Take 40 mg by mouth daily at 12 noon.     famotidine 20 MG tablet  Commonly known as:  PEPCID  Take 1 tablet (20 mg total) by mouth as needed for heartburn or indigestion.     furosemide 40 MG tablet  Commonly known as:  LASIX  Take 1 tablet (40 mg total) by mouth 2 (two) times daily.     lactulose 10 GM/15ML solution  Commonly known as:  CHRONULAC  Take 45 mLs (30 g total) by mouth 4 (four) times daily.     milk thistle 175 MG tablet  Take 350 mg by mouth daily.     multivitamin tablet  Take 1 tablet by mouth daily.     rifaximin 550 MG Tabs tablet  Commonly known as:  XIFAXAN  Take 1 tablet (550 mg total) by mouth 2 (two) times  daily.     simethicone 80 MG chewable tablet  Commonly known as:  MYLICON  Chew 2 tablets (160 mg total) by mouth 4 (four) times daily as needed for flatulence.     spironolactone 50 MG tablet  Commonly known as:  ALDACTONE  Take 1 tablet (50 mg total) by mouth 2 (two) times daily.     TART CHERRY ADVANCED PO  Take 1 capsule by mouth daily.       No Known Allergies     Follow-up Information   Follow up with [name], MD In 1 week.   Specialty:  Family Medicine   Contact information:   [address] [place] [address] [phone]        The results of significant diagnostics from this hospitalization (including imaging, microbiology, ancillary and laboratory) are listed below for reference.    Significant Diagnostic Studies: Dg Chest 1  View  02/23/2014   CLINICAL DATA:  Cough, history of CHF and chronic renal insufficiency, and cirrhosis  EXAM: CHEST - 1 VIEW  COMPARISON:  PA and lateral chest of February 12, 2014  FINDINGS: The lungs are adequately inflated. The interstitial markings are increased though stable. The cardiopericardial silhouette is top-normal in size. The pulmonary vascularity is not engorged. The observed bony thorax is unremarkable.  IMPRESSION: The findings are consistent with low-grade CHF with mild interstitial edema. There is no alveolar pneumonia.   Electronically Signed   By: David  Martinique   On: 02/23/2014 17:27   Dg Chest 2 View  02/12/2014   CLINICAL DATA:  Shortness of breath  EXAM: CHEST  2 VIEW  COMPARISON:  01/29/2014  FINDINGS: Cardiomegaly with pulmonary vascular congestion and possible mild interstitial edema. No pleural effusion or pneumothorax.  IMPRESSION: Cardiomegaly with pulmonary vascular congestion and possible mild interstitial edema.   Electronically Signed   By: Julian Hy M.D.   On: 02/12/2014 02:04   Dg Chest 2 View  01/29/2014   CLINICAL DATA:  Leg swelling.  Shortness of breath.  CHF.  EXAM: CHEST  2 VIEW  COMPARISON:  11/04/2013.  FINDINGS: Mild CHF is present. The cardiopericardial silhouette is within normal limits for projection. There is pulmonary vascular congestion. Interstitial pulmonary edema. Basilar atelectasis, most prominent at the costophrenic angles. Small bilateral pleural effusions.  IMPRESSION: Mild CHF.   Electronically Signed   By: Dereck Ligas M.D.   On: 01/29/2014 15:08   Ct Head Wo Contrast  02/12/2014   CLINICAL DATA:  Confusion.  EXAM: CT HEAD WITHOUT CONTRAST  TECHNIQUE: Contiguous axial images were obtained from the base of the skull through the vertex without intravenous contrast.  COMPARISON:  None.  FINDINGS: Ventricles and sulci appear symmetrical. No mass effect or midline shift. No abnormal extra-axial fluid collections. Gray-white matter  junctions are distinct. Basal cisterns are not effaced. No evidence of acute intracranial hemorrhage. No depressed skull fractures. Visualized paranasal sinuses and mastoid air cells are not opacified.  IMPRESSION: No acute intracranial abnormalities.   Electronically Signed   By: Lucienne Capers M.D.   On: 02/12/2014 03:48    Microbiology: No results found for this or any previous visit (from the past 240 hour(s)).   Labs: Basic Metabolic Panel:  Recent Labs Lab 02/23/14 1307 02/24/14 0453  NA 140 137  K 4.4 4.3  CL 102 99  CO2 22 22  GLUCOSE 94 128*  BUN 33* 33*  CREATININE 1.50* 1.54*  CALCIUM 9.9 9.9   Liver Function Tests:  Recent Labs Lab 02/23/14 1307 02/24/14 0453  AST 40* 39*  ALT 28 27  ALKPHOS 86 81  BILITOT 1.5* 2.3*  PROT 7.5 7.5  ALBUMIN 4.0 4.0   No results found for this basename: LIPASE, AMYLASE,  in the last 168 hours  Recent Labs Lab 02/23/14 1319 02/24/14 0453  AMMONIA 125* 97*   CBC:  Recent Labs Lab 02/23/14 1307 02/24/14 0453  WBC 4.6 6.4  NEUTROABS 2.7  --   HGB 14.2 14.7  HCT 39.9 41.3  MCV 92.4 91.4  PLT 67* 76*   Cardiac Enzymes: No results found for this basename: CKTOTAL, CKMB, CKMBINDEX, TROPONINI,  in the last 168 hours BNP: BNP (last 3 results)  Recent Labs  01/29/14 1433 02/12/14 0141  PROBNP 1227.0* 76.6   CBG:  Recent Labs Lab 02/23/14 1326  GLUCAP 93       Signed:  Loula Marcella N  [facility] 02/24/2014, 11:44 AM

## 2014-02-24 NOTE — Progress Notes (Signed)
Pt's wife in room wanted to know if pt is going to be discharged today.  Paged Dr. Sallyanne Kuster, pt has appt at [facility] tomorrow @ 1300.

## 2014-02-24 NOTE — Discharge Instructions (Signed)
Please follow up at [facility] as scheduled

## 2014-03-20 ENCOUNTER — Other Ambulatory Visit: Payer: Self-pay

## 2014-04-06 ENCOUNTER — Ambulatory Visit: Payer: 59 | Admitting: Cardiology

## 2014-11-30 ENCOUNTER — Other Ambulatory Visit: Payer: Self-pay

## 2015-02-24 ENCOUNTER — Encounter (HOSPITAL_COMMUNITY): Payer: Self-pay | Admitting: *Deleted

## 2015-03-04 ENCOUNTER — Other Ambulatory Visit: Payer: Self-pay | Admitting: Family Medicine

## 2015-03-04 ENCOUNTER — Ambulatory Visit
Admission: RE | Admit: 2015-03-04 | Discharge: 2015-03-04 | Disposition: A | Discharge status: Home / Self Care | Payer: 59 | Source: Ambulatory Visit | Attending: Family Medicine | Admitting: Family Medicine

## 2015-03-04 DIAGNOSIS — J209 Acute bronchitis, unspecified: Secondary | ICD-10-CM

## 2015-03-08 NOTE — Anesthesia Preprocedure Evaluation (Addendum)
Anesthesia Evaluation  Patient identified by MRN, date of birth, ID band Patient awake    Reviewed: Allergy & Precautions, NPO status , Patient's Chart, lab work & pertinent test results, reviewed documented beta blocker date and time   Airway Mallampati: II   Neck ROM: Full    Dental  (+) Dental Advisory Given, Teeth Intact   Pulmonary sleep apnea and Continuous Positive Airway Pressure Ventilation ,    breath sounds clear to auscultation       Cardiovascular hypertension, Pt. on medications  Rhythm:Regular  ECHO 2015 EF 60%, grade II diastolic failure   Neuro/Psych    GI/Hepatic (+) Cirrhosis       , Hepatic encephalopathy in past   Endo/Other  diabetes, Type 2  Renal/GU Renal InsufficiencyRenal diseaseStage III     Musculoskeletal   Abdominal (+) + obese,   Peds  Hematology   Anesthesia Other Findings   Reproductive/Obstetrics                            Anesthesia Physical Anesthesia Plan  ASA: III  Anesthesia Plan: MAC   Post-op Pain Management:    Induction: Intravenous  Airway Management Planned: Nasal Cannula  Additional Equipment:   Intra-op Plan:   Post-operative Plan:   Informed Consent: I have reviewed the patients History and Physical, chart, labs and discussed the procedure including the risks, benefits and alternatives for the proposed anesthesia with the patient or authorized representative who has indicated his/her understanding and acceptance.     Plan Discussed with:   Anesthesia Plan Comments:         Anesthesia Quick Evaluation

## 2015-03-11 ENCOUNTER — Ambulatory Visit (HOSPITAL_COMMUNITY)
Admission: RE | Admit: 2015-03-11 | Discharge: 2015-03-11 | Disposition: A | Discharge status: Home / Self Care | Payer: 59 | Source: Ambulatory Visit | Attending: Gastroenterology | Admitting: Gastroenterology

## 2015-03-11 ENCOUNTER — Other Ambulatory Visit: Payer: Self-pay | Admitting: Gastroenterology

## 2015-03-11 ENCOUNTER — Encounter (HOSPITAL_COMMUNITY): Payer: Self-pay

## 2015-03-11 ENCOUNTER — Ambulatory Visit (HOSPITAL_COMMUNITY): Payer: 59 | Admitting: Anesthesiology

## 2015-03-11 ENCOUNTER — Encounter (HOSPITAL_COMMUNITY)
Admission: RE | Disposition: A | Discharge status: Home / Self Care | Payer: Self-pay | Source: Ambulatory Visit | Attending: Gastroenterology

## 2015-03-11 DIAGNOSIS — M069 Rheumatoid arthritis, unspecified: Secondary | ICD-10-CM | POA: Diagnosis not present

## 2015-03-11 DIAGNOSIS — K621 Rectal polyp: Secondary | ICD-10-CM | POA: Insufficient documentation

## 2015-03-11 DIAGNOSIS — Z6841 Body Mass Index (BMI) 40.0 and over, adult: Secondary | ICD-10-CM | POA: Insufficient documentation

## 2015-03-11 DIAGNOSIS — K7581 Nonalcoholic steatohepatitis (NASH): Secondary | ICD-10-CM | POA: Insufficient documentation

## 2015-03-11 DIAGNOSIS — K635 Polyp of colon: Secondary | ICD-10-CM | POA: Diagnosis not present

## 2015-03-11 DIAGNOSIS — K7460 Unspecified cirrhosis of liver: Secondary | ICD-10-CM | POA: Diagnosis not present

## 2015-03-11 DIAGNOSIS — Z800 Family history of malignant neoplasm of digestive organs: Secondary | ICD-10-CM | POA: Insufficient documentation

## 2015-03-11 DIAGNOSIS — R161 Splenomegaly, not elsewhere classified: Secondary | ICD-10-CM | POA: Diagnosis not present

## 2015-03-11 DIAGNOSIS — Z79899 Other long term (current) drug therapy: Secondary | ICD-10-CM | POA: Diagnosis not present

## 2015-03-11 DIAGNOSIS — K739 Chronic hepatitis, unspecified: Secondary | ICD-10-CM | POA: Insufficient documentation

## 2015-03-11 DIAGNOSIS — G4733 Obstructive sleep apnea (adult) (pediatric): Secondary | ICD-10-CM | POA: Insufficient documentation

## 2015-03-11 DIAGNOSIS — I10 Essential (primary) hypertension: Secondary | ICD-10-CM | POA: Insufficient documentation

## 2015-03-11 DIAGNOSIS — E119 Type 2 diabetes mellitus without complications: Secondary | ICD-10-CM | POA: Diagnosis not present

## 2015-03-11 DIAGNOSIS — K5730 Diverticulosis of large intestine without perforation or abscess without bleeding: Secondary | ICD-10-CM | POA: Diagnosis not present

## 2015-03-11 DIAGNOSIS — Z1211 Encounter for screening for malignant neoplasm of colon: Secondary | ICD-10-CM | POA: Diagnosis not present

## 2015-03-11 DIAGNOSIS — I5032 Chronic diastolic (congestive) heart failure: Secondary | ICD-10-CM | POA: Diagnosis not present

## 2015-03-11 DIAGNOSIS — E669 Obesity, unspecified: Secondary | ICD-10-CM | POA: Insufficient documentation

## 2015-03-11 DIAGNOSIS — Z86010 Personal history of colonic polyps: Secondary | ICD-10-CM | POA: Insufficient documentation

## 2015-03-11 HISTORY — PX: ESOPHAGOGASTRODUODENOSCOPY (EGD) WITH PROPOFOL: SHX5813

## 2015-03-11 HISTORY — PX: COLONOSCOPY WITH PROPOFOL: SHX5780

## 2015-03-11 SURGERY — ESOPHAGOGASTRODUODENOSCOPY (EGD) WITH PROPOFOL
Anesthesia: Monitor Anesthesia Care

## 2015-03-11 MED ORDER — PROPOFOL 10 MG/ML IV BOLUS
INTRAVENOUS | Status: AC
  Filled 2015-03-11: qty 20

## 2015-03-11 MED ORDER — SPOT INK MARKER SYRINGE KIT
PACK | SUBMUCOSAL | Status: AC
  Filled 2015-03-11: qty 5

## 2015-03-11 MED ORDER — LACTATED RINGERS IV SOLN
INTRAVENOUS | Status: DC
  Administered 2015-03-11: 1000 mL via INTRAVENOUS

## 2015-03-11 MED ORDER — LIDOCAINE HCL (CARDIAC) 20 MG/ML IV SOLN
INTRAVENOUS | Status: DC | PRN
  Administered 2015-03-11: 100 mg via INTRAVENOUS

## 2015-03-11 MED ORDER — PROPOFOL 500 MG/50ML IV EMUL
INTRAVENOUS | Status: DC | PRN
  Administered 2015-03-11: 140 ug/kg/min via INTRAVENOUS

## 2015-03-11 MED ORDER — SODIUM CHLORIDE 0.9 % IV SOLN: INTRAVENOUS | Status: DC

## 2015-03-11 MED ORDER — PROMETHAZINE HCL 25 MG/ML IJ SOLN: 6.2500 mg | INTRAMUSCULAR | Status: DC | PRN

## 2015-03-11 MED ORDER — PROPOFOL 10 MG/ML IV BOLUS
INTRAVENOUS | Status: AC
Start: 2015-03-11 — End: 2015-03-11
  Filled 2015-03-11: qty 20

## 2015-03-11 MED ORDER — PROPOFOL 10 MG/ML IV BOLUS
INTRAVENOUS | Status: DC | PRN
  Administered 2015-03-11 (×6): 20 mg via INTRAVENOUS

## 2015-03-11 MED ORDER — SPOT INK MARKER SYRINGE KIT
PACK | SUBMUCOSAL | Status: DC | PRN
  Administered 2015-03-11: 2 mL via SUBMUCOSAL

## 2015-03-11 MED ORDER — FENTANYL CITRATE (PF) 100 MCG/2ML IJ SOLN
INTRAMUSCULAR | Status: AC
  Filled 2015-03-11: qty 4

## 2015-03-11 MED ORDER — MEPERIDINE HCL 100 MG/ML IJ SOLN: 6.2500 mg | INTRAMUSCULAR | Status: DC | PRN

## 2015-03-11 MED ORDER — FENTANYL CITRATE (PF) 100 MCG/2ML IJ SOLN
INTRAMUSCULAR | Status: DC | PRN
  Administered 2015-03-11 (×2): 50 ug via INTRAVENOUS

## 2015-03-11 MED ORDER — BUTAMBEN-TETRACAINE-BENZOCAINE 2-2-14 % EX AERO
INHALATION_SPRAY | CUTANEOUS | Status: DC | PRN
  Administered 2015-03-11: 2 via TOPICAL

## 2015-03-11 SURGICAL SUPPLY — 25 items

## 2015-03-11 NOTE — H&P (Signed)
Peter Larson is an 64 y.o. male.   Chief Complaint: NASH cirrhosis, h/o colon polyps HPI: This is a 64 year old patient that I followed for years because of nonalcoholic steatohepatitis with fibrosis and cirrhosis, and complications including encephalopathy, portal gastropathy, and fluid retention. His most recent endoscopy, 1-1/2 years ago, was negative for varices. Meanwhile, he has a history of colon polyps with his most recent colonoscopy, 3 years ago, having shown 4 small adenomatous polyps  Past Medical History  Diagnosis Date  . Sleep apnea   . Splenomegaly     slt thrombocytopenia;no complics. egd 5/72 neg for varices, ?mild portal gastropathy; 02/2011 nl,novarices  . Diabetes mellitus without complication (Peter Larson)     type two  . Hypertension   . Adenomatous colon polyp '01 & '08  . Hemorrhoids     Severe anorectal pain and anal fissure w bleeding following hemorrhoidectomy may 2010  . Family history of colon cancer     mother --61  . Seasonal allergies   . Screening     Hepatocellular Screening CT neg 05/2008, U/S neg 04/2009,05/2010  . Dysphagia     BaS/tab neg 8/09; egd's neg for ring/strict--?dysmotility  . Chronic diastolic congestive heart failure ([place])     a.  ECHO 01/13/14: EF 55-60%; moderate LVH, G2DD, biatrial enlargement; mild RVE; moderate MR; mildly elevated, pulm pressures.  . OSA (obstructive sleep apnea)   . Morbid obesity (Peter Larson)   . Frequent PVCs   . Pancytopenia (Peter Larson)   . Cirrhosis (Peter Larson)     a. Due to NASH, followed by Peter Larson.  Peter Larson Shortness of breath   . Arthritis     RA  " ALL OVER "  . Dilatation of aorta (Peter Larson)     a. Mild aortic root dilitation by echo 01/13/14  . Chronic hepatitis (Peter Larson)     cirrhosis    Past Surgical History  Procedure Laterality Date  . Fissure surgery    . Hemorrhoid surgery      Family History  Problem Relation Age of Onset  . Cancer Mother     died of breast cancer  . Liver disease Father   . Thyroid disease  Sister    Social History:  reports that he has never smoked. He has never used smokeless tobacco. He reports that he does not drink alcohol or use illicit drugs.  Allergies: No Known Allergies  Medications Prior to Admission  Medication Sig Dispense Refill  . acetaminophen (TYLENOL ARTHRITIS PAIN) 650 MG CR tablet Take 1,300 mg by mouth every morning.    . cholecalciferol (VITAMIN D) 1000 UNITS tablet Take 3,000 Units by mouth every morning.     Peter Larson esomeprazole (NEXIUM) 40 MG capsule Take 40 mg by mouth daily at 12 noon.    . famotidine (PEPCID) 20 MG tablet Take 1 tablet (20 mg total) by mouth as needed for heartburn or indigestion.    . furosemide (LASIX) 40 MG tablet Take 1 tablet (40 mg total) by mouth 2 (two) times daily. 60 tablet 6  . lactulose (CHRONULAC) 10 GM/15ML solution Take 45 mLs (30 g total) by mouth 4 (four) times daily. 240 mL 0  . milk thistle 175 MG tablet Take 350 mg by mouth every morning.     . Multiple Vitamin (MULTIVITAMIN) tablet Take 1 tablet by mouth every morning.     . rifaximin (XIFAXAN) 550 MG TABS tablet Take 1 tablet (550 mg total) by mouth 2 (two) times daily. 60 tablet 0  .  simethicone (MYLICON) 80 MG chewable tablet Chew 2 tablets (160 mg total) by mouth 4 (four) times daily as needed for flatulence. (Patient taking differently: Chew 80 mg by mouth 2 (two) times daily. ) 30 tablet 0  . spironolactone (ALDACTONE) 50 MG tablet Take 1 tablet (50 mg total) by mouth 2 (two) times daily. 60 tablet 6    No results found for this or any previous visit (from the past 48 hour(s)). No results found.  ROS the patient has achieved a significant weight loss by dietary restriction. He is getting over a recent upper respiratory infection.   Blood pressure 149/70, pulse 88, temperature 98.3 F (36.8 C), temperature source Oral, resp. rate 15, height 6\' 1"  (1.854 m), weight 154.677 kg (341 lb), SpO2 96 %. Physical Exam Severely overweight, but otherwise quite healthy in  appearance. No distress. No pallor, no icterus. Chest clear anteriorly, no rales or wheezes heard. Heart is normal with regular rhythm and no murmur or gallop appreciated. Abdomen is obese, but nondistended, no obvious ascites, no mass or tenderness  Assessment/Plan 1. Cirrhosis 2. History of colon polyps  We will proceed to endoscopy and colonoscopy under propofol sedation  Shabreka Coulon V 03/11/2015, 10:31 AM

## 2015-03-11 NOTE — Op Note (Signed)
[facility] [place], [id number]   ENDOSCOPY PROCEDURE REPORT  PATIENT: Peter Larson, Peter Larson  MR#: [id number] BIRTHDATE: 1951-04-01 , 63  yrs. old GENDER: male ENDOSCOPIST:Roderica Cathell [place], MD REFERRED BY: Antony Contras, MD PROCEDURE DATE:  26-Mar-2015 PROCEDURE:   upper endoscopy ASA CLASS:    III INDICATIONS: NASH cirrhosis, rule out varices MEDICATION: MAC--propofol and fentanyl, per anesthesia TOPICAL ANESTHETIC:   see nursing note  DESCRIPTION OF PROCEDURE:   After the risks and benefits of the procedure were explained, informed consent was obtained.  The [id number] ([id number])  endoscope was introduced through the mouth  and advanced to the second portion of the duodenum .  The instrument was slowly withdrawn as the mucosa was fully examined. Estimated blood loss is zero unless otherwise noted in this procedure report. the patient came as an outpatient to the [facility] long endoscopy unit.    Larynx normal.  the esophagus was easily entered and was normal in its entirety, specifically without evidence of varices. No ring, stricture, Barrett's esophagus, or reflux esophagitis were noted.  The stomach was entered. It contained a small to moderate ileus residual which was suctioned out. The mucosa of the entire stomach, especially the proximal stomach in the antrum, had "snakeskin" changes suggestive of portal gastropathy, along with contact hemorrhage suggesting an element of mucosal fragility. However, there was not diffuse erythema to suggest bile reflux gastritis. A retroflex view the cardia showed no evidence of gastric varices or other abnormalities. No polyps, masses, or ulcers were seen.  The pylorus, duodenal bulb, and second duodenum looked normal. As noted, no biopsies were obtained.    The scope was then withdrawn from the patient and the procedure completed.  COMPLICATIONS: There were no immediate complications.  ENDOSCOPIC  IMPRESSION: 1. No evidence of esophageal varices 2. Changes suggestive of portal gastropathy   RECOMMENDATIONS: Repeat endoscopy in about 2 years   _______________________________ eSigned:  Ronald Lobo, MD 03/26/15 11:49 AM     cc:  CPT CODES: ICD CODES:  The ICD and CPT codes recommended by this software are interpretations from the data that the clinical staff has captured with the software.  The verification of the translation of this report to the ICD and CPT codes and modifiers is the sole responsibility of the health care institution and practicing physician where this report was generated.  [employer] will not be held responsible for the validity of the ICD and CPT codes included on this report.  AMA assumes no liability for data contained or not contained herein. CPT is a Designer, television/film set of the [employer].  PATIENT NAME:  [name], [name] MR#: [id number]

## 2015-03-11 NOTE — Transfer of Care (Signed)
Immediate Anesthesia Transfer of Care Note  Patient: Peter Larson  Procedure(s) Performed: Procedure(s): ESOPHAGOGASTRODUODENOSCOPY (EGD) WITH PROPOFOL (N/A) COLONOSCOPY WITH PROPOFOL (N/A)  Patient Location: Endoscopy Unit  Anesthesia Type:MAC  Level of Consciousness: awake and alert   Airway & Oxygen Therapy: Patient Spontanous Breathing and Patient connected to face mask oxygen  Post-op Assessment: Report given to RN and Post -op Vital signs reviewed and stable  Post vital signs: Reviewed and stable  Last Vitals:  Filed Vitals:   03/11/15 0846  BP: 149/70  Pulse: 88  Temp: 36.8 C  Resp: 15    Complications: No apparent anesthesia complications

## 2015-03-11 NOTE — Op Note (Signed)
[facility] [place], [id number]   COLONOSCOPY PROCEDURE REPORT  PATIENT: Peter Larson, Peter Larson  MR#: [id number] BIRTHDATE: 1950/07/16 , 33  yrs. old GENDER: male ENDOSCOPIST: Ronald Lobo, MD REFERRED BY:  Antony Contras, MD PROCEDURE DATE:  21-Mar-2015 PROCEDURE:   colonoscopy with biopsy and directed submucosal injection ASA CLASS:   III INDICATIONS:  prior history of multiple colonic adenomas on most recent colonoscopy 3 years ago MEDICATIONS: MAC-- fentanyl and propofol per anesthesia  DESCRIPTION OF PROCEDURE:   After the risks and benefits and of the procedure were explained, informed consent was obtained. The patient came as an outpatient to the [facility] long endoscopy unit, and this procedure was performed immediately following his upper endoscopy. Digital rectal exam was normal, specifically, the prostate felt normal.         The Pentax Adult Colonscope [id number] endoscope was introduced through the anus and advanced to the proximal ascending colon, just above the cecum      .  The quality of the prep was good but not ideal due to some adherent stool film here and there. However, it is not felt that any significant lesions would have been missed      .  The instrument was then slowly withdrawn as the colon was fully examined. Estimated blood loss is zero unless otherwise noted in this procedure report.   Despite prolonged efforts, including external abdominal compression by 2 assistants, and having the patient in the supine position, I was not able to enter the base of the cecum. Instead, the maximal extent of the exam was a short distance above the cecum, but I was able to look down into the cecum and note that it was grossly normal.  a short distance above the cecum was a roughly 2-3 cm polypoid mass, benign in appearance, with a verrucous base and an erythematous nodular tip. Multiple biopsies were obtained of this lesion and then an injector  needle was used to inject some SPOT to tattoo the area for later reference. It was notpossible to remove the polyp definitively, because I could not get close enough to 2 reliably snare. Moreover, I was not sure that it is benign, and on top of that, this patient has a low platelet count and I felt was at risk for significant bleeding.  There was a small polyp in the rectum removed by a single cold biopsy was a fair amount of oozing that stopped spontaneously.  There was some mild sigmoid diverticulosis..          The scope was then withdrawn from the patient and the procedure completed.  estimated blood loss for this procedure was 1 mL.  WITHDRAWAL TIME: Per nursing note  COMPLICATIONS: There were no immediate complications.  ENDOSCOPIC IMPRESSION: 1. Large proximal colonic polyp, biopsied but not removed. 2. Small rectal polyp. 3. Sigmoid diverticulosis 4. Prior history of multiple colonic adenomas. 5. Unable to reach the cecum   RECOMMENDATIONS: await pathology. If dysplastic, consider surgical excision given this patient's low platelet count, versus referral to [facility] for possible EMR, versus colonoscopic periodic surveillance. Use of a different type of scope on future exams (for example, Olympus equipment) might allow reaching the cecum as occurred at the time of his last procedure when Olympus equipment was used.  REPEAT EXAM: to be determined, depending on path results  cc:  _______________________________ eSignedRonald Lobo, MD March 21, 2015 12:02 PM   CPT CODES: ICD CODES:  The ICD and CPT  codes recommended by this software are interpretations from the data that the clinical staff has captured with the software.  The verification of the translation of this report to the ICD and CPT codes and modifiers is the sole responsibility of the health care institution and practicing physician where this report was generated.  [employer] will not be held responsible for the validity of the ICD and CPT codes included on this report.  AMA assumes no liability for data contained or not contained herein. CPT is a Designer, television/film set of the [employer].   PATIENT NAME:  Peter Larson, Peter Larson MR#: [id number]

## 2015-03-11 NOTE — Anesthesia Postprocedure Evaluation (Signed)
  Anesthesia Post-op Note  Patient: CELESTER LECH  Procedure(s) Performed: Procedure(s): ESOPHAGOGASTRODUODENOSCOPY (EGD) WITH PROPOFOL (N/A) COLONOSCOPY WITH PROPOFOL (N/A)  Patient Location: PACU  Anesthesia Type:MAC  Level of Consciousness: awake  Airway and Oxygen Therapy: Patient Spontanous Breathing  Post-op Pain: none  Post-op Assessment: Post-op Vital signs reviewed, Patient's Cardiovascular Status Stable, Respiratory Function Stable, Patent Airway and No signs of Nausea or vomiting              Post-op Vital Signs: Reviewed and stable  Last Vitals:  Filed Vitals:   03/11/15 1200  BP: 157/63  Pulse:   Temp:   Resp:     Complications: No apparent anesthesia complications

## 2015-03-11 NOTE — Discharge Instructions (Signed)
Colonoscopy, Care After °Refer to this sheet in the next few weeks. These instructions provide you with information on caring for yourself after your procedure. Your health care provider may also give you more specific instructions. Your treatment has been planned according to current medical practices, but problems sometimes occur. Call your health care provider if you have any problems or questions after your procedure. °WHAT TO EXPECT AFTER THE PROCEDURE  °After your procedure, it is typical to have the following: °· A small amount of blood in your stool. °· Moderate amounts of gas and mild abdominal cramping or bloating. °HOME CARE INSTRUCTIONS °· Do not drive, operate machinery, or sign important documents for 24 hours. °· You may shower and resume your regular physical activities, but move at a slower pace for the first 24 hours. °· Take frequent rest periods for the first 24 hours. °· Walk around or put a warm pack on your abdomen to help reduce abdominal cramping and bloating. °· Drink enough fluids to keep your urine clear or pale yellow. °· You may resume your normal diet as instructed by your health care provider. Avoid heavy or fried foods that are hard to digest. °· Avoid drinking alcohol for 24 hours or as instructed by your health care provider. °· Only take over-the-counter or prescription medicines as directed by your health care provider. °· If a tissue sample (biopsy) was taken during your procedure: °¨ Do not take aspirin or blood thinners for 7 days, or as instructed by your health care provider. °¨ Do not drink alcohol for 7 days, or as instructed by your health care provider. °¨ Eat soft foods for the first 24 hours. °SEEK MEDICAL CARE IF: °You have persistent spotting of blood in your stool 2-3 days after the procedure. °SEEK IMMEDIATE MEDICAL CARE IF: °· You have more than a small spotting of blood in your stool. °· You pass large blood clots in your stool. °· Your abdomen is swollen  (distended). °· You have nausea or vomiting. °· You have a fever. °· You have increasing abdominal pain that is not relieved with medicine. °  °This information is not intended to replace advice given to you by your health care provider. Make sure you discuss any questions you have with your health care provider. °  °Document Released: 01/04/2004 Document Revised: 03/12/2013 Document Reviewed: 01/27/2013 °Elsevier Interactive Patient Education ©2016 [employer] Inc[employer] ° ° °Esophagogastroduodenoscopy, Care After °Refer to this sheet in the next few weeks. These instructions provide you with information about caring for yourself after your procedure. Your health care provider may also give you more specific instructions. Your treatment has been planned according to current medical practices, but problems sometimes occur. Call your health care provider if you have any problems or questions after your procedure. °WHAT TO EXPECT AFTER THE PROCEDURE °After your procedure, it is typical to feel: °· Soreness in your throat. °· Pain with swallowing. °· Sick to your stomach (nauseous). °· Bloated. °· Dizzy. °· Fatigued. °HOME CARE INSTRUCTIONS °· Do not eat or drink anything until the numbing medicine (local anesthetic) has worn off and your gag reflex has returned. You will know that the local anesthetic has worn off when you can swallow comfortably. °· Do not drive or operate machinery until directed by your health care provider. °· Take medicines only as directed by your health care provider. °SEEK MEDICAL CARE IF:  °· You cannot stop coughing. °· You are not urinating at all or less than usual. °SEEK   IMMEDIATE MEDICAL CARE IF: °· You have difficulty swallowing. °· You cannot eat or drink. °· You have worsening throat or chest pain. °· You have dizziness or lightheadedness or you faint. °· You have nausea or vomiting. °· You have chills. °· You have a fever. °· You have severe abdominal pain. °· You have black, tarry, or bloody  stools. °  °This information is not intended to replace advice given to you by your health care provider. Make sure you discuss any questions you have with your health care provider. °  °Document Released: 05/08/2012 Document Revised: 06/12/2014 Document Reviewed: 05/08/2012 °Elsevier Interactive Patient Education ©2016 Elsevier Inc. ° °

## 2015-03-12 ENCOUNTER — Encounter (HOSPITAL_COMMUNITY): Payer: Self-pay | Admitting: Gastroenterology

## 2015-06-10 ENCOUNTER — Emergency Department (HOSPITAL_COMMUNITY): Payer: 59

## 2015-06-10 ENCOUNTER — Emergency Department (HOSPITAL_COMMUNITY)
Admission: EM | Admit: 2015-06-10 | Discharge: 2015-06-10 | Disposition: A | Discharge status: Home / Self Care | Payer: 59 | Attending: Emergency Medicine | Admitting: Emergency Medicine

## 2015-06-10 ENCOUNTER — Encounter (HOSPITAL_COMMUNITY): Payer: Self-pay | Admitting: *Deleted

## 2015-06-10 DIAGNOSIS — Z8669 Personal history of other diseases of the nervous system and sense organs: Secondary | ICD-10-CM | POA: Insufficient documentation

## 2015-06-10 DIAGNOSIS — Z79899 Other long term (current) drug therapy: Secondary | ICD-10-CM | POA: Diagnosis not present

## 2015-06-10 DIAGNOSIS — R600 Localized edema: Secondary | ICD-10-CM | POA: Insufficient documentation

## 2015-06-10 DIAGNOSIS — J90 Pleural effusion, not elsewhere classified: Secondary | ICD-10-CM | POA: Diagnosis not present

## 2015-06-10 DIAGNOSIS — Z8719 Personal history of other diseases of the digestive system: Secondary | ICD-10-CM | POA: Insufficient documentation

## 2015-06-10 DIAGNOSIS — R42 Dizziness and giddiness: Secondary | ICD-10-CM | POA: Insufficient documentation

## 2015-06-10 DIAGNOSIS — Z792 Long term (current) use of antibiotics: Secondary | ICD-10-CM | POA: Diagnosis not present

## 2015-06-10 DIAGNOSIS — M1990 Unspecified osteoarthritis, unspecified site: Secondary | ICD-10-CM | POA: Insufficient documentation

## 2015-06-10 DIAGNOSIS — I5032 Chronic diastolic (congestive) heart failure: Secondary | ICD-10-CM | POA: Diagnosis not present

## 2015-06-10 DIAGNOSIS — Z862 Personal history of diseases of the blood and blood-forming organs and certain disorders involving the immune mechanism: Secondary | ICD-10-CM | POA: Insufficient documentation

## 2015-06-10 DIAGNOSIS — R140 Abdominal distension (gaseous): Secondary | ICD-10-CM | POA: Diagnosis not present

## 2015-06-10 DIAGNOSIS — I10 Essential (primary) hypertension: Secondary | ICD-10-CM | POA: Diagnosis not present

## 2015-06-10 DIAGNOSIS — R0602 Shortness of breath: Secondary | ICD-10-CM | POA: Diagnosis present

## 2015-06-10 DIAGNOSIS — E119 Type 2 diabetes mellitus without complications: Secondary | ICD-10-CM | POA: Insufficient documentation

## 2015-06-10 DIAGNOSIS — Z86010 Personal history of colonic polyps: Secondary | ICD-10-CM | POA: Insufficient documentation

## 2015-06-10 HISTORY — DX: Hepatic encephalopathy: K76.82

## 2015-06-10 HISTORY — DX: Hepatic failure, unspecified without coma: K72.90

## 2015-06-10 LAB — COMPREHENSIVE METABOLIC PANEL
ALBUMIN: 2.7 g/dL — AB (ref 3.5–5.0)
ALT: 22 U/L (ref 17–63)
ANION GAP: 7 (ref 5–15)
AST: 42 U/L — ABNORMAL HIGH (ref 15–41)
Alkaline Phosphatase: 129 U/L — ABNORMAL HIGH (ref 38–126)
BUN: 21 mg/dL — ABNORMAL HIGH (ref 6–20)
CALCIUM: 8.7 mg/dL — AB (ref 8.9–10.3)
CHLORIDE: 105 mmol/L (ref 101–111)
CO2: 25 mmol/L (ref 22–32)
Creatinine, Ser: 1.34 mg/dL — ABNORMAL HIGH (ref 0.61–1.24)
GFR calc non Af Amer: 54 mL/min — ABNORMAL LOW (ref >60)
GLUCOSE: 112 mg/dL — AB (ref 65–99)
POTASSIUM: 5.2 mmol/L — AB (ref 3.5–5.1)
SODIUM: 137 mmol/L (ref 135–145)
Total Bilirubin: 1.4 mg/dL — ABNORMAL HIGH (ref 0.3–1.2)
Total Protein: 6 g/dL — ABNORMAL LOW (ref 6.5–8.1)

## 2015-06-10 LAB — CBC
HEMATOCRIT: 34 % — AB (ref 39.0–52.0)
HEMOGLOBIN: 11.5 g/dL — AB (ref 13.0–17.0)
MCH: 32.3 pg (ref 26.0–34.0)
MCHC: 33.8 g/dL (ref 30.0–36.0)
MCV: 95.5 fL (ref 78.0–100.0)
Platelets: 65 10*3/uL — ABNORMAL LOW (ref 150–400)
RBC: 3.56 MIL/uL — AB (ref 4.22–5.81)
RDW: 14.2 % (ref 11.5–15.5)
WBC: 4.3 10*3/uL (ref 4.0–10.5)

## 2015-06-10 LAB — TROPONIN I

## 2015-06-10 LAB — BRAIN NATRIURETIC PEPTIDE: B NATRIURETIC PEPTIDE 5: 79.9 pg/mL (ref 0.0–100.0)

## 2015-06-10 LAB — URINALYSIS, ROUTINE W REFLEX MICROSCOPIC
Bilirubin Urine: NEGATIVE
Glucose, UA: NEGATIVE mg/dL
HGB URINE DIPSTICK: NEGATIVE
Ketones, ur: NEGATIVE mg/dL
Leukocytes, UA: NEGATIVE
Nitrite: NEGATIVE
PH: 5.5 (ref 5.0–8.0)
Protein, ur: NEGATIVE mg/dL
SPECIFIC GRAVITY, URINE: 1.01 (ref 1.005–1.030)

## 2015-06-10 LAB — LIPASE, BLOOD: Lipase: 36 U/L (ref 11–51)

## 2015-06-10 MED ORDER — FUROSEMIDE 10 MG/ML IJ SOLN
40.0000 mg | Freq: Once | INTRAMUSCULAR | Status: AC
Start: 2015-06-10 — End: 2015-06-10
  Administered 2015-06-10: 40 mg via INTRAVENOUS
  Filled 2015-06-10: qty 4

## 2015-06-10 MED ORDER — LACTULOSE 10 GM/15ML PO SOLN
30.0000 g | Freq: Once | ORAL | Status: DC
  Filled 2015-06-10: qty 45

## 2015-06-10 NOTE — ED Notes (Signed)
Actual weight completed in triage.

## 2015-06-10 NOTE — ED Notes (Signed)
Pt in radiology will bring pt to treatment room.

## 2015-06-10 NOTE — ED Provider Notes (Signed)
CSN: [id number]     Arrival date & time 06/10/15  0831 History   First MD Initiated Contact with Patient 06/10/15 0957     Chief Complaint  Patient presents with  . Shortness of Breath  . Leg Swelling     (Consider location/radiation/quality/duration/timing/severity/associated sxs/prior Treatment) HPI  65 year old male presents with shortness of breath for the past 2 weeks. Progressively worsening. Short of breath was much worse this morning when he was walking and he became dizzy while walking. This concerned him so he came to the ER. He has a history of liver failure and CHF. He is chronically on 40 mg of Lasix 1 day, then 60 mg Lasix the next. Saw his PCP last week and was told to go to 80 mg total. He did this for 3 days. He noticed he dropped 3 pounds but did not feel much better. Legs seem like they are back to being swollen again. He has had some lower abdominal swelling to the point that is hard to buckle his belt. There is no chest pain or chest pressure. No abdominal pain. Has had a dry cough during this time but no fevers. Feels like when he had too much fluid back in 2015. He states he feels like his normal weight at this time is around 360 lb.  Past Medical History  Diagnosis Date  . Sleep apnea   . Splenomegaly     slt thrombocytopenia;no complics. egd [id number] neg for varices, ?mild portal gastropathy; 02/2011 nl,novarices  . 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 Hypertension   . Adenomatous colon polyp '01 & '08  . Hemorrhoids     Severe anorectal pain and anal fissure w bleeding following hemorrhoidectomy may 2010  . Family history of colon cancer     mother --80  . Seasonal allergies   . Screening     Hepatocellular Screening CT neg 05/2008, U/S neg 04/2009,05/2010  . Dysphagia     BaS/tab neg 8/09; egd's neg for ring/strict--?dysmotility  . Chronic diastolic congestive heart failure ([place])     a.  ECHO 01/13/14: EF 55-60%; moderate LVH, G2DD,  biatrial enlargement; mild RVE; moderate MR; mildly elevated, pulm pressures.  . OSA (obstructive sleep apnea)   . Morbid obesity ([place])   . Frequent PVCs   . Pancytopenia ([place])   . 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  Marland Kitchen Shortness of breath   . Arthritis     RA  " ALL OVER "  . Dilatation of aorta ([facility])     a. Mild aortic root dilitation by echo 01/13/14  . Chronic hepatitis ([place])     cirrhosis  . Hepatic encephalopathy [facility])    Past Surgical History  Procedure Laterality Date  . Fissure surgery    . Hemorrhoid surgery    . Esophagogastroduodenoscopy (egd) with propofol N/A 03/11/2015    Procedure: ESOPHAGOGASTRODUODENOSCOPY (EGD) WITH PROPOFOL;  Surgeon: Ronald Lobo, MD;  Location: [facility] ENDOSCOPY;  Service: Endoscopy;  Laterality: N/A;  . Colonoscopy with propofol N/A 03/11/2015    Procedure: COLONOSCOPY WITH PROPOFOL;  Surgeon: Ronald Lobo, MD;  Location: [facility] ENDOSCOPY;  Service: Endoscopy;  Laterality: N/A;   Family History  Problem Relation Age of Onset  . Cancer Mother     died of breast cancer  . Liver disease Father   . Thyroid disease Sister    Social History  Substance Use Topics  . Smoking  status: Never Smoker   . Smokeless tobacco: Never Used  . Alcohol Use: No    Review of Systems  Constitutional: Negative for fever.  Respiratory: Positive for cough and shortness of breath.   Cardiovascular: Positive for leg swelling. Negative for chest pain.  Gastrointestinal: Positive for nausea (earlier today, now resolved) and abdominal distention. Negative for vomiting and abdominal pain.  Neurological: Positive for dizziness.  All other systems reviewed and are negative.     Allergies  Review of patient's allergies indicates no known allergies.  Home Medications   Prior to Admission medications   Medication Sig Start Date End Date Taking? Authorizing Provider  acetaminophen (TYLENOL ARTHRITIS PAIN) 650 MG CR tablet Take 1,300 mg  by mouth every morning.    Historical Provider, MD  cholecalciferol (VITAMIN D) 1000 UNITS tablet Take 3,000 Units by mouth every morning.     Historical Provider, MD  esomeprazole (NEXIUM) 40 MG capsule Take 40 mg by mouth daily at 12 noon.    Historical Provider, MD  famotidine (PEPCID) 20 MG tablet Take 1 tablet (20 mg total) by mouth as needed for heartburn or indigestion. 02/24/14   Kinnie Feil, MD  furosemide (LASIX) 40 MG tablet Take 1 tablet (40 mg total) by mouth 2 (two) times daily. 02/01/14   Dayna N Dunn, PA-C  lactulose (CHRONULAC) 10 GM/15ML solution Take 45 mLs (30 g total) by mouth 4 (four) times daily. 02/24/14   Kinnie Feil, MD  milk thistle 175 MG tablet Take 350 mg by mouth every morning.     Historical Provider, MD  Multiple Vitamin (MULTIVITAMIN) tablet Take 1 tablet by mouth every morning.     Historical Provider, MD  rifaximin (XIFAXAN) 550 MG TABS tablet Take 1 tablet (550 mg total) by mouth 2 (two) times daily. 02/13/14   Geradine Girt, DO  simethicone (MYLICON) 80 MG chewable tablet Chew 2 tablets (160 mg total) by mouth 4 (four) times daily as needed for flatulence. Patient taking differently: Chew 80 mg by mouth 2 (two) times daily.  02/24/14   Kinnie Feil, MD  spironolactone (ALDACTONE) 50 MG tablet Take 1 tablet (50 mg total) by mouth 2 (two) times daily. 02/01/14   Dayna N Dunn, PA-C   BP 131/61 mmHg  Pulse 94  Temp(Src) 98.2 F (36.8 C) (Oral)  Resp 21  Ht 6\' 1"  (1.854 m)  Wt 370 lb (167.831 kg)  BMI 48.83 kg/m2  SpO2 96% Physical Exam  Constitutional: He is oriented to person, place, and time. He appears well-developed and well-nourished.  Morbidly obese  HENT:  Head: Normocephalic and atraumatic.  Right Ear: External ear normal.  Left Ear: External ear normal.  Nose: Nose normal.  Eyes: Right eye exhibits no discharge. Left eye exhibits no discharge.  Neck: Neck supple.  Cardiovascular: Normal rate, regular rhythm, normal heart sounds and  intact distal pulses.   Pulmonary/Chest: Effort normal. No accessory muscle usage. No tachypnea. No respiratory distress. He has decreased breath sounds in the right lower field.  Abdominal: Soft. There is no tenderness.  Musculoskeletal: He exhibits edema (bilateral lower extremity edema with mild redness c/w poor venous return).  Neurological: He is alert and oriented to person, place, and time.  Skin: Skin is warm and dry.  Nursing note and vitals reviewed.   ED Course  Procedures (including critical care time) Labs Review Labs Reviewed  COMPREHENSIVE METABOLIC PANEL - Abnormal; Notable for the following:    Potassium 5.2 (*)  Glucose, Bld 112 (*)    BUN 21 (*)    Creatinine, Ser 1.34 (*)    Calcium 8.7 (*)    Total Protein 6.0 (*)    Albumin 2.7 (*)    AST 42 (*)    Alkaline Phosphatase 129 (*)    Total Bilirubin 1.4 (*)    GFR calc non Af Amer 54 (*)    All other components within normal limits  CBC - Abnormal; Notable for the following:    RBC 3.56 (*)    Hemoglobin 11.5 (*)    HCT 34.0 (*)    Platelets 65 (*)    All other components within normal limits  LIPASE, BLOOD  URINALYSIS, ROUTINE W REFLEX MICROSCOPIC (NOT AT [facility])  BRAIN NATRIURETIC PEPTIDE  TROPONIN I    Imaging Review Dg Chest 2 View  06/10/2015  CLINICAL DATA:  Shortness of breath and weakness with lower extremity swelling EXAM: CHEST - 2 VIEW COMPARISON:  03/04/2015 FINDINGS: Cardiac shadow is at the upper limits of normal in size. Mild central vascular congestion is again seen and stable. Superimposed bronchitic changes are noted as well. Small right-sided pleural effusion is now seen. No focal confluent infiltrate is noted. IMPRESSION: Chronic changes of mild CHF. A new small right-sided pleural effusion is now seen. Electronically Signed   By: Inez Catalina M.D.   On: 06/10/2015 09:21   I have personally reviewed and evaluated these images and lab results as part of my medical decision-making.   EKG  Interpretation   Date/Time:  Thursday June 10 2015 09:02:36 EST Ventricular Rate:  85 PR Interval:  210 QRS Duration: 84 QT Interval:  392 QTC Calculation: 466 R Axis:   6 Text Interpretation:  Sinus rhythm with 1st degree A-V block with  occasional Premature ventricular complexes and Possible Premature atrial  complexes with Abberant conduction Low voltage QRS Septal infarct , age  undetermined Abnormal ECG no significant change since 2015 Confirmed by  Siara Gorder  MD, Carletta Feasel ([id number]) on 06/10/2015 9:54:22 AM      MDM   Final diagnoses:  Pleural effusion, right  Bilateral leg edema    Patient with mild dyspnea, mostly on exertion. Appears to have small right-sided pleural effusion, likely this is from fluid overload given his leg edema. He did talk about dizziness and has intermittent PVCs but no ventricular tachycardia seen. Has a history of CHF but BNP is negative. He states he gets dizzy often when getting up to walk. Discussed that with CHF and dizziness there is some concern for arrhythmia, he does not want to stay on telemetry and had said wants to go home with increased Lasix. Given he is dizzy often I feel this is reasonable, did discussed strict return precautions. No signs of pneumonia including no fever, elevated white blood cell count, and his presentation appears much more like fluid overload.     Sherwood Gambler, MD 06/10/15 [id number]

## 2015-06-10 NOTE — ED Notes (Signed)
Pt states he has liver failure and chf.  Pt c/o increased LE edema, sob and weakness.  Pt states 5-10 lb weight gain.

## 2015-06-10 NOTE — Discharge Instructions (Signed)
Increase your Lasix to 40 mg twice per day until further instructed by your doctor. If you develop worsening shortness of breath, dizziness, or chest pain, come back to the ER immediately.

## 2015-06-30 ENCOUNTER — Inpatient Hospital Stay (HOSPITAL_COMMUNITY)
Admission: EM | Admit: 2015-06-30 | Discharge: 2015-07-03 | DRG: 291 | Disposition: A | Discharge status: Home / Self Care | Payer: 59 | Attending: Internal Medicine | Admitting: Internal Medicine

## 2015-06-30 ENCOUNTER — Encounter (HOSPITAL_COMMUNITY): Payer: Self-pay | Admitting: *Deleted

## 2015-06-30 ENCOUNTER — Emergency Department (HOSPITAL_COMMUNITY): Payer: 59

## 2015-06-30 DIAGNOSIS — R0602 Shortness of breath: Secondary | ICD-10-CM | POA: Diagnosis present

## 2015-06-30 DIAGNOSIS — N183 Chronic kidney disease, stage 3 unspecified: Secondary | ICD-10-CM | POA: Diagnosis present

## 2015-06-30 DIAGNOSIS — G4733 Obstructive sleep apnea (adult) (pediatric): Secondary | ICD-10-CM | POA: Diagnosis present

## 2015-06-30 DIAGNOSIS — N289 Disorder of kidney and ureter, unspecified: Secondary | ICD-10-CM

## 2015-06-30 DIAGNOSIS — I5033 Acute on chronic diastolic (congestive) heart failure: Secondary | ICD-10-CM | POA: Diagnosis present

## 2015-06-30 DIAGNOSIS — E119 Type 2 diabetes mellitus without complications: Secondary | ICD-10-CM

## 2015-06-30 DIAGNOSIS — I509 Heart failure, unspecified: Secondary | ICD-10-CM

## 2015-06-30 DIAGNOSIS — D649 Anemia, unspecified: Secondary | ICD-10-CM | POA: Diagnosis present

## 2015-06-30 DIAGNOSIS — K739 Chronic hepatitis, unspecified: Secondary | ICD-10-CM | POA: Diagnosis present

## 2015-06-30 DIAGNOSIS — I130 Hypertensive heart and chronic kidney disease with heart failure and stage 1 through stage 4 chronic kidney disease, or unspecified chronic kidney disease: Secondary | ICD-10-CM | POA: Diagnosis not present

## 2015-06-30 DIAGNOSIS — E1122 Type 2 diabetes mellitus with diabetic chronic kidney disease: Secondary | ICD-10-CM | POA: Diagnosis present

## 2015-06-30 DIAGNOSIS — I10 Essential (primary) hypertension: Secondary | ICD-10-CM | POA: Diagnosis present

## 2015-06-30 DIAGNOSIS — K7581 Nonalcoholic steatohepatitis (NASH): Secondary | ICD-10-CM | POA: Diagnosis present

## 2015-06-30 DIAGNOSIS — I5032 Chronic diastolic (congestive) heart failure: Secondary | ICD-10-CM | POA: Diagnosis present

## 2015-06-30 DIAGNOSIS — Z6841 Body Mass Index (BMI) 40.0 and over, adult: Secondary | ICD-10-CM

## 2015-06-30 DIAGNOSIS — K7460 Unspecified cirrhosis of liver: Secondary | ICD-10-CM | POA: Diagnosis present

## 2015-06-30 DIAGNOSIS — Z7682 Awaiting organ transplant status: Secondary | ICD-10-CM

## 2015-06-30 DIAGNOSIS — D6959 Other secondary thrombocytopenia: Secondary | ICD-10-CM | POA: Diagnosis present

## 2015-06-30 DIAGNOSIS — Z800 Family history of malignant neoplasm of digestive organs: Secondary | ICD-10-CM

## 2015-06-30 DIAGNOSIS — J90 Pleural effusion, not elsewhere classified: Secondary | ICD-10-CM | POA: Diagnosis present

## 2015-06-30 LAB — CBC WITH DIFFERENTIAL/PLATELET
BASOS PCT: 0 %
Basophils Absolute: 0 10*3/uL (ref 0.0–0.1)
Eosinophils Absolute: 0.3 10*3/uL (ref 0.0–0.7)
Eosinophils Relative: 4 %
HEMATOCRIT: 36.5 % — AB (ref 39.0–52.0)
HEMOGLOBIN: 12.5 g/dL — AB (ref 13.0–17.0)
LYMPHS ABS: 1.1 10*3/uL (ref 0.7–4.0)
LYMPHS PCT: 16 %
MCH: 32.1 pg (ref 26.0–34.0)
MCHC: 34.2 g/dL (ref 30.0–36.0)
MCV: 93.6 fL (ref 78.0–100.0)
MONOS PCT: 25 %
Monocytes Absolute: 1.8 10*3/uL — ABNORMAL HIGH (ref 0.1–1.0)
NEUTROS ABS: 3.9 10*3/uL (ref 1.7–7.7)
NEUTROS PCT: 55 %
Platelets: 105 10*3/uL — ABNORMAL LOW (ref 150–400)
RBC: 3.9 MIL/uL — ABNORMAL LOW (ref 4.22–5.81)
RDW: 13.9 % (ref 11.5–15.5)
WBC: 7.1 10*3/uL (ref 4.0–10.5)

## 2015-06-30 LAB — COMPREHENSIVE METABOLIC PANEL
ALBUMIN: 2.6 g/dL — AB (ref 3.5–5.0)
ALT: 20 U/L (ref 17–63)
AST: 35 U/L (ref 15–41)
Alkaline Phosphatase: 103 U/L (ref 38–126)
Anion gap: 11 (ref 5–15)
BILIRUBIN TOTAL: 2.7 mg/dL — AB (ref 0.3–1.2)
BUN: 22 mg/dL — AB (ref 6–20)
CHLORIDE: 105 mmol/L (ref 101–111)
CO2: 19 mmol/L — ABNORMAL LOW (ref 22–32)
Calcium: 8.8 mg/dL — ABNORMAL LOW (ref 8.9–10.3)
Creatinine, Ser: 1.49 mg/dL — ABNORMAL HIGH (ref 0.61–1.24)
GFR calc Af Amer: 55 mL/min — ABNORMAL LOW (ref >60)
GFR calc non Af Amer: 48 mL/min — ABNORMAL LOW (ref >60)
GLUCOSE: 72 mg/dL (ref 65–99)
POTASSIUM: 4.6 mmol/L (ref 3.5–5.1)
Sodium: 135 mmol/L (ref 135–145)
Total Protein: 5.7 g/dL — ABNORMAL LOW (ref 6.5–8.1)

## 2015-06-30 LAB — BRAIN NATRIURETIC PEPTIDE: B Natriuretic Peptide: 80.9 pg/mL (ref 0.0–100.0)

## 2015-06-30 LAB — TROPONIN I

## 2015-06-30 LAB — I-STAT CG4 LACTIC ACID, ED: LACTIC ACID, VENOUS: 1.56 mmol/L (ref 0.5–2.0)

## 2015-06-30 NOTE — ED Provider Notes (Signed)
CSN: [id number]     Arrival date & time 06/30/15  [id number] History   By signing my name below, I, Altamease Oiler, attest that this documentation has been prepared under the direction and in the presence of Ripley Fraise, MD. Electronically Signed: Altamease Oiler, ED Scribe. 06/30/2015. 11:29 PM    Chief Complaint  Patient presents with  . Shortness of Breath   Patient is a 65 y.o. male presenting with shortness of breath. The history is provided by the patient and the spouse. No language interpreter was used.  Shortness of Breath Severity:  Moderate Onset quality:  Gradual Duration:  1 day Timing:  Constant Progression:  Worsening Chronicity:  Recurrent Relieved by: CPAP. Worsened by:  Exertion Ineffective treatments:  None tried Associated symptoms: cough   Associated symptoms: no abdominal pain, no chest pain, no fever and no vomiting   Risk factors: obesity    Peter Larson is a 65 y.o. male with history of CHF, DM, HTN, cirrhosis who presents to the Emergency Department complaining of recurrent, constant,  worsening SOB with onset today. Pt states that he was at [facility] yesterday where he had an [place], MRI, and paracentesis with 2.5 L of fluid removed. After the paracentesis he states that he was breathing well but today he has exertional SOB. His home CPAP provided some improvement in breathing.  Tonight, he called and was referred to the ED by his [place] gastroenterologist, Dr. Gerald Dexter, for IV diuretics after his CXR showed fluid on his lungs. Associated symptoms include dry cough. Pt denies fever, vomiting, chest pain, abdominal pain, new LE swelling, and weight gain over the last day.   Past Medical History  Diagnosis Date  . Sleep apnea   . Splenomegaly     slt thrombocytopenia;no complics. egd [id number] neg for varices, ?mild portal gastropathy; 02/2011 nl,novarices  . Diabetes mellitus without complication ([place])     type two  . Hypertension   . Adenomatous colon polyp '01 & '08  .  Hemorrhoids     Severe anorectal pain and anal fissure w bleeding following hemorrhoidectomy may 2010  . Family history of colon cancer     mother --37  . Seasonal allergies   . Screening     Hepatocellular Screening CT neg 05/2008, U/S neg 04/2009,05/2010  . Dysphagia     BaS/tab neg 8/09; egd's neg for ring/strict--?dysmotility  . Chronic diastolic congestive heart failure ([place])     a.  ECHO 01/13/14: EF 55-60%; moderate LVH, G2DD, biatrial enlargement; mild RVE; moderate MR; mildly elevated, pulm pressures.  . OSA (obstructive sleep apnea)   . Morbid obesity ([place])   . Frequent PVCs   . Pancytopenia ([place])   . 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  Marland Kitchen Shortness of breath   . Arthritis     RA  " ALL OVER "  . Dilatation of aorta (HCC)     a. Mild aortic root dilitation by echo 01/13/14  . Chronic hepatitis ([place])     cirrhosis  . Hepatic encephalopathy [facility])    Past Surgical History  Procedure Laterality Date  . Fissure surgery    . Hemorrhoid surgery    . Esophagogastroduodenoscopy (egd) with propofol N/A 03/11/2015    Procedure: ESOPHAGOGASTRODUODENOSCOPY (EGD) WITH PROPOFOL;  Surgeon: Ronald Lobo, MD;  Location: [facility] ENDOSCOPY;  Service: Endoscopy;  Laterality: N/A;  . Colonoscopy with propofol N/A 03/11/2015    Procedure: COLONOSCOPY WITH PROPOFOL;  Surgeon: Herbie Baltimore  Buccini, MD;  Location: [facility] ENDOSCOPY;  Service: Endoscopy;  Laterality: N/A;   Family History  Problem Relation Age of Onset  . Cancer Mother     died of breast cancer  . Liver disease Father   . Thyroid disease Sister    Social History  Substance Use Topics  . Smoking status: Never Smoker   . Smokeless tobacco: Never Used  . Alcohol Use: No    Review of Systems  Constitutional: Negative for fever and unexpected weight change.  Respiratory: Positive for cough and shortness of breath.   Cardiovascular: Negative for chest pain and leg swelling (nothing acute).   Gastrointestinal: Negative for nausea, vomiting and abdominal pain.  All other systems reviewed and are negative.  Allergies  Review of patient's allergies indicates no known allergies.  Home Medications   Prior to Admission medications   Medication Sig Start Date End Date Taking? Authorizing Provider  acetaminophen (TYLENOL ARTHRITIS PAIN) 650 MG CR tablet Take 1,300 mg by mouth every morning.   Yes Historical Provider, MD  cholecalciferol (VITAMIN D) 1000 UNITS tablet Take 3,000 Units by mouth every morning.    Yes Historical Provider, MD  esomeprazole (NEXIUM) 40 MG capsule Take 40 mg by mouth daily at 12 noon.   Yes Historical Provider, MD  famotidine (PEPCID) 20 MG tablet Take 1 tablet (20 mg total) by mouth as needed for heartburn or indigestion. 02/24/14  Yes Kinnie Feil, MD  furosemide (LASIX) 40 MG tablet Take 1 tablet (40 mg total) by mouth 2 (two) times daily. 02/01/14  Yes Dayna N Dunn, PA-C  lactulose (CHRONULAC) 10 GM/15ML solution Take 45 mLs (30 g total) by mouth 4 (four) times daily. 02/24/14  Yes Kinnie Feil, MD  milk thistle 175 MG tablet Take 350 mg by mouth every morning.    Yes Historical Provider, MD  Multiple Vitamin (MULTIVITAMIN) tablet Take 1 tablet by mouth every morning.    Yes Historical Provider, MD  rifaximin (XIFAXAN) 550 MG TABS tablet Take 1 tablet (550 mg total) by mouth 2 (two) times daily. 02/13/14  Yes Geradine Girt, DO  simethicone (MYLICON) 80 MG chewable tablet Chew 2 tablets (160 mg total) by mouth 4 (four) times daily as needed for flatulence. Patient taking differently: Chew 80 mg by mouth 2 (two) times daily.  02/24/14  Yes Kinnie Feil, MD  spironolactone (ALDACTONE) 50 MG tablet Take 1 tablet (50 mg total) by mouth 2 (two) times daily. 02/01/14  Yes Dayna N Dunn, PA-C   BP 136/49 mmHg  Pulse 52  Temp(Src) 97.9 F (36.6 C) (Oral)  Resp 18  Ht 6\' 1"  (1.854 m)  Wt 354 lb 5 oz (160.715 kg)  BMI 46.76 kg/m2  SpO2 95% Physical  Exam CONSTITUTIONAL: Well developed/well nourished HEAD: Normocephalic/atraumatic EYES: EOMI/PERRL ENMT: Mucous membranes moist NECK: supple no meningeal signs SPINE/BACK:entire spine nontender CV: S1/S2 noted, no murmurs/rubs/gallops noted LUNGS: Decreased breath sounds in right base, mild tachypnea noted ABDOMEN: Obese, soft, nontender, no rebound or guarding, bowel sounds noted throughout abdomen GU:no cva tenderness NEURO: Pt is awake/alert/appropriate, moves all extremitiesx4.  No facial droop.   EXTREMITIES: pulses normal/equal, full ROM, chronic pitting edema to bilateral lower extremities SKIN: warm, color normal PSYCH: no abnormalities of mood noted, alert and oriented to situation  ED Course  Procedures  DIAGNOSTIC STUDIES: Oxygen Saturation is 95% on RA,  normal by my interpretation.    COORDINATION OF CARE: 11:24 PM Discussed treatment plan which includes lab work, CXR,  EKG with pt at bedside and pt agreed to plan.  12:15 AM-Consult complete with Dr. Roel Cluck  [facility]), who is in the department. Patient case explained and discussed. Agrees to admit patient for further evaluation and treatment. Conversation ended at 12:17 AM 12:20 AM Pt to be admitted for diuresis/monitoring He reports this is similar to prior episodes and has required diuresis Gentle diuresis started due to renal insuffiencey    Labs Review Labs Reviewed  CBC WITH DIFFERENTIAL/PLATELET - Abnormal; Notable for the following:    RBC 3.90 (*)    Hemoglobin 12.5 (*)    HCT 36.5 (*)    Platelets 105 (*)    Monocytes Absolute 1.8 (*)    All other components within normal limits  COMPREHENSIVE METABOLIC PANEL - Abnormal; Notable for the following:    CO2 19 (*)    BUN 22 (*)    Creatinine, Ser 1.49 (*)    Calcium 8.8 (*)    Total Protein 5.7 (*)    Albumin 2.6 (*)    Total Bilirubin 2.7 (*)    GFR calc non Af Amer 48 (*)    GFR calc Af Amer 55 (*)    All other components within normal  limits  BRAIN NATRIURETIC PEPTIDE  TROPONIN I  I-STAT CG4 LACTIC ACID, ED  I-STAT CG4 LACTIC ACID, ED    Imaging Review Dg Chest 2 View  06/30/2015  CLINICAL DATA:  Shortness of breath for 2 weeks.  Initial encounter. EXAM: CHEST  2 VIEW COMPARISON:  PA and lateral chest 06/10/2015. FINDINGS: Small bilateral pleural effusions are present, greater on the right. Both effusions have increased since the prior study. There is associated basilar atelectasis. Heart size is normal. No pneumothorax is identified. IMPRESSION: Small pleural effusions, greater on the right, are increased since the comparison study. No other change. Electronically Signed   By: Inge Rise M.D.   On: 06/30/2015 20:22   I have personally reviewed and evaluated these images and lab results as part of my medical decision-making.   EKG Interpretation   Date/Time:  Wednesday June 30 2015 19:51:08 EST Ventricular Rate:  89 PR Interval:  192 QRS Duration: 86 QT Interval:  360 QTC Calculation: 438 R Axis:   -6 Text Interpretation:  Sinus rhythm with Premature atrial complexes with  Abberant conduction Minimal voltage criteria for LVH, may be normal  variant Borderline ECG Confirmed by Christy Gentles  MD, Elenore Rota ([id number]) on  06/30/2015 11:16:03 PM     Medications  furosemide (LASIX) injection 40 mg (not administered)    MDM   Final diagnoses:  Pleural effusion, right  Renal insufficiency    Nursing notes including past medical history and social history reviewed and considered in documentation xrays/imaging reviewed by myself and considered during evaluation Labs/vital reviewed myself and considered during evaluation Previous records reviewed and considered    I personally performed the services described in this documentation, which was scribed in my presence. The recorded information has been reviewed and is accurate.       Ripley Fraise, MD 07/01/15 [id number]

## 2015-06-30 NOTE — ED Notes (Signed)
The pt is a liver problem pt.  He was at [facility] yesterday and they drew fluid off his abd.  He was called back today and was told that he needed more fluid taken off to come to the ed.  He was last seen here jan 5th for the same

## 2015-06-30 NOTE — ED Notes (Signed)
Patient ambulated in hallway, approximately 135ft to assess respiratory status. Pulse in 70s-80s, and o2 sat stayed between 94-95 on room air while ambulating.

## 2015-07-01 ENCOUNTER — Inpatient Hospital Stay (HOSPITAL_COMMUNITY): Payer: 59

## 2015-07-01 ENCOUNTER — Encounter (HOSPITAL_COMMUNITY): Payer: Self-pay | Admitting: Internal Medicine

## 2015-07-01 DIAGNOSIS — I509 Heart failure, unspecified: Secondary | ICD-10-CM

## 2015-07-01 DIAGNOSIS — K7460 Unspecified cirrhosis of liver: Secondary | ICD-10-CM | POA: Diagnosis present

## 2015-07-01 DIAGNOSIS — Z7682 Awaiting organ transplant status: Secondary | ICD-10-CM | POA: Diagnosis not present

## 2015-07-01 DIAGNOSIS — K739 Chronic hepatitis, unspecified: Secondary | ICD-10-CM | POA: Diagnosis present

## 2015-07-01 DIAGNOSIS — Z6841 Body Mass Index (BMI) 40.0 and over, adult: Secondary | ICD-10-CM | POA: Diagnosis not present

## 2015-07-01 DIAGNOSIS — I10 Essential (primary) hypertension: Secondary | ICD-10-CM | POA: Diagnosis not present

## 2015-07-01 DIAGNOSIS — J90 Pleural effusion, not elsewhere classified: Secondary | ICD-10-CM | POA: Diagnosis present

## 2015-07-01 DIAGNOSIS — G4733 Obstructive sleep apnea (adult) (pediatric): Secondary | ICD-10-CM | POA: Diagnosis present

## 2015-07-01 DIAGNOSIS — D6959 Other secondary thrombocytopenia: Secondary | ICD-10-CM | POA: Diagnosis present

## 2015-07-01 DIAGNOSIS — Z800 Family history of malignant neoplasm of digestive organs: Secondary | ICD-10-CM | POA: Diagnosis not present

## 2015-07-01 DIAGNOSIS — E119 Type 2 diabetes mellitus without complications: Secondary | ICD-10-CM | POA: Diagnosis not present

## 2015-07-01 DIAGNOSIS — D649 Anemia, unspecified: Secondary | ICD-10-CM | POA: Diagnosis present

## 2015-07-01 DIAGNOSIS — E1122 Type 2 diabetes mellitus with diabetic chronic kidney disease: Secondary | ICD-10-CM | POA: Diagnosis present

## 2015-07-01 DIAGNOSIS — I130 Hypertensive heart and chronic kidney disease with heart failure and stage 1 through stage 4 chronic kidney disease, or unspecified chronic kidney disease: Secondary | ICD-10-CM | POA: Diagnosis present

## 2015-07-01 DIAGNOSIS — K7581 Nonalcoholic steatohepatitis (NASH): Secondary | ICD-10-CM | POA: Diagnosis present

## 2015-07-01 DIAGNOSIS — R0602 Shortness of breath: Secondary | ICD-10-CM

## 2015-07-01 DIAGNOSIS — I5033 Acute on chronic diastolic (congestive) heart failure: Secondary | ICD-10-CM | POA: Diagnosis present

## 2015-07-01 DIAGNOSIS — N183 Chronic kidney disease, stage 3 (moderate): Secondary | ICD-10-CM | POA: Diagnosis present

## 2015-07-01 DIAGNOSIS — J948 Other specified pleural conditions: Secondary | ICD-10-CM | POA: Diagnosis not present

## 2015-07-01 LAB — CBC WITH DIFFERENTIAL/PLATELET
Basophils Absolute: 0 10*3/uL (ref 0.0–0.1)
Basophils Relative: 0 %
Eosinophils Absolute: 0.3 10*3/uL (ref 0.0–0.7)
Eosinophils Relative: 5 %
HEMATOCRIT: 36.9 % — AB (ref 39.0–52.0)
HEMOGLOBIN: 12.4 g/dL — AB (ref 13.0–17.0)
LYMPHS ABS: 0.9 10*3/uL (ref 0.7–4.0)
Lymphocytes Relative: 14 %
MCH: 31.6 pg (ref 26.0–34.0)
MCHC: 33.6 g/dL (ref 30.0–36.0)
MCV: 94.1 fL (ref 78.0–100.0)
MONOS PCT: 21 %
Monocytes Absolute: 1.4 10*3/uL — ABNORMAL HIGH (ref 0.1–1.0)
NEUTROS ABS: 4 10*3/uL (ref 1.7–7.7)
NEUTROS PCT: 61 %
Platelets: 101 10*3/uL — ABNORMAL LOW (ref 150–400)
RBC: 3.92 MIL/uL — ABNORMAL LOW (ref 4.22–5.81)
RDW: 13.9 % (ref 11.5–15.5)
WBC: 6.7 10*3/uL (ref 4.0–10.5)

## 2015-07-01 LAB — GLUCOSE, CAPILLARY
GLUCOSE-CAPILLARY: 89 mg/dL (ref 65–99)
GLUCOSE-CAPILLARY: 101 mg/dL — AB (ref 65–99)
Glucose-Capillary: 111 mg/dL — ABNORMAL HIGH (ref 65–99)
Glucose-Capillary: 104 mg/dL — ABNORMAL HIGH (ref 65–99)

## 2015-07-01 LAB — COMPREHENSIVE METABOLIC PANEL
ALT: 20 U/L (ref 17–63)
ANION GAP: 11 (ref 5–15)
AST: 37 U/L (ref 15–41)
Albumin: 2.6 g/dL — ABNORMAL LOW (ref 3.5–5.0)
Alkaline Phosphatase: 100 U/L (ref 38–126)
BUN: 24 mg/dL — ABNORMAL HIGH (ref 6–20)
CHLORIDE: 104 mmol/L (ref 101–111)
CO2: 23 mmol/L (ref 22–32)
Calcium: 8.9 mg/dL (ref 8.9–10.3)
Creatinine, Ser: 1.52 mg/dL — ABNORMAL HIGH (ref 0.61–1.24)
GFR calc non Af Amer: 47 mL/min — ABNORMAL LOW (ref >60)
GFR, EST AFRICAN AMERICAN: 54 mL/min — AB (ref >60)
Glucose, Bld: 102 mg/dL — ABNORMAL HIGH (ref 65–99)
POTASSIUM: 4.9 mmol/L (ref 3.5–5.1)
SODIUM: 138 mmol/L (ref 135–145)
Total Bilirubin: 3 mg/dL — ABNORMAL HIGH (ref 0.3–1.2)
Total Protein: 6.2 g/dL — ABNORMAL LOW (ref 6.5–8.1)

## 2015-07-01 LAB — NA AND K (SODIUM & POTASSIUM), RAND UR
Potassium Urine: 17 mmol/L
Sodium, Ur: 99 mmol/L

## 2015-07-01 LAB — PROTIME-INR
INR: 1.41 (ref 0.00–1.49)
Prothrombin Time: 17.4 seconds — ABNORMAL HIGH (ref 11.6–15.2)

## 2015-07-01 LAB — TROPONIN I: Troponin I: <0.03 ng/mL

## 2015-07-01 LAB — CREATININE, URINE, RANDOM: Creatinine, Urine: 41.2 mg/dL

## 2015-07-01 LAB — BRAIN NATRIURETIC PEPTIDE: B Natriuretic Peptide: 89 pg/mL (ref 0.0–100.0)

## 2015-07-01 MED ORDER — FUROSEMIDE 10 MG/ML IJ SOLN
40.0000 mg | Freq: Two times a day (BID) | INTRAMUSCULAR | Status: DC
  Administered 2015-07-01 – 2015-07-03 (×4): 40 mg via INTRAVENOUS
  Filled 2015-07-01 (×4): qty 4

## 2015-07-01 MED ORDER — SODIUM CHLORIDE 0.9 % IV SOLN: 250.0000 mL | INTRAVENOUS | Status: DC | PRN

## 2015-07-01 MED ORDER — SODIUM CHLORIDE 0.9% FLUSH: 3.0000 mL | INTRAVENOUS | Status: DC | PRN

## 2015-07-01 MED ORDER — ONDANSETRON HCL 4 MG/2ML IJ SOLN: 4.0000 mg | Freq: Four times a day (QID) | INTRAMUSCULAR | Status: DC | PRN

## 2015-07-01 MED ORDER — INSULIN ASPART 100 UNIT/ML ~~LOC~~ SOLN: 0.0000 [IU] | Freq: Every day | SUBCUTANEOUS | Status: DC

## 2015-07-01 MED ORDER — SIMETHICONE 80 MG PO CHEW
80.0000 mg | CHEWABLE_TABLET | Freq: Two times a day (BID) | ORAL | Status: DC
  Administered 2015-07-01 – 2015-07-03 (×4): 80 mg via ORAL
  Filled 2015-07-01 (×4): qty 1

## 2015-07-01 MED ORDER — FUROSEMIDE 10 MG/ML IJ SOLN
40.0000 mg | Freq: Once | INTRAMUSCULAR | Status: AC
Start: 2015-07-01 — End: 2015-07-01
  Administered 2015-07-01: 40 mg via INTRAVENOUS
  Filled 2015-07-01: qty 4

## 2015-07-01 MED ORDER — INSULIN ASPART 100 UNIT/ML ~~LOC~~ SOLN: 0.0000 [IU] | Freq: Three times a day (TID) | SUBCUTANEOUS | Status: DC

## 2015-07-01 MED ORDER — FUROSEMIDE 40 MG PO TABS
40.0000 mg | ORAL_TABLET | Freq: Two times a day (BID) | ORAL | Status: DC
  Administered 2015-07-01: 40 mg via ORAL
  Filled 2015-07-01: qty 1

## 2015-07-01 MED ORDER — RIFAXIMIN 550 MG PO TABS
550.0000 mg | ORAL_TABLET | Freq: Two times a day (BID) | ORAL | Status: DC
  Administered 2015-07-01 – 2015-07-03 (×5): 550 mg via ORAL
  Filled 2015-07-01 (×6): qty 1

## 2015-07-01 MED ORDER — SODIUM CHLORIDE 0.9% FLUSH
3.0000 mL | Freq: Two times a day (BID) | INTRAVENOUS | Status: DC
  Administered 2015-07-01 – 2015-07-03 (×5): 3 mL via INTRAVENOUS

## 2015-07-01 MED ORDER — LACTULOSE 10 GM/15ML PO SOLN
30.0000 g | Freq: Three times a day (TID) | ORAL | Status: DC
  Administered 2015-07-01 – 2015-07-02 (×5): 30 g via ORAL
  Filled 2015-07-01 (×5): qty 45

## 2015-07-01 MED ORDER — SPIRONOLACTONE 50 MG PO TABS
50.0000 mg | ORAL_TABLET | Freq: Two times a day (BID) | ORAL | Status: DC
  Administered 2015-07-01 – 2015-07-03 (×5): 50 mg via ORAL
  Filled 2015-07-01 (×5): qty 1

## 2015-07-01 MED ORDER — PANTOPRAZOLE SODIUM 40 MG PO TBEC
40.0000 mg | DELAYED_RELEASE_TABLET | Freq: Every day | ORAL | Status: DC
  Administered 2015-07-01 – 2015-07-03 (×3): 40 mg via ORAL
  Filled 2015-07-01 (×3): qty 1

## 2015-07-01 MED ORDER — ACETAMINOPHEN 325 MG PO TABS
650.0000 mg | ORAL_TABLET | ORAL | Status: DC | PRN
  Administered 2015-07-02: 650 mg via ORAL
  Filled 2015-07-01: qty 2

## 2015-07-01 NOTE — Progress Notes (Signed)
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 He underwent large volume paracentesis at [facility] on 1/24 with symptomatic relief. No SBP. He discussed with his [facility] gastroenterology team and was advised to come to the ED for further evaluation and management.   Assessment/Plan:   1. Multifactorial dyspnea: Possible acute on chronic diastolic CHF, small bilateral pleural effusions right >left, ascites, obesity, OSA and may have OHS. Received a dose of Lasix 40 mg IV 1 in the ED with some diuresis and symptomatic relief. Discussed extensively with patient's Gastroenterologist Dr. Pleas Patricia at [facility] who recommended IV diuresis with Lasix as renal functions tolerate and continue Aldactone. Monitor closely. Improving. 2. Stage III chronic kidney disease: Patient's creatinine apparently fluctuates between 1.3-1.5. Currently at baseline. Monitor closely while on diuretics. 3. Cirrhosis secondary to [facility] and possible hepatocellular carcinoma: Discussed with patient's primary gastroenterologist at [facility], Dr. Pleas Patricia: Stated that it has been difficult to manage his volume overload status i.e. renal insufficiency limiting diuretic use. There is suspicion for but not diagnostic of [place]. Meld score 18-19. Recommended stabilization and outpatient follow-up with them but to contact them if he were to decline. No significant ascites needing tap at this time. 4. Essential hypertension: Reasonable inpatient control. 5. OSA: Continue nightly CPAP. 6. Type II DM: Controlled. 7. Anemia and  thrombocytopenia: Likely related to problem #3. Stable. Follow CBCs. 8. Morbid obesity/Body mass index is 46.73 kg/(m^2).   DVT prophylaxis: SCD's Code Status: Full Family Communication: Discussed in detail with patient's spouse at bedside on 1/26. Disposition Plan: DC home when medically stable, possibly in the next 24-48 hours.   Consultants:  None  Procedures:  2-D echo 07/01/15: Study Conclusions  - Left ventricle: The cavity size was normal. Systolic function was normal. The estimated ejection fraction was in the range of 55% to 60%. Regional wall motion abnormalities cannot be excluded. - Mitral valve: There was moderate regurgitation. - Left atrium: The atrium was severely dilated. - Right ventricle: The cavity size was dilated. - Right atrium: The atrium was dilated.  Antimicrobials:  None   Subjective: Dyspnea on exertion feels better compared to admission. Denies chest pain. Leg swelling some better. No abdominal pain.  Objective: Filed Vitals:   07/01/15 0045 07/01/15 0102 07/01/15 0503 07/01/15 1240  BP: 134/56 143/58 139/57 125/52  Pulse:   82 65  Temp:  98.7 F (37.1 C) 98.4 F (36.9 C) 98.6 F (37 C)  TempSrc:  Oral Oral Oral  Resp: 20 20 18 18   Height:  6\' 1"  (1.854 m)    Weight:  160.619 kg (354 lb 1.6 oz)    SpO2: 95% 96% 92% 90%    Intake/Output Summary (Last 24 hours) at 07/01/15 1600 Last data filed at 07/01/15 1441  Gross per 24 hour  Intake    850 ml  Output   1554 ml  Net   -704 ml   Filed Weights   06/30/15 1928 07/01/15 0102  Weight: 160.715 kg (354 lb 5 oz) 160.619 kg (354 lb 1.6 oz)    Exam:  General exam: Moderately built and morbidly obese  male sitting up comfortably in chair this morning. Respiratory system: Reduced breath sounds in the bases but otherwise clear to auscultation. No increased work of breathing. Cardiovascular system: S1 & S2 heard, RRR. No JVD, murmurs, gallops, clicks. Chronic lower extremity edema  (hyperpigmentation and lichenification of skin) with edema extending up to lower anterior abdominal wall and sacrum. Gastrointestinal system: Abdomen is mildly distended, soft and nontender. Normal bowel sounds heard. Central nervous system: Alert and oriented. No focal neurological deficits. Extremities: Symmetric 5 x 5 power.   Data Reviewed: Basic Metabolic Panel:  Recent Labs Lab 06/30/15 1948 07/01/15 0414  NA 135 138  K 4.6 4.9  CL 105 104  CO2 19* 23  GLUCOSE 72 102*  BUN 22* 24*  CREATININE 1.49* 1.52*  CALCIUM 8.8* 8.9   Liver Function Tests:  Recent Labs Lab 06/30/15 1948 07/01/15 0414  AST 35 37  ALT 20 20  ALKPHOS 103 100  BILITOT 2.7* 3.0*  PROT 5.7* 6.2*  ALBUMIN 2.6* 2.6*   No results for input(s): LIPASE, AMYLASE in the last 168 hours. No results for input(s): AMMONIA in the last 168 hours. CBC:  Recent Labs Lab 06/30/15 1948 07/01/15 0414  WBC 7.1 6.7  NEUTROABS 3.9 4.0  HGB 12.5* 12.4*  HCT 36.5* 36.9*  MCV 93.6 94.1  PLT 105* 101*   Cardiac Enzymes:  Recent Labs Lab 06/30/15 1948 07/01/15 0414 07/01/15 0920  TROPONINI <0.03 <0.03 <0.03   BNP (last 3 results) No results for input(s): PROBNP in the last 8760 hours. CBG:  Recent Labs Lab 07/01/15 0632 07/01/15 1109  GLUCAP 89 101*    No results found for this or any previous visit (from the past 240 hour(s)).       Studies: Dg Chest 2 View  06/30/2015  CLINICAL DATA:  Shortness of breath for 2 weeks.  Initial encounter. EXAM: CHEST  2 VIEW COMPARISON:  PA and lateral chest 06/10/2015. FINDINGS: Small bilateral pleural effusions are present, greater on the right. Both effusions have increased since the prior study. There is associated basilar atelectasis. Heart size is normal. No pneumothorax is identified. IMPRESSION: Small pleural effusions, greater on the right, are increased since the comparison study. No other change. Electronically Signed   By: Inge Rise M.D.    On: 06/30/2015 20:22        Scheduled Meds: . furosemide  40 mg Intravenous BID  . insulin aspart  0-5 Units Subcutaneous QHS  . insulin aspart  0-9 Units Subcutaneous TID WC  . lactulose  30 g Oral TID AC & HS  . pantoprazole  40 mg Oral Daily  . rifaximin  550 mg Oral BID  . simethicone  80 mg Oral BID  . sodium chloride flush  3 mL Intravenous Q12H  . spironolactone  50 mg Oral BID   Continuous Infusions:   Active Problems:   OSA (obstructive sleep apnea)   Essential hypertension, benign   SOB (shortness of breath)   Acute on chronic diastolic congestive heart failure (HCC)   CKD (chronic kidney disease), stage III   Diabetes mellitus without complication (HCC)   Hypertension   Cirrhosis (HCC)   Pleural effusion on right   CHF exacerbation ([place])    Time spent: 25 minutes.    Vernell Leep, MD, FACP, FHM. [facility] Pager [phone]  If 7PM-7AM, please contact night-coverage [email or web address] Password [email or web address] 07/01/2015, 4:00 PM    LOS: 0 days

## 2015-07-01 NOTE — H&P (Signed)
PCP:  [name], MD  [facility] at [facility] dr. Gerald Dexter and [place] [phone],  [phone] Cardiology [place]  Referring provider [place]   Chief Complaint:  Shortness of breath  HPI: Peter Larson is a 65 y.o. male   has a past medical history of Sleep apnea; Splenomegaly; Diabetes mellitus without complication ([place]); Hypertension; Adenomatous colon polyp ('01 & '08); Hemorrhoids; Family history of colon cancer; Seasonal allergies; Screening; Dysphagia; Chronic diastolic congestive heart failure ([place]); OSA (obstructive sleep apnea); Morbid obesity ([place]); Frequent PVCs; Pancytopenia ([place]); Cirrhosis ([place]); Shortness of breath; Arthritis; Dilatation of aorta ([place]); Chronic hepatitis ([place]); and Hepatic encephalopathy ([place]).   Presented with 5-10 pound weight gain increasing lower extremity edema worsening shortness of breath and weakness. Reports he have had a large volume paracentesis done on 1/24 at [facility] per records no evidence of SBP,  SAAG 1.6 low protein. Since then his abdomen has decreased in girth but he still had some shortness of breath  . Records from [facility] patient's fluid status has been more difficult to control with diuresis. He's been having worsening renal function. MRI of the liver was done on 24 and was worrisome for slightly enlarging lesions concerning for [place].  In ER he was given lasix 40 mg IV he urinated about 500 ml. 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 Has history of obstructive sleep apnea on CPAP and known history of chronic kidney disease. Patient has known history of diastolic heart failure last echogram in August 2015 showed EF of 60% he has known history of pulmonary hypertension and grade 2 diastolic dysfunction. Home diuretics Hospitalist was called  for admission for worsening dyspnea for ectopy secondary to pleural effusion  Review of Systems:    Pertinent positives include: shortness of breath at rest. dyspnea on exertion,   Constitutional:  No weight loss, night sweats, Fevers, chills, fatigue, weight loss  HEENT:  No headaches, Difficulty swallowing,Tooth/dental problems,Sore throat,  No sneezing, itching, ear ache, nasal congestion, post nasal drip,  Cardio-vascular:  No chest pain, Orthopnea, PND, anasarca, dizziness, palpitations.no Bilateral lower extremity swelling  GI:  No heartburn, indigestion, abdominal pain, nausea, vomiting, diarrhea, change in bowel habits, loss of appetite, melena, blood in stool, hematemesis Resp:  no  No No excess mucus, no productive cough, No non-productive cough, No coughing up of blood.No change in color of mucus.No wheezing. Skin:  no rash or lesions. No jaundice GU:  no dysuria, change in color of urine, no urgency or frequency. No straining to urinate.  No flank pain.  Musculoskeletal:  No joint pain or no joint swelling. No decreased range of motion. No back pain.  Psych:  No change in mood or affect. No depression or anxiety. No memory loss.  Neuro: no localizing neurological complaints, no tingling, no weakness, no double vision, no gait abnormality, no slurred speech, no confusion  Otherwise ROS are negative except for above, 10 systems were reviewed  Past Medical History: Past Medical History  Diagnosis Date  . Sleep apnea   . Splenomegaly     slt thrombocytopenia;no complics. egd [id number] neg for varices, ?mild portal gastropathy; 02/2011 nl,novarices  . 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 Hypertension   . Adenomatous colon polyp '01 & '08  . Hemorrhoids  Severe anorectal pain and anal fissure w bleeding following hemorrhoidectomy may 2010  . Family history of colon cancer     mother --87  . Seasonal allergies   . Screening     Hepatocellular  Screening CT neg 05/2008, U/S neg 04/2009,05/2010  . Dysphagia     BaS/tab neg 8/09; egd's neg for ring/strict--?dysmotility  . Chronic diastolic congestive heart failure ([place])     a.  ECHO 01/13/14: EF 55-60%; moderate LVH, G2DD, biatrial enlargement; mild RVE; moderate MR; mildly elevated, pulm pressures.  . OSA (obstructive sleep apnea)   . Morbid obesity ([place])   . Frequent PVCs   . Pancytopenia ([place])   . Cirrhosis ([place])     a. Due to NASH, followed by [facility].  Marland Kitchen Shortness of breath   . Arthritis     RA  " ALL OVER "  . Dilatation of aorta (HCC)     a. Mild aortic root dilitation by echo 01/13/14  . Chronic hepatitis ([place])     cirrhosis  . Hepatic encephalopathy [facility])    Past Surgical History  Procedure Laterality Date  . Fissure surgery    . Hemorrhoid surgery    . Esophagogastroduodenoscopy (egd) with propofol N/A 03/11/2015    Procedure: ESOPHAGOGASTRODUODENOSCOPY (EGD) WITH PROPOFOL;  Surgeon: Ronald Lobo, MD;  Location: [facility] ENDOSCOPY;  Service: Endoscopy;  Laterality: N/A;  . Colonoscopy with propofol N/A 03/11/2015    Procedure: COLONOSCOPY WITH PROPOFOL;  Surgeon: Ronald Lobo, MD;  Location: [facility] ENDOSCOPY;  Service: Endoscopy;  Laterality: N/A;     Medications: Prior to Admission medications   Medication Sig Start Date End Date Taking? Authorizing Provider  acetaminophen (TYLENOL ARTHRITIS PAIN) 650 MG CR tablet Take 1,300 mg by mouth every morning.   Yes Historical Provider, MD  cholecalciferol (VITAMIN D) 1000 UNITS tablet Take 3,000 Units by mouth every morning.    Yes Historical Provider, MD  esomeprazole (NEXIUM) 40 MG capsule Take 40 mg by mouth daily at 12 noon.   Yes Historical Provider, MD  famotidine (PEPCID) 20 MG tablet Take 1 tablet (20 mg total) by mouth as needed for heartburn or indigestion. 02/24/14  Yes Kinnie Feil, MD  furosemide (LASIX) 40 MG tablet Take 1 tablet (40 mg total) by mouth 2 (two) times daily. 02/01/14  Yes Dayna N Dunn,  PA-C  lactulose (CHRONULAC) 10 GM/15ML solution Take 45 mLs (30 g total) by mouth 4 (four) times daily. 02/24/14  Yes Kinnie Feil, MD  milk thistle 175 MG tablet Take 350 mg by mouth every morning.    Yes Historical Provider, MD  Multiple Vitamin (MULTIVITAMIN) tablet Take 1 tablet by mouth every morning.    Yes Historical Provider, MD  rifaximin (XIFAXAN) 550 MG TABS tablet Take 1 tablet (550 mg total) by mouth 2 (two) times daily. 02/13/14  Yes Geradine Girt, DO  simethicone (MYLICON) 80 MG chewable tablet Chew 2 tablets (160 mg total) by mouth 4 (four) times daily as needed for flatulence. Patient taking differently: Chew 80 mg by mouth 2 (two) times daily.  02/24/14  Yes Kinnie Feil, MD  spironolactone (ALDACTONE) 50 MG tablet Take 1 tablet (50 mg total) by mouth 2 (two) times daily. 02/01/14  Yes Dayna N Dunn, PA-C    Allergies:  No Known Allergies  Social History:  Ambulatory  independently   Lives at home  With family     reports that he has never smoked. He has never used smokeless tobacco. He  reports that he does not drink alcohol or use illicit drugs.     Family History: family history includes Cancer in his mother; Liver disease in his father; Thyroid disease in his sister.    Physical Exam: Patient Vitals for the past 24 hrs:  BP Temp Temp src Pulse Resp SpO2 Height Weight  06/30/15 2330 163/60 mmHg - - - 23 99 % - -  06/30/15 2303 (!) 138/48 mmHg - - 85 18 95 % - -  06/30/15 2300 (!) 138/48 mmHg - - - 24 96 % - -  06/30/15 2237 (!) 136/49 mmHg - - (!) 52 18 95 % - -  06/30/15 1928 185/61 mmHg 97.9 F (36.6 C) Oral 86 24 94 % 6\' 1"  (1.854 m) (!) 160.715 kg (354 lb 5 oz)    1. General:  in No Acute distress 2. Psychological: Alert and   Oriented 3. Head/ENT:   Mois Mucous Membranes                          Head Non traumatic, neck supple                          Normal  Dentition 4. SKIN: normal   Skin turgor,  Skin clean Dry and intact no rash 5. Heart:  Regular rate and rhythm no Murmur, Rub or gallop 6. Lungs:   no wheezes and minimal crackles  and diminished breath sounds at the bases 7. Abdomen: Soft, non-tender, Non distended 8. Lower extremities: no clubbing, cyanosis, 2+ edema going up to the lower abdomen 9. Neurologically Grossly intact, moving all 4 extremities equally 10. MSK: Normal range of motion  body mass index is 46.76 kg/(m^2).   Labs on Admission:   Results for orders placed or performed during the hospital encounter of 06/30/15 (from the past 24 hour(s))  CBC with Differential     Status: Abnormal   Collection Time: 06/30/15  7:48 PM  Result Value Ref Range   WBC 7.1 4.0 - 10.5 K/uL   RBC 3.90 (L) 4.22 - 5.81 MIL/uL   Hemoglobin 12.5 (L) 13.0 - 17.0 g/dL   HCT 36.5 (L) 39.0 - 52.0 %   MCV 93.6 78.0 - 100.0 fL   MCH 32.1 26.0 - 34.0 pg   MCHC 34.2 30.0 - 36.0 g/dL   RDW 13.9 11.5 - 15.5 %   Platelets 105 (L) 150 - 400 K/uL   Neutrophils Relative % 55 %   Neutro Abs 3.9 1.7 - 7.7 K/uL   Lymphocytes Relative 16 %   Lymphs Abs 1.1 0.7 - 4.0 K/uL   Monocytes Relative 25 %   Monocytes Absolute 1.8 (H) 0.1 - 1.0 K/uL   Eosinophils Relative 4 %   Eosinophils Absolute 0.3 0.0 - 0.7 K/uL   Basophils Relative 0 %   Basophils Absolute 0.0 0.0 - 0.1 K/uL  Brain natriuretic peptide     Status: None   Collection Time: 06/30/15  7:48 PM  Result Value Ref Range   B Natriuretic Peptide 80.9 0.0 - 100.0 pg/mL  Troponin I     Status: None   Collection Time: 06/30/15  7:48 PM  Result Value Ref Range   Troponin I <0.03 <0.031 ng/mL  Comprehensive metabolic panel     Status: Abnormal   Collection Time: 06/30/15  7:48 PM  Result Value Ref Range   Sodium 135 135 - 145 mmol/L  Potassium 4.6 3.5 - 5.1 mmol/L   Chloride 105 101 - 111 mmol/L   CO2 19 (L) 22 - 32 mmol/L   Glucose, Bld 72 65 - 99 mg/dL   BUN 22 (H) 6 - 20 mg/dL   Creatinine, Ser 1.49 (H) 0.61 - 1.24 mg/dL   Calcium 8.8 (L) 8.9 - 10.3 mg/dL   Total Protein  5.7 (L) 6.5 - 8.1 g/dL   Albumin 2.6 (L) 3.5 - 5.0 g/dL   AST 35 15 - 41 U/L   ALT 20 17 - 63 U/L   Alkaline Phosphatase 103 38 - 126 U/L   Total Bilirubin 2.7 (H) 0.3 - 1.2 mg/dL   GFR calc non Af Amer 48 (L) >60 mL/min   GFR calc Af Amer 55 (L) >60 mL/min   Anion gap 11 5 - 15  I-Stat CG4 Lactic Acid, ED     Status: None   Collection Time: 06/30/15  7:52 PM  Result Value Ref Range   Lactic Acid, Venous 1.56 0.5 - 2.0 mmol/L    UA not obtained  No results found for: HGBA1C  Estimated Creatinine Clearance: 79.5 mL/min (by C-G formula based on Cr of 1.49).  BNP (last 3 results) No results for input(s): PROBNP in the last 8760 hours.  Other results:  I have pearsonaly reviewed this: ECG REPORT  Rate: 89  Rhythm: Sinus rhythm with PACs ST&T Change: No ischemic changes Normal QTC of 438  Filed Weights   06/30/15 1928  Weight: 160.715 kg (354 lb 5 oz)     Cultures:    Component Value Date/Time   SDES URINE, RANDOM 02/12/2014 0322   SPECREQUEST NONE 02/12/2014 0322   CULT  02/12/2014 0322    VIRIDANS STREPTOCOCCUS Performed at [place] 02/13/2014 FINAL 02/12/2014 0322     Radiological Exams on Admission: Dg Chest 2 View  06/30/2015  CLINICAL DATA:  Shortness of breath for 2 weeks.  Initial encounter. EXAM: CHEST  2 VIEW COMPARISON:  PA and lateral chest 06/10/2015. FINDINGS: Small bilateral pleural effusions are present, greater on the right. Both effusions have increased since the prior study. There is associated basilar atelectasis. Heart size is normal. No pneumothorax is identified. IMPRESSION: Small pleural effusions, greater on the right, are increased since the comparison study. No other change. Electronically Signed   By: Inge Rise M.D.   On: 06/30/2015 20:22   The MRI abdomen with and without contrast from 1/24 shows: Hepatic findings: 1. The hyperenhancing LI-RADS 3 lesion in segment 2 has slightly increased in size, now  measuring 1.9 x 1.9 cm. While by size criteria this does not meet criteria for threshold growth, this is concerning for [facility]. Close attention on follow up for this lesion is recommended; given the limitations on MRI, CT is recommended for subsequent examinations.  2. The remaining LI-RADS 3 lesions have not significantly changed compared to priors. 3. Cirrhosis and portal hypertension.  Extrahepatic findings: 1. Cholelithiasis.  2. Moderate right and small left pleural effusions, increased from prior.    Chart has been reviewed  Family   at  Bedside  plan of care was discussed with Wife lilton nolasco [phone]   Assessment/Plan  65 year old gentleman history of cirrhosis secondary to NASH followed by [facility] transplant team currently on transplant list with recent MRI of the liver worrisome for [facility] known history of portal hypertension with difficult to control peripheral edema and ascites with known history of chronic kidney disease Baseline creatinine  1.3, mild diabetes diet-controlled , passed out heart failure and essential hypertension sleep apnea on C Pap presents with worsening shortness of breath secondary to pleural effusions admitted for mild diuresis Present on Admission:  . Pleural effusion on right - given Lasix 40 mg IV with  some diuresis achieved but continues to be somewhat short of breath. Given worsening renal function hold off on further aggressive diuresis and continue home regimen for night O need to discuss in the morning with primary transplant team possible options including thoracentesis  . Cirrhosis ([place]) - chronic secondary to an [place] followed by [facility]. We'll need to discuss with [facility] plan of care in the morning  . Acute on chronic diastolic congestive heart failure (HCC) - current fluid overload possibly secondary to cirrhosis versus diastolic heart failure. We'll repeat echo gram monitor on telemetry  . CKD (chronic kidney disease), stage III - worsening renal  function despite peripheral edema and fluid overload concerning for hepatorenal syndrome will obtain urinary electrolytes. May benefit from renal consult to help manage  . Essential hypertension, benign continue home medications currently stable  . OSA (obstructive sleep apnea) on CPAP . SOB (shortness of breath) slightly secondary to worsening pleural effusions currently stable comfortable in bed.    We'll need to discuss with primary transplant team how to address fluid overload his out causing worsening renal failure   Prophylaxis: SCD   CODE STATUS:  FULL CODE  as per patient    Disposition:   To home once workup is complete and patient is stable  Other plan as per orders.  I have spent a total of 68 min on this admission extra time was taken to review records  [place] 07/01/2015, 3:41 AM    [facility]  Pager [phone]   after 2 AM please page floor coverage PA If 7AM-7PM, please contact the day team taking care of the patient  [email or web address]  Password [email or web address]

## 2015-07-01 NOTE — ED Notes (Signed)
Explained strict in and out measurements and provided urinal

## 2015-07-01 NOTE — Progress Notes (Signed)
  Echocardiogram 2D Echocardiogram has been performed.  Peter Larson 07/01/2015, 10:36 AM

## 2015-07-01 NOTE — Progress Notes (Signed)
Utilization review completed. Robynn Marcel, RN, BSN. 

## 2015-07-01 NOTE — Progress Notes (Signed)
Pt. placed on CPAP/FFM per order @6  cmh20 on room air, tolerating well, Rt to monitor.

## 2015-07-02 DIAGNOSIS — G4733 Obstructive sleep apnea (adult) (pediatric): Secondary | ICD-10-CM

## 2015-07-02 DIAGNOSIS — I10 Essential (primary) hypertension: Secondary | ICD-10-CM

## 2015-07-02 DIAGNOSIS — I5033 Acute on chronic diastolic (congestive) heart failure: Secondary | ICD-10-CM

## 2015-07-02 DIAGNOSIS — N183 Chronic kidney disease, stage 3 (moderate): Secondary | ICD-10-CM

## 2015-07-02 DIAGNOSIS — K7460 Unspecified cirrhosis of liver: Secondary | ICD-10-CM

## 2015-07-02 LAB — BASIC METABOLIC PANEL
ANION GAP: 9 (ref 5–15)
BUN: 22 mg/dL — ABNORMAL HIGH (ref 6–20)
CHLORIDE: 104 mmol/L (ref 101–111)
CO2: 24 mmol/L (ref 22–32)
Calcium: 8.7 mg/dL — ABNORMAL LOW (ref 8.9–10.3)
Creatinine, Ser: 1.53 mg/dL — ABNORMAL HIGH (ref 0.61–1.24)
GFR calc non Af Amer: 46 mL/min — ABNORMAL LOW (ref >60)
GFR, EST AFRICAN AMERICAN: 54 mL/min — AB (ref >60)
Glucose, Bld: 93 mg/dL (ref 65–99)
POTASSIUM: 4.6 mmol/L (ref 3.5–5.1)
SODIUM: 137 mmol/L (ref 135–145)

## 2015-07-02 LAB — GLUCOSE, CAPILLARY
GLUCOSE-CAPILLARY: 100 mg/dL — AB (ref 65–99)
GLUCOSE-CAPILLARY: 109 mg/dL — AB (ref 65–99)
GLUCOSE-CAPILLARY: 80 mg/dL (ref 65–99)
Glucose-Capillary: 91 mg/dL (ref 65–99)

## 2015-07-02 MED ORDER — LACTULOSE 10 GM/15ML PO SOLN
30.0000 g | Freq: Two times a day (BID) | ORAL | Status: DC
  Administered 2015-07-02: 30 g via ORAL
  Filled 2015-07-02 (×2): qty 45

## 2015-07-02 NOTE — Progress Notes (Signed)
PROGRESS NOTE    Peter Larson [id number] DOB: 1950-09-26 DOA: 06/30/2015 PCP: [name], MD  HPI/Brief narrative 65 year old male with history of type II DM, HTN, chronic diastolic CHF, OSA, morbid obesity, cirrhosis due to NASH complicated by ascites, recent MRI abdomen suspicious for hepatocellular carcinoma, followed at [facility] [place], referred and yet to be seen by transplant team, presented to [facility] ED on 06/30/15 for worsening dyspnea on exertion and orthopnea. He underwent large volume paracentesis at [facility] on 1/24 with symptomatic relief. No SBP. He discussed with his [facility] gastroenterology team and was advised to come to the ED for further evaluation and management.   Assessment/Plan:   1. Multifactorial dyspnea: Possible acute on chronic diastolic CHF, small bilateral pleural effusions right >left, ascites, obesity, OSA and may have OHS. Received a dose of Lasix 40 mg IV 1 in the ED with some diuresis and symptomatic relief. Discussed extensively with patient's Gastroenterologist Dr. Pleas Patricia at [facility] on 1/26 who recommended IV diuresis with Lasix as renal functions tolerate and continue Aldactone. Monitor closely. Improving. Continue additional 24 hours of IV Lasix. -1.195 since admission. 2. Stage III chronic kidney disease: Patient's creatinine apparently fluctuates between 1.3-1.5. Currently at baseline. Monitor closely while on diuretics. Creatinine stable at 1.5. 3. Cirrhosis secondary to [facility] and possible hepatocellular carcinoma: Discussed with patient's primary gastroenterologist at [facility], Dr. Pleas Patricia: Stated that it has been difficult to manage his volume overload status i.e. renal insufficiency limiting diuretic use. There is suspicion for but not diagnostic of [place]. Meld score 18-19. Recommended stabilization and outpatient follow-up with them but to contact them if he were to decline. No significant ascites needing tap at this time. Patient states that he had 6-7 BMs in the  last 24 hours. Reduced lactulose. 4. Essential hypertension: Reasonable inpatient control. 5. OSA: Continue nightly CPAP. 6. Type II DM: Controlled. 7. Anemia and thrombocytopenia: Likely related to problem #3. Stable. Follow CBCs as outpatient. 8. Morbid obesity/Body mass index is 45.79 kg/(m^2).   DVT prophylaxis: SCD's Code Status: Full Family Communication: Discussed in detail with patient's spouse at bedside on 1/27. Disposition Plan: DC home when medically stable, possibly 1/28   Consultants:  None  Procedures:  2-D echo 07/01/15: Study Conclusions  - Left ventricle: The cavity size was normal. Systolic function was normal. The estimated ejection fraction was in the range of 55% to 60%. Regional wall motion abnormalities cannot be excluded. - Mitral valve: There was moderate regurgitation. - Left atrium: The atrium was severely dilated. - Right ventricle: The cavity size was dilated. - Right atrium: The atrium was dilated.  Antimicrobials:  None   Subjective: States that he feels better. Able to lie flat and sleep last night for 4-5 hours. Improved dyspnea and leg swellings. 6-7 BMs in the last 24 hours.  Objective: Filed Vitals:   07/02/15 0326 07/02/15 0703 07/02/15 0858 07/02/15 1100  BP:  121/52 127/47 115/56  Pulse:  68 80 82  Temp:  97.7 F (36.5 C) 98.1 F (36.7 C) 98.2 F (36.8 C)  TempSrc:  Oral Oral Oral  Resp:  20  18  Height:      Weight: 157.398 kg (347 lb)     SpO2:  94% 94% 96%    Intake/Output Summary (Last 24 hours) at 07/02/15 1313 Last data filed at 07/02/15 1311  Gross per 24 hour  Intake   1550 ml  Output   2291 ml  Net   -741 ml   [employer]  06/30/15 1928 07/01/15 0102 07/02/15 0326  Weight: 160.715 kg (354 lb 5 oz) 160.619 kg (354 lb 1.6 oz) 157.398 kg (347 lb)    Exam:  General exam: Moderately built and morbidly obese male sitting up comfortably in chair this morning. Respiratory system: Reduced breath sounds  in the bases but otherwise clear to auscultation. No increased work of breathing. Cardiovascular system: S1 & S2 heard, RRR. No JVD, murmurs, gallops, clicks. Chronic lower extremity edema (hyperpigmentation and lichenification of skin) with edema extending up to lower anterior abdominal wall and sacrum - all improving. Telemetry: Sinus rhythm with PACs/PVCs. Gastrointestinal system: Abdomen is mildly distended, soft and nontender. Normal bowel sounds heard. Central nervous system: Alert and oriented. No focal neurological deficits. Extremities: Symmetric 5 x 5 power.   Data Reviewed: Basic Metabolic Panel:  Recent Labs Lab 06/30/15 1948 07/01/15 0414 07/02/15 0405  NA 135 138 137  K 4.6 4.9 4.6  CL 105 104 104  CO2 19* 23 24  GLUCOSE 72 102* 93  BUN 22* 24* 22*  CREATININE 1.49* 1.52* 1.53*  CALCIUM 8.8* 8.9 8.7*   Liver Function Tests:  Recent Labs Lab 06/30/15 1948 07/01/15 0414  AST 35 37  ALT 20 20  ALKPHOS 103 100  BILITOT 2.7* 3.0*  PROT 5.7* 6.2*  ALBUMIN 2.6* 2.6*   No results for input(s): LIPASE, AMYLASE in the last 168 hours. No results for input(s): AMMONIA in the last 168 hours. CBC:  Recent Labs Lab 06/30/15 1948 07/01/15 0414  WBC 7.1 6.7  NEUTROABS 3.9 4.0  HGB 12.5* 12.4*  HCT 36.5* 36.9*  MCV 93.6 94.1  PLT 105* 101*   Cardiac Enzymes:  Recent Labs Lab 06/30/15 1948 07/01/15 0414 07/01/15 0920 07/01/15 1519  TROPONINI <0.03 <0.03 <0.03 <0.03   BNP (last 3 results) No results for input(s): PROBNP in the last 8760 hours. CBG:  Recent Labs Lab 07/01/15 1109 07/01/15 1557 07/01/15 2055 07/02/15 0643 07/02/15 1110  GLUCAP 101* 111* 104* 100* 109*    No results found for this or any previous visit (from the past 240 hour(s)).       Studies: Dg Chest 2 View  06/30/2015  CLINICAL DATA:  Shortness of breath for 2 weeks.  Initial encounter. EXAM: CHEST  2 VIEW COMPARISON:  PA and lateral chest 06/10/2015. FINDINGS: Small  bilateral pleural effusions are present, greater on the right. Both effusions have increased since the prior study. There is associated basilar atelectasis. Heart size is normal. No pneumothorax is identified. IMPRESSION: Small pleural effusions, greater on the right, are increased since the comparison study. No other change. Electronically Signed   By: Inge Rise M.D.   On: 06/30/2015 20:22        Scheduled Meds: . furosemide  40 mg Intravenous BID  . insulin aspart  0-9 Units Subcutaneous TID WC  . lactulose  30 g Oral BID  . pantoprazole  40 mg Oral Daily  . rifaximin  550 mg Oral BID  . simethicone  80 mg Oral BID  . sodium chloride flush  3 mL Intravenous Q12H  . spironolactone  50 mg Oral BID   Continuous Infusions:   Active Problems:   OSA (obstructive sleep apnea)   Essential hypertension, benign   SOB (shortness of breath)   Acute on chronic diastolic congestive heart failure (HCC)   CKD (chronic kidney disease), stage III   Diabetes mellitus without complication (HCC)   Hypertension   Cirrhosis (HCC)   Pleural effusion on right  CHF exacerbation ([place])    Time spent: 25 minutes.    Vernell Leep, MD, FACP, FHM. [facility] Pager [phone]  If 7PM-7AM, please contact night-coverage [email or web address] Password [email or web address] 07/02/2015, 1:13 PM    LOS: 1 day

## 2015-07-03 DIAGNOSIS — E119 Type 2 diabetes mellitus without complications: Secondary | ICD-10-CM

## 2015-07-03 DIAGNOSIS — J948 Other specified pleural conditions: Secondary | ICD-10-CM

## 2015-07-03 LAB — BASIC METABOLIC PANEL
Anion gap: 6 (ref 5–15)
BUN: 23 mg/dL — ABNORMAL HIGH (ref 6–20)
CHLORIDE: 105 mmol/L (ref 101–111)
CO2: 25 mmol/L (ref 22–32)
CREATININE: 1.49 mg/dL — AB (ref 0.61–1.24)
Calcium: 8.8 mg/dL — ABNORMAL LOW (ref 8.9–10.3)
GFR calc non Af Amer: 48 mL/min — ABNORMAL LOW (ref >60)
GFR, EST AFRICAN AMERICAN: 55 mL/min — AB (ref >60)
GLUCOSE: 85 mg/dL (ref 65–99)
Potassium: 4.6 mmol/L (ref 3.5–5.1)
Sodium: 136 mmol/L (ref 135–145)

## 2015-07-03 LAB — GLUCOSE, CAPILLARY: Glucose-Capillary: 85 mg/dL (ref 65–99)

## 2015-07-03 MED ORDER — SIMETHICONE 80 MG PO CHEW: 80.0000 mg | CHEWABLE_TABLET | Freq: Two times a day (BID) | ORAL | Status: AC

## 2015-07-03 NOTE — Progress Notes (Signed)
Pt discharged home, IV and tele removed. Questions answered Discharge instructions given.

## 2015-07-03 NOTE — Discharge Instructions (Signed)
Heart Failure °Heart failure is a condition in which the heart has trouble pumping blood. This means your heart does not pump blood efficiently for your body to work well. In some cases of heart failure, fluid may back up into your lungs or you may have swelling (edema) in your lower legs. Heart failure is usually a long-term (chronic) condition. It is important for you to take good care of yourself and follow your health care provider's treatment plan. °CAUSES  °Some health conditions can cause heart failure. Those health conditions include: °· High blood pressure (hypertension). Hypertension causes the heart muscle to work harder than normal. When pressure in the blood vessels is high, the heart needs to pump (contract) with more force in order to circulate blood throughout the body. High blood pressure eventually causes the heart to become stiff and weak. °· Coronary artery disease (CAD). CAD is the buildup of cholesterol and fat (plaque) in the arteries of the heart. The blockage in the arteries deprives the heart muscle of oxygen and blood. This can cause chest pain and may lead to a heart attack. High blood pressure can also contribute to CAD. °· Heart attack (myocardial infarction). A heart attack occurs when one or more arteries in the heart become blocked. The loss of oxygen damages the muscle tissue of the heart. When this happens, part of the heart muscle dies. The injured tissue does not contract as well and weakens the heart's ability to pump blood. °· Abnormal heart valves. When the heart valves do not open and close properly, it can cause heart failure. This makes the heart muscle pump harder to keep the blood flowing. °· Heart muscle disease (cardiomyopathy or myocarditis). Heart muscle disease is damage to the heart muscle from a variety of causes. These can include drug or alcohol abuse, infections, or unknown reasons. These can increase the risk of heart failure. °· Lung disease. Lung disease  makes the heart work harder because the lungs do not work properly. This can cause a strain on the heart, leading it to fail. °· Diabetes. Diabetes increases the risk of heart failure. High blood sugar contributes to high fat (lipid) levels in the blood. Diabetes can also cause slow damage to tiny blood vessels that carry important nutrients to the heart muscle. When the heart does not get enough oxygen and food, it can cause the heart to become weak and stiff. This leads to a heart that does not contract efficiently. °· Other conditions can contribute to heart failure. These include abnormal heart rhythms, thyroid problems, and low blood counts (anemia). °Certain unhealthy behaviors can increase the risk of heart failure, including: °· Being overweight. °· Smoking or chewing tobacco. °· Eating foods high in fat and cholesterol. °· Abusing illicit drugs or alcohol. °· Lacking physical activity. °SYMPTOMS  °Heart failure symptoms may vary and can be hard to detect. Symptoms may include: °· Shortness of breath with activity, such as climbing stairs. °· Persistent cough. °· Swelling of the feet, ankles, legs, or abdomen. °· Unexplained weight gain. °· Difficulty breathing when lying flat (orthopnea). °· Waking from sleep because of the need to sit up and get more air. °· Rapid heartbeat. °· Fatigue and loss of energy. °· Feeling light-headed, dizzy, or close to fainting. °· Loss of appetite. °· Nausea. °· Increased urination during the night (nocturia). °DIAGNOSIS  °A diagnosis of heart failure is based on your history, symptoms, physical examination, and diagnostic tests. Diagnostic tests for heart failure may include: °·   Echocardiography.  Electrocardiography.  Chest X-ray.  Blood tests.  Exercise stress test.  Cardiac angiography.  Radionuclide scans. TREATMENT  Treatment is aimed at managing the symptoms of heart failure. Medicines, behavioral changes, or surgical intervention may be necessary to  treat heart failure.  Medicines to help treat heart failure may include:  Angiotensin-converting enzyme (ACE) inhibitors. This type of medicine blocks the effects of a blood protein called angiotensin-converting enzyme. ACE inhibitors relax (dilate) the blood vessels and help lower blood pressure.  Angiotensin receptor blockers (ARBs). This type of medicine blocks the actions of a blood protein called angiotensin. Angiotensin receptor blockers dilate the blood vessels and help lower blood pressure.  Water pills (diuretics). Diuretics cause the kidneys to remove salt and water from the blood. The extra fluid is removed through urination. This loss of extra fluid lowers the volume of blood the heart pumps.  Beta blockers. These prevent the heart from beating too fast and improve heart muscle strength.  Digitalis. This increases the force of the heartbeat.  Healthy behavior changes include:  Obtaining and maintaining a healthy weight.  Stopping smoking or chewing tobacco.  Eating heart-healthy foods.  Limiting or avoiding alcohol.  Stopping illicit drug use.  Physical activity as directed by your health care provider.  Surgical treatment for heart failure may include:  A procedure to open blocked arteries, repair damaged heart valves, or remove damaged heart muscle tissue.  A pacemaker to improve heart muscle function and control certain abnormal heart rhythms.  An internal cardioverter defibrillator to treat certain serious abnormal heart rhythms.  A left ventricular assist device (LVAD) to assist the pumping ability of the heart. HOME CARE INSTRUCTIONS   Take medicines only as directed by your health care provider. Medicines are important in reducing the workload of your heart, slowing the progression of heart failure, and improving your symptoms.  Do not stop taking your medicine unless directed by your health care provider.  Do not skip any dose of medicine.  Refill your  prescriptions before you run out of medicine. Your medicines are needed every day.  Engage in moderate physical activity if directed by your health care provider. Moderate physical activity can benefit some people. The elderly and people with severe heart failure should consult with a health care provider for physical activity recommendations.  Eat heart-healthy foods. Food choices should be free of trans fat and low in saturated fat, cholesterol, and salt (sodium). Healthy choices include fresh or frozen fruits and vegetables, fish, lean meats, legumes, fat-free or low-fat dairy products, and whole grain or high fiber foods. Talk to a dietitian to learn more about heart-healthy foods.  Limit sodium if directed by your health care provider. Sodium restriction may reduce symptoms of heart failure in some people. Talk to a dietitian to learn more about heart-healthy seasonings.  Use healthy cooking methods. Healthy cooking methods include roasting, grilling, broiling, baking, poaching, steaming, or stir-frying. Talk to a dietitian to learn more about healthy cooking methods.  Limit fluids if directed by your health care provider. Fluid restriction may reduce symptoms of heart failure in some people.  Weigh yourself every day. Daily weights are important in the early recognition of excess fluid. You should weigh yourself every morning after you urinate and before you eat breakfast. Wear the same amount of clothing each time you weigh yourself. Record your daily weight. Provide your health care provider with your weight record.  Monitor and record your blood pressure if directed by your health care  provider.  Check your pulse if directed by your health care provider.  Lose weight if directed by your health care provider. Weight loss may reduce symptoms of heart failure in some people.  Stop smoking or chewing tobacco. Nicotine makes your heart work harder by causing your blood vessels to constrict.  Do not use nicotine gum or patches before talking to your health care provider.  Keep all follow-up visits as directed by your health care provider. This is important.  Limit alcohol intake to no more than 1 drink per day for nonpregnant women and 2 drinks per day for men. One drink equals 12 ounces of beer, 5 ounces of wine, or 1 ounces of hard liquor. Drinking more than that is harmful to your heart. Tell your health care provider if you drink alcohol several times a week. Talk with your health care provider about whether alcohol is safe for you. If your heart has already been damaged by alcohol or you have severe heart failure, drinking alcohol should be stopped completely.  Stop illicit drug use.  Stay up-to-date with immunizations. It is especially important to prevent respiratory infections through current pneumococcal and influenza immunizations.  Manage other health conditions such as hypertension, diabetes, thyroid disease, or abnormal heart rhythms as directed by your health care provider.  Learn to manage stress.  Plan rest periods when fatigued.  Learn strategies to manage high temperatures. If the weather is extremely hot:  Avoid vigorous physical activity.  Use air conditioning or fans or seek a cooler location.  Avoid caffeine and alcohol.  Wear loose-fitting, lightweight, and light-colored clothing.  Learn strategies to manage cold temperatures. If the weather is extremely cold:  Avoid vigorous physical activity.  Layer clothes.  Wear mittens or gloves, a hat, and a scarf when going outside.  Avoid alcohol.  Obtain ongoing education and support as needed.  Participate in or seek rehabilitation as needed to maintain or improve independence and quality of life. SEEK MEDICAL CARE IF:   You have a rapid weight gain.  You have increasing shortness of breath that is unusual for you.  You are unable to participate in your usual physical activities.  You tire  easily.  You cough more than normal, especially with physical activity.  You have any or more swelling in areas such as your hands, feet, ankles, or abdomen.  You are unable to sleep because it is hard to breathe.  You feel like your heart is beating fast (palpitations).  You become dizzy or light-headed upon standing up. SEEK IMMEDIATE MEDICAL CARE IF:   You have difficulty breathing.  There is a change in mental status such as decreased alertness or difficulty with concentration.  You have a pain or discomfort in your chest.  You have an episode of fainting (syncope). MAKE SURE YOU:   Understand these instructions.  Will watch your condition.  Will get help right away if you are not doing well or get worse.   This information is not intended to replace advice given to you by your health care provider. Make sure you discuss any questions you have with your health care provider.   Document Released: 05/22/2005 Document Revised: 10/06/2014 Document Reviewed: 06/21/2012 Elsevier Interactive Patient Education 2016 [employer].  Cirrhosis Cirrhosis is long-term (chronic) liver injury. The liver is your largest internal organ, and it performs many functions. The liver converts food into energy, removes toxic material from your blood, makes important proteins, and absorbs necessary vitamins from your diet. If  you have cirrhosis, it means many of your healthy liver cells have been replaced by scar tissue. This prevents blood from flowing through your liver, which makes it difficult for your liver to function. This scarring is not reversible, but treatment can prevent it from getting worse.  CAUSES  Hepatitis C and long-term alcohol abuse are the most common causes of cirrhosis. Other causes include:  Nonalcoholic fatty liver disease.  Hepatitis B infection.  Autoimmune hepatitis.  Diseases that cause blockage of ducts inside the liver.  Inherited liver diseases.  Reactions  to certain long-term medicines.  Parasitic infections.  Long-term exposure to certain toxins. RISK FACTORS You may have a higher risk of cirrhosis if you:  Have certain hepatitis viruses.  Abuse alcohol, especially if you are male.  Are overweight.  Share needles.  Have unprotected sex with someone who has hepatitis. SYMPTOMS  You may not have any signs and symptoms at first. Symptoms may not develop until the damage to your liver starts to get worse. Signs and symptoms of cirrhosis may include:   Tenderness in the right-upper part of your abdomen.  Weakness and tiredness (fatigue).  Loss of appetite.  Nausea.  Weight loss and muscle loss.  Itchiness.  Yellow skin and eyes (jaundice).  Buildup of fluid in the abdomen (ascites).  Swelling of the feet and ankles (edema).  Appearance of tiny blood vessels under the skin.  Mental confusion.  Easy bruising and bleeding. DIAGNOSIS  Your health care provider may suspect cirrhosis based on your symptoms and medical history, especially if you have other medical conditions or a history of alcohol abuse. Your health care provider will do a physical exam to feel your liver and check for signs of cirrhosis. Your health care provider may perform other tests, including:   Blood tests to check:   Whether you have hepatitis B or C.   Kidney function.  Liver function.  Imaging tests such as:  MRI or CT scan to look for changes seen in advanced cirrhosis.  Ultrasound to see if normal liver tissue is being replaced by scar tissue.  A procedure using a long needle to take a sample of liver tissue (biopsy) for examination under a microscope. Liver biopsy can confirm the diagnosis of cirrhosis.  TREATMENT  Treatment depends on how damaged your liver is and what caused the damage. Treatment may include treating cirrhosis symptoms or treating the underlying causes of the condition to try to slow the progression of the  damage. Treatment may include:  Making lifestyle changes, such as:   Eating a healthy diet.  Restricting salt intake.  Maintaining a healthy weight.   Not abusing drugs or alcohol.  Taking medicines to:  Treat liver infections or other infections.  Control itching.  Reduce fluid buildup.  Reduce certain blood toxins.  Reduce risk of bleeding from enlarged blood vessels in the stomach or esophagus (varices).  If varices are causing bleeding problems, you may need treatment with a procedure that ties up the vessels causing them to fall off (band ligation).  If cirrhosis is causing your liver to fail, your health care provider may recommend a liver transplant.  Other treatments may be recommended depending on any complications of cirrhosis, such as liver-related kidney failure (hepatorenal syndrome). HOME CARE INSTRUCTIONS   Take medicines only as directed by your health care provider. Do not use drugs that are toxic to your liver. Ask your health care provider before taking any new medicines, including over-the-counter medicines.  Rest as needed.  Eat a well-balanced diet. Ask your health care provider or dietitian for more information.   You may have to follow a low-salt diet or restrict your water intake as directed.  Do not drink alcohol. This is especially important if you are taking acetaminophen.  Keep all follow-up visits as directed by your health care provider. This is important. SEEK MEDICAL CARE IF:  You have fatigue or weakness that is getting worse.  You develop swelling of the hands, feet, legs, or face.  You have a fever.  You develop loss of appetite.  You have nausea or vomiting.  You develop jaundice.  You develop easy bruising or bleeding. SEEK IMMEDIATE MEDICAL CARE IF:  You vomit bright red blood or a material that looks like coffee grounds.  You have blood in your stools.  Your stools appear black and tarry.  You become  confused.  You have chest pain or trouble breathing.   This information is not intended to replace advice given to you by your health care provider. Make sure you discuss any questions you have with your health care provider.   Document Released: 05/22/2005 Document Revised: 06/12/2014 Document Reviewed: 01/28/2014 Elsevier Interactive Patient Education [employer].

## 2015-07-03 NOTE — Progress Notes (Deleted)
Physician Discharge Summary  Peter Larson [id number] DOB: May 10, 1951 DOA: 06/30/2015  PCP: [name], MD  Admit date: 06/30/2015 Discharge date: 07/03/2015  Time spent: Greater than 30 minutes  Recommendations for Outpatient Follow-up:  1. Dr. Pleas Patricia, GI at [facility]: Keep prior appointment on 07/07/15 at 10:30 AM. To be seen with repeat labs (CBC & CMP). 2. Patient also has a follow-up appointment with Dr. Ronald Lobo, [facility] on 07/09/2015.  Discharge Diagnoses:  Active Problems:   OSA (obstructive sleep apnea)   Essential hypertension, benign   SOB (shortness of breath)   Acute on chronic diastolic congestive heart failure (HCC)   CKD (chronic kidney disease), stage III   Diabetes mellitus without complication (HCC)   Hypertension   Cirrhosis (HCC)   Pleural effusion on right   CHF exacerbation ([place])   Discharge Condition: Improved & Stable  Diet recommendation: Heart healthy and diabetic diet.  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 He underwent large volume paracentesis at [facility] on 1/24 with symptomatic relief. No SBP. He discussed with his [facility] gastroenterology team and was advised to come to the ED for further evaluation and management.  Hospital Course:   1. Multifactorial dyspnea: Possible acute on chronic diastolic CHF, small bilateral pleural effusions right >left, ascites, obesity, OSA and may have OHS. Received a dose of Lasix 40 mg IV 1 in the ED with some  diuresis and symptomatic relief. Discussed extensively with patient's Gastroenterologist Dr. Pleas Patricia at [facility] on 1/26 who recommended IV diuresis with Lasix as renal functions tolerate and continue Aldactone. -1.6 L since admission. Weight down by 11 pounds since admission. Clinically improved >no DOE, orthopnea resolved and leg edema decreasing. Patient states that he was taking 20 mg Lasix daily prior to admission. He was advised to continue taking Lasix 40 mg twice a day and Aldactone 50 mg twice a day, fluid restriction of 1.5 L per day, monitor daily weights and follow-up with his primary GI team on 07/07/15 with repeat labs. Diuretic dose adjustment can be made during that follow-up. He and his spouse will verbalized clear understanding. 2. Stage III chronic kidney disease: Patient's creatinine apparently fluctuates between 1.3-1.5. Currently at baseline. Monitor closely while on diuretics. Creatinine stable: 1.49 today. 3. Cirrhosis secondary to [facility] and possible hepatocellular carcinoma: Discussed with patient's primary gastroenterologist at [facility], Dr. Pleas Patricia: Stated that it has been difficult to manage his volume overload status i.e. renal insufficiency limiting diuretic use. There is suspicion for but not diagnostic of [place]. Meld score 18-19. Recommended stabilization and outpatient follow-up with them but to contact them if he were to decline. No significant ascites needing tap at this time. Patient had frequent BMs while on lactulose. He and his spouse are well aware to titrate the lactulose dose to achieve 3-4 BMs per day. 4. Essential hypertension: Reasonable inpatient control. 5. OSA: Continue nightly CPAP. 6. Type II DM: Controlled. 7. Anemia and thrombocytopenia: Likely related to problem #3. Stable. Follow CBCs as outpatient.  8. Morbid obesity/Body mass index is 45.79 kg/(m^2).   Family Communication: Discussed in detail with patient's spouse at bedside on  1/28.    Consultants:  None  Procedures:  2-D echo 07/01/15: Study Conclusions  - Left ventricle: The cavity size was normal. Systolic function was normal. The estimated ejection fraction was in the range of 55% to 60%. Regional wall motion abnormalities cannot be excluded. - Mitral valve: There was moderate regurgitation. - Left atrium: The atrium was severely dilated. - Right ventricle: The cavity size was dilated. - Right atrium: The atrium was dilated.   Discharge Exam:  Complaints: Feels much better. Anxious to go home. States that his family including grandchild are coming from [place] and he would like to be home soon. States that he ambulated the entire length of the hall yesterday without DOE. Was able to sleep flat in bed for last 2 nights without dyspnea. Leg edema slowly improving. Still having multiple BMs up to 5 times daily.  Filed Vitals:   07/02/15 1400 07/02/15 1800 07/02/15 2016 07/03/15 0415  BP: 110/39 117/48 132/56 149/54  Pulse: 63  56 56  Temp: 97.5 F (36.4 C)  97.6 F (36.4 C) 98 F (36.7 C)  TempSrc: Oral  Oral Oral  Resp: 20  16 18   Height:      Weight:    155.674 kg (343 lb 3.2 oz)  SpO2: 95%  94% 92%    General exam: Moderately built and morbidly obese male sitting up comfortably in chair this morning. Respiratory system: clear to auscultation. No increased work of breathing. Cardiovascular system: S1 & S2 heard, RRR. No JVD, murmurs, gallops, clicks. Chronic lower extremity edema (hyperpigmentation and lichenification of skin) with edema extending up to lower anterior abdominal wall and sacrum - all improving.  Gastrointestinal system: Abdomen is mildly distended, soft and nontender. Normal bowel sounds heard. May have mild ascites. Central nervous system: Alert and oriented. No focal neurological deficits. Extremities: Symmetric 5 x 5 power.  Discharge Instructions      Discharge Instructions    (HEART FAILURE PATIENTS) Call  MD:  Anytime you have any of the following symptoms: 1) 3 pound weight gain in 24 hours or 5 pounds in 1 week 2) shortness of breath, with or without a dry hacking cough 3) swelling in the hands, feet or stomach 4) if you have to sleep on extra pillows at night in order to breathe.    Complete by:  As directed      Call MD for:  difficulty breathing, headache or visual disturbances    Complete by:  As directed      Call MD for:  extreme fatigue    Complete by:  As directed      Call MD for:  persistant dizziness or light-headedness    Complete by:  As directed      Call MD for:  persistant nausea and vomiting    Complete by:  As directed      Call MD for:  severe uncontrolled pain    Complete by:  As directed      Call MD for:  temperature >100.4    Complete by:  As directed      Diet - low sodium heart healthy    Complete by:  As directed      Increase activity slowly    Complete by:  As directed             Medication List  TAKE these medications        cholecalciferol 1000 units tablet  Commonly known as:  VITAMIN D  Take 3,000 Units by mouth every morning.     esomeprazole 40 MG capsule  Commonly known as:  NEXIUM  Take 40 mg by mouth daily at 12 noon.     famotidine 20 MG tablet  Commonly known as:  PEPCID  Take 1 tablet (20 mg total) by mouth as needed for heartburn or indigestion.     furosemide 40 MG tablet  Commonly known as:  LASIX  Take 1 tablet (40 mg total) by mouth 2 (two) times daily.     lactulose 10 GM/15ML solution  Commonly known as:  CHRONULAC  Take 45 mLs (30 g total) by mouth 4 (four) times daily.     milk thistle 175 MG tablet  Take 350 mg by mouth every morning.     multivitamin tablet  Take 1 tablet by mouth every morning.     rifaximin 550 MG Tabs tablet  Commonly known as:  XIFAXAN  Take 1 tablet (550 mg total) by mouth 2 (two) times daily.     simethicone 80 MG chewable tablet  Commonly known as:  MYLICON  Chew 1 tablet (80 mg  total) by mouth 2 (two) times daily.     spironolactone 50 MG tablet  Commonly known as:  ALDACTONE  Take 1 tablet (50 mg total) by mouth 2 (two) times daily.     TYLENOL ARTHRITIS PAIN 650 MG CR tablet  Generic drug:  acetaminophen  Take 1,300 mg by mouth every morning.       Follow-up Information    Follow up with DIEHL, ANNA Karlyne Greenspan, MD On 07/07/2015.   Specialty:  Gastroenterology   Why:  Keep prior appointment at 10:30 AM. To be seen with repeat labs (CBC & CMP).   Contact information:   [place] [address] [phone]        The results of significant diagnostics from this hospitalization (including imaging, microbiology, ancillary and laboratory) are listed below for reference.    Significant Diagnostic Studies: Dg Chest 2 View  06/30/2015  CLINICAL DATA:  Shortness of breath for 2 weeks.  Initial encounter. EXAM: CHEST  2 VIEW COMPARISON:  PA and lateral chest 06/10/2015. FINDINGS: Small bilateral pleural effusions are present, greater on the right. Both effusions have increased since the prior study. There is associated basilar atelectasis. Heart size is normal. No pneumothorax is identified. IMPRESSION: Small pleural effusions, greater on the right, are increased since the comparison study. No other change. Electronically Signed   By: Inge Rise M.D.   On: 06/30/2015 20:22   Dg Chest 2 View  06/10/2015  CLINICAL DATA:  Shortness of breath and weakness with lower extremity swelling EXAM: CHEST - 2 VIEW COMPARISON:  03/04/2015 FINDINGS: Cardiac shadow is at the upper limits of normal in size. Mild central vascular congestion is again seen and stable. Superimposed bronchitic changes are noted as well. Small right-sided pleural effusion is now seen. No focal confluent infiltrate is noted. IMPRESSION: Chronic changes of mild CHF. A new small right-sided pleural effusion is now seen. Electronically Signed   By: Inez Catalina M.D.   On:  06/10/2015 09:21    Microbiology: No results found for this or any previous visit (from the past 240 hour(s)).   Labs: Basic Metabolic Panel:  Recent Labs Lab 06/30/15 1948 07/01/15 0414 07/02/15 0405 07/03/15 0304  NA  135 138 137 136  K 4.6 4.9 4.6 4.6  CL 105 104 104 105  CO2 19* 23 24 25   GLUCOSE 72 102* 93 85  BUN 22* 24* 22* 23*  CREATININE 1.49* 1.52* 1.53* 1.49*  CALCIUM 8.8* 8.9 8.7* 8.8*   Liver Function Tests:  Recent Labs Lab 06/30/15 1948 07/01/15 0414  AST 35 37  ALT 20 20  ALKPHOS 103 100  BILITOT 2.7* 3.0*  PROT 5.7* 6.2*  ALBUMIN 2.6* 2.6*   No results for input(s): LIPASE, AMYLASE in the last 168 hours. No results for input(s): AMMONIA in the last 168 hours. CBC:  Recent Labs Lab 06/30/15 1948 07/01/15 0414  WBC 7.1 6.7  NEUTROABS 3.9 4.0  HGB 12.5* 12.4*  HCT 36.5* 36.9*  MCV 93.6 94.1  PLT 105* 101*   Cardiac Enzymes:  Recent Labs Lab 06/30/15 1948 07/01/15 0414 07/01/15 0920 07/01/15 1519  TROPONINI <0.03 <0.03 <0.03 <0.03   BNP: BNP (last 3 results)  Recent Labs  06/10/15 0930 06/30/15 1948 07/01/15 0414  BNP 79.9 80.9 89.0    ProBNP (last 3 results) No results for input(s): PROBNP in the last 8760 hours.  CBG:  Recent Labs Lab 07/02/15 0643 07/02/15 1110 07/02/15 1626 07/02/15 2056 07/03/15 0617  GLUCAP 100* 109* 80 91 85       Signed:  Jariel Drost, MD, FACP, FHM. [facility] Pager [phone]  If 7PM-7AM, please contact night-coverage [email or web address] Password [facility] 07/03/2015, 10:57 AM

## 2015-07-04 NOTE — Discharge Summary (Signed)
PCP: [name], MD  Admit date: 06/30/2015 Discharge date: 07/03/2015  Time spent: Greater than 30 minutes  Recommendations for Outpatient Follow-up:  1. Dr. Pleas Patricia, GI at [facility]: Keep prior appointment on 07/07/15 at 10:30 AM. To be seen with repeat labs (CBC & CMP). 2. Patient also has a follow-up appointment with Dr. Ronald Lobo, [facility] on 07/09/2015.  Discharge Diagnoses:  Active Problems:  OSA (obstructive sleep apnea)  Essential hypertension, benign  SOB (shortness of breath)  Acute on chronic diastolic congestive heart failure (HCC)  CKD (chronic kidney disease), stage III  Diabetes mellitus without complication (HCC)  Hypertension  Cirrhosis (HCC)  Pleural effusion on right  CHF exacerbation ([employer])   Discharge Condition: Improved & Stable  Diet recommendation: Heart healthy and diabetic diet.  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 He underwent large volume paracentesis at [facility] on 1/24 with symptomatic relief. No SBP. He discussed with his [facility] gastroenterology team and was advised to come to the ED for further evaluation and management.  Hospital Course:   1. Multifactorial dyspnea: Possible acute on chronic diastolic CHF, small bilateral pleural effusions right >left, ascites, obesity, OSA and may have OHS. Received a dose of Lasix 40 mg IV 1 in the ED with some diuresis and symptomatic relief. Discussed extensively with patient's  Gastroenterologist Dr. Pleas Patricia at [facility] on 1/26 who recommended IV diuresis with Lasix as renal functions tolerate and continue Aldactone. -1.6 L since admission. Weight down by 11 pounds since admission. Clinically improved >no DOE, orthopnea resolved and leg edema decreasing. Patient states that he was taking 20 mg Lasix daily prior to admission. He was advised to continue taking Lasix 40 mg twice a day and Aldactone 50 mg twice a day, fluid restriction of 1.5 L per day, monitor daily weights and follow-up with his primary GI team on 07/07/15 with repeat labs. Diuretic dose adjustment can be made during that follow-up. He and his spouse will verbalized clear understanding. 2. Stage III chronic kidney disease: Patient's creatinine apparently fluctuates between 1.3-1.5. Currently at baseline. Monitor closely while on diuretics. Creatinine stable: 1.49 today. 3. Cirrhosis secondary to [facility] and possible hepatocellular carcinoma: Discussed with patient's primary gastroenterologist at [facility], Dr. Pleas Patricia: Stated that it has been difficult to manage his volume overload status i.e. renal insufficiency limiting diuretic use. There is suspicion for but not diagnostic of [place]. Meld score 18-19. Recommended stabilization and outpatient follow-up with them but to contact them if he were to decline. No significant ascites needing tap at this time. Patient had frequent BMs while on lactulose. He and his spouse are well aware to titrate the lactulose dose to achieve 3-4 BMs per day. 4. Essential hypertension: Reasonable inpatient control. 5. OSA: Continue nightly CPAP. 6. Type II DM: Controlled. 7. Anemia and thrombocytopenia: Likely related to problem #3. Stable. Follow CBCs as outpatient. 8. Morbid obesity/Body mass index is 45.79 kg/(m^2).   Family Communication: Discussed in detail with patient's spouse at bedside on 1/28.  Consultants:  None  Procedures:  2-D echo 07/01/15: Study Conclusions  - Left  ventricle: The cavity size was normal. Systolic function was normal. The estimated ejection fraction was in the range of 55% to 60%. Regional wall motion abnormalities cannot be excluded. - Mitral valve: There was moderate regurgitation. - Left atrium: The atrium was severely dilated. - Right ventricle: The cavity size was dilated. - Right atrium: The atrium was dilated.   Discharge Exam:  Complaints: Feels much better. Anxious to go home. States that his family including grandchild are coming from [place] and he would like to be home soon. States that he ambulated the entire length of the hall yesterday without DOE. Was able to sleep flat in bed for last 2 nights without dyspnea. Leg edema slowly improving. Still having multiple BMs up to 5 times daily.  Filed Vitals:   07/02/15 1400 07/02/15 1800 07/02/15 2016 07/03/15 0415  BP: 110/39 117/48 132/56 149/54  Pulse: 63  56 56  Temp: 97.5 F (36.4 C)  97.6 F (36.4 C) 98 F (36.7 C)  TempSrc: Oral  Oral Oral  Resp: 20  16 18   Height:      Weight:    155.674 kg (343 lb 3.2 oz)  SpO2: 95%  94% 92%    General exam: Moderately built and morbidly obese male sitting up comfortably in chair this morning. Respiratory system: clear to auscultation. No increased work of breathing. Cardiovascular system: S1 & S2 heard, RRR. No JVD, murmurs, gallops, clicks. Chronic lower extremity edema (hyperpigmentation and lichenification of skin) with edema extending up to lower anterior abdominal wall and sacrum - all improving.  Gastrointestinal system: Abdomen is mildly distended, soft and nontender. Normal bowel sounds heard. May have mild ascites. Central nervous system: Alert and oriented. No focal neurological deficits. Extremities: Symmetric 5 x 5 power.  Discharge Instructions      Discharge Instructions    (HEART FAILURE PATIENTS) Call MD: Anytime you have any of the following  symptoms: 1) 3 pound weight gain in 24 hours or 5 pounds in 1 week 2) shortness of breath, with or without a dry hacking cough 3) swelling in the hands, feet or stomach 4) if you have to sleep on extra pillows at night in order to breathe.  Complete by: As directed      Call MD for: difficulty breathing, headache or visual disturbances  Complete by: As directed      Call MD for: extreme fatigue  Complete by: As directed      Call MD for: persistant dizziness or light-headedness  Complete by: As directed      Call MD for: persistant nausea and vomiting  Complete by: As directed      Call MD for: severe uncontrolled pain  Complete by: As directed      Call MD for: temperature >100.4  Complete by: As directed      Diet - low sodium heart healthy  Complete by: As directed      Increase activity slowly  Complete by: As directed             Medication List    TAKE these medications       cholecalciferol 1000 units tablet  Commonly known as: VITAMIN D  Take 3,000 Units by mouth every morning.     esomeprazole 40 MG capsule  Commonly known as: NEXIUM  Take 40 mg by mouth daily at 12 noon.     famotidine 20 MG tablet  Commonly known as: PEPCID  Take 1 tablet (20 mg total) by mouth as needed for heartburn or indigestion.     furosemide 40 MG tablet  Commonly known as: LASIX  Take 1 tablet (40 mg total) by mouth 2 (two) times daily.     lactulose 10 GM/15ML solution  Commonly known as: CHRONULAC  Take 45 mLs (30 g total) by mouth 4 (four) times daily.     milk thistle 175 MG tablet  Take 350 mg by mouth every morning.     multivitamin tablet  Take 1 tablet by mouth every morning.     rifaximin 550 MG Tabs tablet  Commonly known as: XIFAXAN  Take 1 tablet (550 mg total) by mouth 2 (two) times daily.     simethicone 80 MG chewable tablet   Commonly known as: MYLICON  Chew 1 tablet (80 mg total) by mouth 2 (two) times daily.     spironolactone 50 MG tablet  Commonly known as: ALDACTONE  Take 1 tablet (50 mg total) by mouth 2 (two) times daily.     TYLENOL ARTHRITIS PAIN 650 MG CR tablet  Generic drug: acetaminophen  Take 1,300 mg by mouth every morning.       Follow-up Information    Follow up with DIEHL, ANNA Karlyne Greenspan, MD On 07/07/2015.   Specialty: Gastroenterology   Why: Keep prior appointment at 10:30 AM. To be seen with repeat labs (CBC & CMP).   Contact information:   [place] [address] [phone]        The results of significant diagnostics from this hospitalization (including imaging, microbiology, ancillary and laboratory) are listed below for reference.    Significant Diagnostic Studies:  Imaging Results    Dg Chest 2 View  06/30/2015 CLINICAL DATA: Shortness of breath for 2 weeks. Initial encounter. EXAM: CHEST 2 VIEW COMPARISON: PA and lateral chest 06/10/2015. FINDINGS: Small bilateral pleural effusions are present, greater on the right. Both effusions have increased since the prior study. There is associated basilar atelectasis. Heart size is normal. No pneumothorax is identified. IMPRESSION: Small pleural effusions, greater on the right, are increased since the comparison study. No other change. Electronically Signed By: Inge Rise M.D. On: 06/30/2015 20:22   Dg Chest 2 View  06/10/2015 CLINICAL DATA: Shortness of breath and weakness with lower extremity swelling EXAM: CHEST - 2 VIEW COMPARISON: 03/04/2015 FINDINGS: Cardiac shadow is at the upper limits of normal in size. Mild central vascular congestion is again seen and stable. Superimposed bronchitic changes are noted as well. Small right-sided pleural effusion is now seen. No focal confluent infiltrate is noted. IMPRESSION: Chronic changes of mild CHF. A new  small right-sided pleural effusion is now seen. Electronically Signed By: Inez Catalina M.D. On: 06/10/2015 09:21     Microbiology: No results found for this or any previous visit (from the past 240 hour(s)).   Labs: Basic Metabolic Panel:  Last Labs      Recent Labs Lab 06/30/15 1948 07/01/15 0414 07/02/15 0405 07/03/15 0304  NA 135 138 137 136  K 4.6 4.9 4.6 4.6  CL 105 104 104 105  CO2 19* 23 24 25   GLUCOSE 72 102* 93 85  BUN 22* 24* 22* 23*  CREATININE 1.49* 1.52* 1.53* 1.49*  CALCIUM 8.8* 8.9 8.7* 8.8*     Liver Function Tests:  Last Labs      Recent Labs Lab 06/30/15 1948 07/01/15 0414  AST 35 37  ALT 20  20  ALKPHOS 103 100  BILITOT 2.7* 3.0*  PROT 5.7* 6.2*  ALBUMIN 2.6* 2.6*      Last Labs     No results for input(s): LIPASE, AMYLASE in the last 168 hours.    Last Labs     No results for input(s): AMMONIA in the last 168 hours.   CBC:  Last Labs      Recent Labs Lab 06/30/15 1948 07/01/15 0414  WBC 7.1 6.7  NEUTROABS 3.9 4.0  HGB 12.5* 12.4*  HCT 36.5* 36.9*  MCV 93.6 94.1  PLT 105* 101*     Cardiac Enzymes:  Last Labs      Recent Labs Lab 06/30/15 1948 07/01/15 0414 07/01/15 0920 07/01/15 1519  TROPONINI <0.03 <0.03 <0.03 <0.03     BNP: BNP (last 3 results)  Recent Labs (within last 365 days)     Recent Labs  06/10/15 0930 06/30/15 1948 07/01/15 0414  BNP 79.9 80.9 89.0      ProBNP (last 3 results)  Recent Labs (within last 365 days)    No results for input(s): PROBNP in the last 8760 hours.    CBG:  Last Labs      Recent Labs Lab 07/02/15 0643 07/02/15 1110 07/02/15 1626 07/02/15 2056 07/03/15 0617  GLUCAP 100* 109* 80 91 85         Signed:  Audreana Hancox, MD, FACP, FHM. [facility] Pager [phone]  If 7PM-7AM, please contact  night-coverage [email or web address] Password [facility] 07/03/2015, 10:57 AM

## 2015-08-04 DIAGNOSIS — J90 Pleural effusion, not elsewhere classified: Secondary | ICD-10-CM

## 2015-08-04 HISTORY — DX: Pleural effusion, not elsewhere classified: J90

## 2015-08-30 ENCOUNTER — Encounter (HOSPITAL_COMMUNITY): Payer: Self-pay | Admitting: Emergency Medicine

## 2015-08-30 DIAGNOSIS — I77819 Aortic ectasia, unspecified site: Secondary | ICD-10-CM | POA: Insufficient documentation

## 2015-08-30 DIAGNOSIS — G4733 Obstructive sleep apnea (adult) (pediatric): Secondary | ICD-10-CM | POA: Diagnosis not present

## 2015-08-30 DIAGNOSIS — I5033 Acute on chronic diastolic (congestive) heart failure: Secondary | ICD-10-CM | POA: Insufficient documentation

## 2015-08-30 DIAGNOSIS — M069 Rheumatoid arthritis, unspecified: Secondary | ICD-10-CM | POA: Diagnosis not present

## 2015-08-30 DIAGNOSIS — I340 Nonrheumatic mitral (valve) insufficiency: Secondary | ICD-10-CM | POA: Insufficient documentation

## 2015-08-30 DIAGNOSIS — Z6841 Body Mass Index (BMI) 40.0 and over, adult: Secondary | ICD-10-CM | POA: Diagnosis not present

## 2015-08-30 DIAGNOSIS — D638 Anemia in other chronic diseases classified elsewhere: Secondary | ICD-10-CM | POA: Insufficient documentation

## 2015-08-30 DIAGNOSIS — Z7682 Awaiting organ transplant status: Secondary | ICD-10-CM | POA: Insufficient documentation

## 2015-08-30 DIAGNOSIS — I130 Hypertensive heart and chronic kidney disease with heart failure and stage 1 through stage 4 chronic kidney disease, or unspecified chronic kidney disease: Secondary | ICD-10-CM | POA: Diagnosis not present

## 2015-08-30 DIAGNOSIS — K7581 Nonalcoholic steatohepatitis (NASH): Secondary | ICD-10-CM | POA: Diagnosis not present

## 2015-08-30 DIAGNOSIS — J90 Pleural effusion, not elsewhere classified: Secondary | ICD-10-CM | POA: Diagnosis not present

## 2015-08-30 DIAGNOSIS — E1122 Type 2 diabetes mellitus with diabetic chronic kidney disease: Secondary | ICD-10-CM | POA: Diagnosis not present

## 2015-08-30 DIAGNOSIS — I493 Ventricular premature depolarization: Secondary | ICD-10-CM | POA: Insufficient documentation

## 2015-08-30 DIAGNOSIS — N183 Chronic kidney disease, stage 3 (moderate): Secondary | ICD-10-CM | POA: Diagnosis not present

## 2015-08-30 DIAGNOSIS — K7200 Acute and subacute hepatic failure without coma: Secondary | ICD-10-CM | POA: Insufficient documentation

## 2015-08-30 DIAGNOSIS — Z9119 Patient's noncompliance with other medical treatment and regimen: Secondary | ICD-10-CM | POA: Diagnosis not present

## 2015-08-30 DIAGNOSIS — I272 Other secondary pulmonary hypertension: Secondary | ICD-10-CM | POA: Diagnosis not present

## 2015-08-30 DIAGNOSIS — K7460 Unspecified cirrhosis of liver: Secondary | ICD-10-CM | POA: Insufficient documentation

## 2015-08-30 DIAGNOSIS — R0600 Dyspnea, unspecified: Secondary | ICD-10-CM | POA: Insufficient documentation

## 2015-08-30 DIAGNOSIS — G9340 Encephalopathy, unspecified: Secondary | ICD-10-CM | POA: Diagnosis not present

## 2015-08-30 DIAGNOSIS — K7290 Hepatic failure, unspecified without coma: Secondary | ICD-10-CM | POA: Diagnosis present

## 2015-08-31 ENCOUNTER — Encounter (HOSPITAL_COMMUNITY): Payer: Self-pay | Admitting: General Practice

## 2015-08-31 ENCOUNTER — Observation Stay (HOSPITAL_COMMUNITY)
Admission: EM | Admit: 2015-08-31 | Discharge: 2015-09-01 | Disposition: A | Discharge status: Home / Self Care | Payer: 59 | Attending: Family Medicine | Admitting: Family Medicine

## 2015-08-31 ENCOUNTER — Emergency Department (HOSPITAL_COMMUNITY): Payer: 59

## 2015-08-31 DIAGNOSIS — K7460 Unspecified cirrhosis of liver: Secondary | ICD-10-CM | POA: Diagnosis present

## 2015-08-31 DIAGNOSIS — I5033 Acute on chronic diastolic (congestive) heart failure: Secondary | ICD-10-CM | POA: Diagnosis present

## 2015-08-31 DIAGNOSIS — R0603 Acute respiratory distress: Secondary | ICD-10-CM | POA: Diagnosis present

## 2015-08-31 DIAGNOSIS — E669 Obesity, unspecified: Secondary | ICD-10-CM | POA: Diagnosis present

## 2015-08-31 DIAGNOSIS — K7200 Acute and subacute hepatic failure without coma: Secondary | ICD-10-CM | POA: Diagnosis present

## 2015-08-31 DIAGNOSIS — K7290 Hepatic failure, unspecified without coma: Secondary | ICD-10-CM

## 2015-08-31 DIAGNOSIS — I340 Nonrheumatic mitral (valve) insufficiency: Secondary | ICD-10-CM | POA: Diagnosis present

## 2015-08-31 DIAGNOSIS — R0600 Dyspnea, unspecified: Secondary | ICD-10-CM

## 2015-08-31 DIAGNOSIS — I10 Essential (primary) hypertension: Secondary | ICD-10-CM | POA: Diagnosis present

## 2015-08-31 DIAGNOSIS — J90 Pleural effusion, not elsewhere classified: Secondary | ICD-10-CM | POA: Diagnosis present

## 2015-08-31 DIAGNOSIS — N183 Chronic kidney disease, stage 3 unspecified: Secondary | ICD-10-CM | POA: Diagnosis present

## 2015-08-31 DIAGNOSIS — K7581 Nonalcoholic steatohepatitis (NASH): Secondary | ICD-10-CM

## 2015-08-31 DIAGNOSIS — I5032 Chronic diastolic (congestive) heart failure: Secondary | ICD-10-CM | POA: Diagnosis present

## 2015-08-31 DIAGNOSIS — G4733 Obstructive sleep apnea (adult) (pediatric): Secondary | ICD-10-CM | POA: Diagnosis present

## 2015-08-31 DIAGNOSIS — K7682 Hepatic encephalopathy: Secondary | ICD-10-CM

## 2015-08-31 HISTORY — DX: Chronic kidney disease, unspecified: N18.9

## 2015-08-31 HISTORY — DX: Pleural effusion, not elsewhere classified: J90

## 2015-08-31 LAB — COMPREHENSIVE METABOLIC PANEL
ALK PHOS: 103 U/L (ref 38–126)
ALT: 23 U/L (ref 17–63)
ANION GAP: 8 (ref 5–15)
AST: 44 U/L — ABNORMAL HIGH (ref 15–41)
Albumin: 2.6 g/dL — ABNORMAL LOW (ref 3.5–5.0)
BILIRUBIN TOTAL: 1.6 mg/dL — AB (ref 0.3–1.2)
BUN: 26 mg/dL — ABNORMAL HIGH (ref 6–20)
CALCIUM: 8.7 mg/dL — AB (ref 8.9–10.3)
CO2: 19 mmol/L — ABNORMAL LOW (ref 22–32)
Chloride: 108 mmol/L (ref 101–111)
Creatinine, Ser: 1.59 mg/dL — ABNORMAL HIGH (ref 0.61–1.24)
GFR, EST AFRICAN AMERICAN: 51 mL/min — AB (ref >60)
GFR, EST NON AFRICAN AMERICAN: 44 mL/min — AB (ref >60)
GLUCOSE: 139 mg/dL — AB (ref 65–99)
Potassium: 4.8 mmol/L (ref 3.5–5.1)
Sodium: 135 mmol/L (ref 135–145)
TOTAL PROTEIN: 5.8 g/dL — AB (ref 6.5–8.1)

## 2015-08-31 LAB — CBC WITH DIFFERENTIAL/PLATELET
BASOS ABS: 0 10*3/uL (ref 0.0–0.1)
Basophils Relative: 0 %
EOS ABS: 0.2 10*3/uL (ref 0.0–0.7)
Eosinophils Relative: 4 %
HEMATOCRIT: 33.7 % — AB (ref 39.0–52.0)
Hemoglobin: 11 g/dL — ABNORMAL LOW (ref 13.0–17.0)
LYMPHS ABS: 0.8 10*3/uL (ref 0.7–4.0)
LYMPHS PCT: 18 %
MCH: 30.3 pg (ref 26.0–34.0)
MCHC: 32.6 g/dL (ref 30.0–36.0)
MCV: 92.8 fL (ref 78.0–100.0)
MONOS PCT: 24 %
Monocytes Absolute: 1.1 10*3/uL — ABNORMAL HIGH (ref 0.1–1.0)
NEUTROS ABS: 2.4 10*3/uL (ref 1.7–7.7)
Neutrophils Relative %: 54 %
Platelets: 69 10*3/uL — ABNORMAL LOW (ref 150–400)
RBC: 3.63 MIL/uL — AB (ref 4.22–5.81)
RDW: 14.5 % (ref 11.5–15.5)
Smear Review: DECREASED
WBC: 4.5 10*3/uL (ref 4.0–10.5)

## 2015-08-31 LAB — MAGNESIUM: Magnesium: 1.9 mg/dL (ref 1.7–2.4)

## 2015-08-31 LAB — GLUCOSE, CAPILLARY
Glucose-Capillary: 105 mg/dL — ABNORMAL HIGH (ref 65–99)
Glucose-Capillary: 78 mg/dL (ref 65–99)
Glucose-Capillary: 104 mg/dL — ABNORMAL HIGH (ref 65–99)

## 2015-08-31 LAB — AMMONIA: AMMONIA: 172 umol/L — AB (ref 9–35)

## 2015-08-31 MED ORDER — HYDROXYZINE HCL 25 MG PO TABS
25.0000 mg | ORAL_TABLET | Freq: Three times a day (TID) | ORAL | Status: DC
  Administered 2015-08-31 – 2015-09-01 (×5): 25 mg via ORAL
  Filled 2015-08-31 (×5): qty 1

## 2015-08-31 MED ORDER — FUROSEMIDE 10 MG/ML IJ SOLN
40.0000 mg | Freq: Two times a day (BID) | INTRAMUSCULAR | Status: DC
  Administered 2015-08-31 – 2015-09-01 (×2): 40 mg via INTRAVENOUS
  Filled 2015-08-31 (×2): qty 4

## 2015-08-31 MED ORDER — ALBUTEROL SULFATE (2.5 MG/3ML) 0.083% IN NEBU
5.0000 mg | INHALATION_SOLUTION | Freq: Once | RESPIRATORY_TRACT | Status: AC
  Administered 2015-08-31: 5 mg via RESPIRATORY_TRACT

## 2015-08-31 MED ORDER — RIFAXIMIN 550 MG PO TABS
550.0000 mg | ORAL_TABLET | Freq: Two times a day (BID) | ORAL | Status: DC
  Administered 2015-08-31 – 2015-09-01 (×3): 550 mg via ORAL
  Filled 2015-08-31 (×6): qty 1

## 2015-08-31 MED ORDER — SODIUM CHLORIDE 0.9 % IV SOLN: 250.0000 mL | INTRAVENOUS | Status: DC | PRN

## 2015-08-31 MED ORDER — ACETAMINOPHEN 325 MG PO TABS
650.0000 mg | ORAL_TABLET | ORAL | Status: DC | PRN
  Administered 2015-08-31: 650 mg via ORAL
  Filled 2015-08-31: qty 2

## 2015-08-31 MED ORDER — SPIRONOLACTONE 25 MG PO TABS
50.0000 mg | ORAL_TABLET | Freq: Two times a day (BID) | ORAL | Status: DC
  Administered 2015-08-31 – 2015-09-01 (×3): 50 mg via ORAL
  Filled 2015-08-31: qty 2
  Filled 2015-08-31: qty 1
  Filled 2015-08-31: qty 2

## 2015-08-31 MED ORDER — ADULT MULTIVITAMIN W/MINERALS CH
1.0000 | ORAL_TABLET | Freq: Every morning | ORAL | Status: DC
  Administered 2015-08-31 – 2015-09-01 (×2): 1 via ORAL
  Filled 2015-08-31 (×2): qty 1

## 2015-08-31 MED ORDER — ENOXAPARIN SODIUM 80 MG/0.8ML ~~LOC~~ SOLN: 0.5000 mg/kg | SUBCUTANEOUS | Status: DC

## 2015-08-31 MED ORDER — SODIUM CHLORIDE 0.9% FLUSH
3.0000 mL | Freq: Two times a day (BID) | INTRAVENOUS | Status: DC
  Administered 2015-08-31 – 2015-09-01 (×3): 3 mL via INTRAVENOUS

## 2015-08-31 MED ORDER — ALBUTEROL SULFATE (2.5 MG/3ML) 0.083% IN NEBU
INHALATION_SOLUTION | RESPIRATORY_TRACT | Status: AC
  Filled 2015-08-31: qty 6

## 2015-08-31 MED ORDER — ONDANSETRON HCL 4 MG/2ML IJ SOLN: 4.0000 mg | Freq: Four times a day (QID) | INTRAMUSCULAR | Status: DC | PRN

## 2015-08-31 MED ORDER — LACTULOSE 10 GM/15ML PO SOLN
30.0000 g | Freq: Once | ORAL | Status: AC
  Administered 2015-08-31: 30 g via ORAL
  Filled 2015-08-31: qty 45

## 2015-08-31 MED ORDER — FUROSEMIDE 10 MG/ML IJ SOLN
40.0000 mg | Freq: Once | INTRAMUSCULAR | Status: AC
  Administered 2015-08-31: 40 mg via INTRAVENOUS
  Filled 2015-08-31: qty 4

## 2015-08-31 MED ORDER — PANTOPRAZOLE SODIUM 40 MG PO TBEC
40.0000 mg | DELAYED_RELEASE_TABLET | Freq: Every day | ORAL | Status: DC
  Administered 2015-08-31 – 2015-09-01 (×2): 40 mg via ORAL
  Filled 2015-08-31 (×2): qty 1

## 2015-08-31 MED ORDER — INSULIN ASPART 100 UNIT/ML ~~LOC~~ SOLN
0.0000 [IU] | SUBCUTANEOUS | Status: DC
Start: 2015-08-31 — End: 2015-09-01

## 2015-08-31 MED ORDER — ZINC SULFATE 220 (50 ZN) MG PO CAPS
220.0000 mg | ORAL_CAPSULE | Freq: Every day | ORAL | Status: DC
  Administered 2015-08-31 – 2015-09-01 (×2): 220 mg via ORAL
  Filled 2015-08-31 (×3): qty 1

## 2015-08-31 MED ORDER — LACTULOSE 10 GM/15ML PO SOLN
30.0000 g | Freq: Four times a day (QID) | ORAL | Status: DC
  Administered 2015-08-31 – 2015-09-01 (×4): 30 g via ORAL
  Filled 2015-08-31 (×5): qty 45

## 2015-08-31 MED ORDER — SODIUM CHLORIDE 0.9% FLUSH: 3.0000 mL | INTRAVENOUS | Status: DC | PRN

## 2015-08-31 NOTE — ED Notes (Signed)
Pt states that he normally sleeps on one pillow and with a CPAP. Pt states that he feels like his breathing is worse at night. Family is worried about pts ammonia level.

## 2015-08-31 NOTE — Plan of Care (Signed)
65 year old male with known history of cirrhosis of liver present to the ER because of increasing confusion and shortness of breath. Chest x-ray shows moderate right-sided pleural effusion and labs revealed elevated ammonia level at 172. Patient is being admitted for hepatic encephalopathy and pleural effusion. ER physician has ordered lactulose and Lasix.  Gean Birchwood.

## 2015-08-31 NOTE — ED Notes (Signed)
Pt. reports SOB with dry cough onset last week , denies fever or chills , no chest pain / emesis or diaphoresis .

## 2015-08-31 NOTE — ED Provider Notes (Signed)
CSN: [id number]     Arrival date & time 08/30/15  2348 History   First MD Initiated Contact with Patient 08/31/15 [phone]     Chief Complaint  Patient presents with  . Shortness of Breath     (Consider location/radiation/quality/duration/timing/severity/associated sxs/prior Treatment) HPI   Patient is a 65 year old male with history of fatty liver disease with hepatic encephalopathy, diabetes, CHF, obstructive sleep apnea, morbid obesity presenting today with increasing confusion at home as well as increasing shortness of breath. Patient is followed at [facility] for his primary liver disease. He's been evaluated for a transplant. Patient is on lactulose 30 mg 3 times daily. Patient is becoming increasingly confused at home and so his wife increased his lactulose but he's continued to be confused.  In addition they have been monitoring patient's fluid on bilateral lungs. Wife reports that he has become increasingly shortness of breath over the last 2 days.  Past Medical History  Diagnosis Date  . Sleep apnea   . Splenomegaly     slt thrombocytopenia;no complics. egd Q000111Q neg for varices, ?mild portal gastropathy; 02/2011 nl,novarices  . Diabetes mellitus without complication ([place])     type two  . Hypertension   . Adenomatous colon polyp '01 & '08  . Hemorrhoids     Severe anorectal pain and anal fissure w bleeding following hemorrhoidectomy may 2010  . Family history of colon cancer     mother --73  . Seasonal allergies   . Screening     Hepatocellular Screening CT neg 05/2008, U/S neg 04/2009,05/2010  . Dysphagia     BaS/tab neg 8/09; egd's neg for ring/strict--?dysmotility  . Chronic diastolic congestive heart failure ([place])     a.  ECHO 01/13/14: EF 55-60%; moderate LVH, G2DD, biatrial enlargement; mild RVE; moderate MR; mildly elevated, pulm pressures.  . OSA (obstructive sleep apnea)   . Morbid obesity ([place])   . Frequent PVCs   . Pancytopenia ([place])   . 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  Marland Kitchen Shortness of breath   . Arthritis     RA  " ALL OVER "  . Dilatation of aorta (H[facility])     a. Mild aortic root dilitation by echo 01/13/14  . Chronic hepatitis ([place])     cirrhosis  . Hepatic encephalopathy [facility])    Past Surgical History  Procedure Laterality Date  . Fissure surgery    . Hemorrhoid surgery    . Esophagogastroduodenoscopy (egd) with propofol N/A 03/11/2015    Procedure: ESOPHAGOGASTRODUODENOSCOPY (EGD) WITH PROPOFOL;  Surgeon: Ronald Lobo, MD;  Location: [facility] ENDOSCOPY;  Service: Endoscopy;  Laterality: N/A;  . Colonoscopy with propofol N/A 03/11/2015    Procedure: COLONOSCOPY WITH PROPOFOL;  Surgeon: Ronald Lobo, MD;  Location: [facility];  Service: Endoscopy;  Laterality: N/A;   Family History  Problem Relation Age of Onset  . Cancer Mother     died of breast cancer  . Liver disease Father   . Thyroid disease Sister    Social History  Substance Use Topics  . Smoking status: Never Smoker   . Smokeless tobacco: Never Used  . Alcohol Use: No     Comment: quit 2008    Review of Systems  Constitutional: Positive for fatigue.  Respiratory: Positive for cough and shortness of breath.   Gastrointestinal: Negative for abdominal pain.  Psychiatric/Behavioral: Positive for behavioral problems and confusion.      Allergies  Review of patient's allergies indicates no  known allergies.  Home Medications   Prior to Admission medications   Medication Sig Start Date End Date Taking? Authorizing Provider  cholecalciferol (VITAMIN D) 1000 UNITS tablet Take 3,000 Units by mouth every morning.    Yes Historical Provider, MD  esomeprazole (NEXIUM) 40 MG capsule Take 40 mg by mouth daily at 12 noon.   Yes Historical Provider, MD  furosemide (LASIX) 40 MG tablet Take 1 tablet (40 mg total) by mouth 2 (two) times daily. 02/01/14  Yes Dayna N Dunn, PA-C  hydrOXYzine (ATARAX/VISTARIL) 25 MG tablet Take 25 mg by mouth every 8  (eight) hours. 08/26/15  Yes Historical Provider, MD  lactulose (CHRONULAC) 10 GM/15ML solution Take 45 mLs (30 g total) by mouth 4 (four) times daily. 02/24/14  Yes Kinnie Feil, MD  milk thistle 175 MG tablet Take 350 mg by mouth every morning.    Yes Historical Provider, MD  Multiple Vitamin (MULTIVITAMIN) tablet Take 1 tablet by mouth every morning.    Yes Historical Provider, MD  rifaximin (XIFAXAN) 550 MG TABS tablet Take 1 tablet (550 mg total) by mouth 2 (two) times daily. 02/13/14  Yes Geradine Girt, DO  spironolactone (ALDACTONE) 50 MG tablet Take 1 tablet (50 mg total) by mouth 2 (two) times daily. 02/01/14  Yes Dayna N Dunn, PA-C  zinc sulfate 220 MG capsule Take 220 mg by mouth daily. 08/17/15 08/16/16 Yes Historical Provider, MD  famotidine (PEPCID) 20 MG tablet Take 1 tablet (20 mg total) by mouth as needed for heartburn or indigestion. Patient not taking: Reported on 08/31/2015 02/24/14   Kinnie Feil, MD  simethicone (MYLICON) 80 MG chewable tablet Chew 1 tablet (80 mg total) by mouth 2 (two) times daily. Patient not taking: Reported on 08/31/2015 07/03/15   Modena Jansky, MD   BP 146/84 mmHg  Pulse 45  Temp(Src) 98.5 F (36.9 C) (Oral)  Resp 16  Ht 6\' 1"  (1.854 m)  Wt 333 lb (151.048 kg)  BMI 43.94 kg/m2  SpO2 94% Physical Exam  Constitutional: He appears well-nourished.  HENT:  Head: Normocephalic.  Mouth/Throat: Oropharynx is clear and moist.  Eyes: Conjunctivae are normal.  Neck: No tracheal deviation present.  Cardiovascular: Normal rate.   Pulmonary/Chest: Effort normal. No stridor. He has no wheezes. He has rales.  Mild tachypnea. Mild crackles.  Abdominal: Soft. There is no tenderness. There is no guarding.  Musculoskeletal: Normal range of motion. He exhibits no edema.  Neurological:  Patient very confused, somnolent.  Skin: Skin is warm and dry. No rash noted. He is not diaphoretic.  Nursing note and vitals reviewed.   ED Course  Procedures  (including critical care time) Labs Review Labs Reviewed  CBC WITH DIFFERENTIAL/PLATELET - Abnormal; Notable for the following:    RBC 3.63 (*)    Hemoglobin 11.0 (*)    HCT 33.7 (*)    Platelets 69 (*)    Monocytes Absolute 1.1 (*)    All other components within normal limits  COMPREHENSIVE METABOLIC PANEL - Abnormal; Notable for the following:    CO2 19 (*)    Glucose, Bld 139 (*)    BUN 26 (*)    Creatinine, Ser 1.59 (*)    Calcium 8.7 (*)    Total Protein 5.8 (*)    Albumin 2.6 (*)    AST 44 (*)    Total Bilirubin 1.6 (*)    GFR calc non Af Amer 44 (*)    GFR calc Af Amer 51 (*)  All other components within normal limits  AMMONIA - Abnormal; Notable for the following:    Ammonia 172 (*)    All other components within normal limits    Imaging Review Dg Chest 2 View  08/31/2015  CLINICAL DATA:  Acute onset of shortness of breath and generalized weakness. Initial encounter. EXAM: CHEST  2 VIEW COMPARISON:  Chest radiograph performed 06/30/2015 FINDINGS: An increasing small to moderate right-sided pleural effusion is noted. Underlying vascular congestion is seen. Bibasilar airspace opacities may reflect mild pulmonary edema. No pneumothorax is seen. The heart is borderline normal in size. No acute osseous abnormalities identified. IMPRESSION: Increasing small to moderate right-sided pleural effusion noted. Vascular congestion seen. Bibasilar airspace opacities may reflect mild pulmonary edema. Electronically Signed   By: Garald Balding M.D.   On: 08/31/2015 01:05   I have personally reviewed and evaluated these images and lab results as part of my medical decision-making.   EKG Interpretation   Date/Time:  Monday August 30 2015 23:56:50 EDT Ventricular Rate:  101 PR Interval:  208 QRS Duration: 86 QT Interval:  366 QTC Calculation: 474 R Axis:   25 Text Interpretation:  Sinus tachycardia with frequent Premature  ventricular complexes Low voltage QRS Cannot rule out  Anteroseptal infarct  , age undetermined Abnormal ECG No significant change since last tracing  Confirmed by Gerald Leitz ([id number]) on 08/31/2015 6:24:27 AM      MDM   Final diagnoses:  None    Patient is a 65 year old male with COPD, diabetes, CHF, morbid obesity, fatty liver disease with hepatic encephalopathy. Patient's presenting today with increasing confusion and shortness of breath. Ammonia levels are very elevated. Patient's wife is trying to increase lactulose at home but patient's ammonia remains elevated. In addition patient then monitored for increasing pulmonary edema. Patient increasingly short of breath today. Patient satting around 92% on room air with very mild crackles bilaterally.   Patient will likely require inpatient admission for hepatic encephalopathy and worsening CHF.   Arnisha Laffoon Julio Alm, MD 08/31/15 [phone]

## 2015-08-31 NOTE — H&P (Signed)
[facility] History and Physical                                                                                    Peter Larson, is a 65 y.o. male  MRN: [id number]   DOB - [birth year]  Admit Date - 08/31/2015  Outpatient Primary MD for the patient is [name], MD  Referring MD: Thomasene Lot / ER  PMH: Past Medical History  Diagnosis Date  . Sleep apnea   . Splenomegaly     slt thrombocytopenia;no complics. egd Q000111Q neg for varices, ?mild portal gastropathy; 02/2011 nl,novarices  . Diabetes mellitus without complication ([place])     type two  . Hypertension   . Adenomatous colon polyp '01 & '08  . Hemorrhoids     Severe anorectal pain and anal fissure w bleeding following hemorrhoidectomy may 2010  . Family history of colon cancer     mother --34  . Seasonal allergies   . Screening     Hepatocellular Screening CT neg 05/2008, U/S neg 04/2009,05/2010  . Dysphagia     BaS/tab neg 8/09; egd's neg for ring/strict--?dysmotility  . Chronic diastolic congestive heart failure ([place])     a.  ECHO 01/13/14: EF 55-60%; moderate LVH, G2DD, biatrial enlargement; mild RVE; moderate MR; mildly elevated, pulm pressures.  . OSA (obstructive sleep apnea)   . Morbid obesity ([place])   . Frequent PVCs   . Pancytopenia ([place])   . 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  Marland Kitchen Shortness of breath   . Arthritis     RA  " ALL OVER "  . Dilatation of aorta (H[facility])     a. Mild aortic root dilitation by echo 01/13/14  . Chronic hepatitis ([place])     cirrhosis  . Hepatic encephalopathy (H[facility])       PSH: Past Surgical History  Procedure Laterality Date  . Fissure surgery    . Hemorrhoid surgery    . Esophagogastroduodenoscopy (egd) with propofol N/A 03/11/2015    Procedure: ESOPHAGOGASTRODUODENOSCOPY (EGD) WITH PROPOFOL;  Surgeon: Ronald Lobo, MD;  [facility];  Service: Endoscopy;  Laterality: N/A;  . Colonoscopy with propofol N/A 03/11/2015    Procedure:  COLONOSCOPY WITH PROPOFOL;  Surgeon: Ronald Lobo, MD;  Location[facility];  Service: Endoscopy;  Laterality: N/A;     CC:  Chief Complaint  Patient presents with  . Shortness of Breath     HPI: 65 year old male patient with hypertension, sleep apnea on CPAP, chronic diastolic heart failure in the setting of moderate mitral regurgitation, stage III chronic kidney disease, Karlene Lineman cirrhosis and morbid obesity who presents with symptoms of progressive confusion and shortness of breath. Because of patient's encephalopathy although he is alert he is confused and a poor historian so history was obtained from the patient's wife. Wife reports that it is a constant battle at home to manage fluid volume status in setting of heart failure and cirrhosis. His weight is always fluctuating and constant adjustments are being made and the patient asked Lasix and spironolactone secondary to his renal function status at the time. The wife noticed yesterday patient  with increased confusion symptoms prompting her to increase lactulose dosing from a 3 times a day to 4 times a day schedule. During the night patient developed orthopnea and PND despite utilization of his usual CPAP. 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 Because of the symptoms wife brought patient to the ER for further evaluation.  ER Evaluation and treatment: Temperature 99.3-BP 106/87-respirations 30-room air saturations 94%-weight 333 pounds-height 6 foot 1 inch-BMI 44 Repeat vital signs after treatment temperature 98.5-BP 146/82-pulse 77-respirations 18-room air saturations 94% 2 view CXR: Increasing moderate right-sided pleural effusion with vascular congestion consistent with mild pulmonary edema EKG: Sinus tachycardia with unifocal PVCs, ventricular rate 101 bpm, QTC 474 ms, EKG unchanged from previous and no definitive ischemic changes observed Lab data: Na 135, K 4.8, CO2 19, BUN 26, creatinine 1.59,  glucose 139, albumin 2.6, AST 44, ammonia 172, total bilirubin 1.6, WBC 4500 with normal differential, hemoglobin 11, platelets 69,000 Proventil neb 1 Lasix 40 mg IV 1 Chronulac 30 g by mouth 1  Review of Systems   In addition to the HPI above,  No Fever-chills, myalgias or other constitutional symptoms No Headache, changes with Vision or hearing, new weakness, tingling, numbness in any extremity, No problems swallowing food or Liquids, indigestion/reflux No Chest pain, Cough, palpitations, DOE No Abdominal pain, N/V; no melena or hematochezia, no dark tarry stools No dysuria, hematuria or flank pain No new skin rashes, lesions, masses or bruises, No new joints pains-aches No recent weight gain or loss No polyuria, polydypsia or polyphagia,  *A full 10 point Review of Systems was done, except as stated above, all other Review of Systems were negative.  Social History Social History  Substance Use Topics  . Smoking status: Never Smoker   . Smokeless tobacco: Never Used  . Alcohol Use: No     Comment: quit 2008    Resides at: Private residence  Lives with: Spouse  Ambulatory status: Without assistive devices-still works and runs his own business   Family History Family History  Problem Relation Age of Onset  . Cancer Mother     died of breast cancer  . Liver disease Father   . Thyroid disease Sister      Prior to Admission medications   Medication Sig Start Date End Date Taking? Authorizing Provider  cholecalciferol (VITAMIN D) 1000 UNITS tablet Take 3,000 Units by mouth every morning.    Yes Historical Provider, MD  esomeprazole (NEXIUM) 40 MG capsule Take 40 mg by mouth daily at 12 noon.   Yes Historical Provider, MD  furosemide (LASIX) 40 MG tablet Take 1 tablet (40 mg total) by mouth 2 (two) times daily. 02/01/14  Yes Dayna N Dunn, PA-C  hydrOXYzine (ATARAX/VISTARIL) 25 MG tablet Take 25 mg by mouth every 8 (eight) hours. 08/26/15  Yes Historical Provider, MD    lactulose (CHRONULAC) 10 GM/15ML solution Take 45 mLs (30 g total) by mouth 4 (four) times daily. 02/24/14  Yes Kinnie Feil, MD  milk thistle 175 MG tablet Take 350 mg by mouth every morning.    Yes Historical Provider, MD  Multiple Vitamin (MULTIVITAMIN) tablet Take 1 tablet by mouth every morning.    Yes Historical Provider, MD  rifaximin (XIFAXAN) 550 MG TABS tablet Take 1 tablet (550 mg total) by mouth 2 (two) times daily. 02/13/14  Yes Geradine Girt, DO  spironolactone (ALDACTONE) 50 MG tablet Take 1 tablet (50 mg total) by mouth 2 (two) times daily. 02/01/14  Yes Dayna N Dunn, PA-C  zinc sulfate 220 MG capsule Take 220 mg by mouth daily. 08/17/15 08/16/16 Yes Historical Provider, MD  famotidine (PEPCID) 20 MG tablet Take 1 tablet (20 mg total) by mouth as needed for heartburn or indigestion. Patient not taking: Reported on 08/31/2015 02/24/14   Kinnie Feil, MD  simethicone (MYLICON) 80 MG chewable tablet Chew 1 tablet (80 mg total) by mouth 2 (two) times daily. Patient not taking: Reported on 08/31/2015 07/03/15   Modena Jansky, MD    No Known Allergies  Physical Exam  Vitals  Blood pressure 146/82, pulse 77, temperature 98.5 F (36.9 C), temperature source Oral, resp. rate 18, height 6\' 1"  (1.854 m), weight 333 lb (151.048 kg), SpO2 94 %.   General:  In no acute distress, appears healthy and well nourished  Psych:  Normal affect, Denies Suicidal or Homicidal ideations, Awake Alert, Oriented X Name and place. Does not appear to have any short-term memory deficits but in setting of hepatic encephalopathy does have some difficulty processing and recalling recent events  Neuro:   No focal neurological deficits, CN II through XII intact, Strength 5/5 all 4 extremities, Sensation intact all 4 extremities.  ENT:  Ears and Eyes appear Normal, Conjunctivae clear, PER. Moist oral mucosa without erythema or exudates.  Neck:  Supple, No lymphadenopathy appreciated  Respiratory:   Symmetrical chest wall movement, Good air movement bilaterally although somewhat decreased right base, CTAB. Room Air  Cardiac:  RRR, No Murmurs, 1-2+ bilateral tibial edema noted, no JVD, No carotid bruits, peripheral pulses palpable at 2+  Abdomen:  Positive bowel sounds, Soft, Non tender, Non distended but obese,  No masses appreciated, unable to appreciate hepatosplenomegaly secondary to large abdominal girth  Skin:  No Cyanosis, Normal Skin Turgor  Extremities: Symmetrical without obvious trauma or injury,  no effusions.  Data Review  CBC  Recent Labs Lab 08/31/15 0021  WBC 4.5  HGB 11.0*  HCT 33.7*  PLT 69*  MCV 92.8  MCH 30.3  MCHC 32.6  RDW 14.5  LYMPHSABS 0.8  MONOABS 1.1*  EOSABS 0.2  BASOSABS 0.0    Chemistries   Recent Labs Lab 08/31/15 0021  NA 135  K 4.8  CL 108  CO2 19*  GLUCOSE 139*  BUN 26*  CREATININE 1.59*  CALCIUM 8.7*  AST 44*  ALT 23  ALKPHOS 103  BILITOT 1.6*    estimated creatinine clearance is 71.9 mL/min (by C-G formula based on Cr of 1.59).  No results for input(s): TSH, T4TOTAL, T3FREE, THYROIDAB in the last 72 hours.  Invalid input(s): FREET3  Coagulation profile No results for input(s): INR, PROTIME in the last 168 hours.  No results for input(s): DDIMER in the last 72 hours.  Cardiac Enzymes No results for input(s): CKMB, TROPONINI, MYOGLOBIN in the last 168 hours.  Invalid input(s): CK  Invalid input(s): POCBNP  Urinalysis    Component Value Date/Time   COLORURINE YELLOW 06/10/2015 1120   APPEARANCEUR CLEAR 06/10/2015 1120   LABSPEC 1.010 06/10/2015 1120   PHURINE 5.5 06/10/2015 1120   GLUCOSEU NEGATIVE 06/10/2015 1120   HGBUR NEGATIVE 06/10/2015 1120   BILIRUBINUR NEGATIVE 06/10/2015 1120   KETONESUR NEGATIVE 06/10/2015 1120   PROTEINUR NEGATIVE 06/10/2015 1120   UROBILINOGEN 1.0 02/23/2014 1710   NITRITE NEGATIVE 06/10/2015 1120   LEUKOCYTESUR NEGATIVE 06/10/2015 1120    Imaging results:   Dg  Chest 2 View  08/31/2015  CLINICAL DATA:  Acute onset of shortness of breath and generalized  weakness. Initial encounter. EXAM: CHEST  2 VIEW COMPARISON:  Chest radiograph performed 06/30/2015 FINDINGS: An increasing small to moderate right-sided pleural effusion is noted. Underlying vascular congestion is seen. Bibasilar airspace opacities may reflect mild pulmonary edema. No pneumothorax is seen. The heart is borderline normal in size. No acute osseous abnormalities identified. IMPRESSION: Increasing small to moderate right-sided pleural effusion noted. Vascular congestion seen. Bibasilar airspace opacities may reflect mild pulmonary edema. Electronically Signed   By: Garald Balding M.D.   On: 08/31/2015 01:05     EKG: (Independently reviewed)  Sinus tachycardia with unifocal PVCs, ventricular rate 101 bpm, QTC 474 ms, EKG unchanged from previous and no definitive ischemic changes observed   Assessment & Plan  Principal Problem:   Acute respiratory distress  2/2 Acute on chronic diastolic congestive heart failure NYHA Class 1 with associated moderate MR/Right pleural effusion -Patient with fluctuating weights and acute development of orthopnea and paroxysmal dyspnea for the past 24 hours consistent with heart failure exacerbation and confirmed by increased right pleural effusion which has responded to IV Lasix in the past -Telemetry/Obs -IV Lasix 40 mg every 12 hours with preadmission spironolactone -Daily weights with strict I/0 -Echocardiogram completed previous admission January 2017 with moderate MR and biatrial dilatation with normal RV systolic function -No beta blocker secondary to acute exacerbation -No ACE inhibitor secondary to chronic kidney disease -Weight in ER 333 pounds  Active Problems:   Acute hepatic encephalopathy /Liver cirrhosis secondary to NASH -Continue lactulose but increase to 30 g 4 times a day and continue rifaximin -Repeat ammonia level in a.m. -Patient alert  and able to ambulate without assistance so will allow diet -Patient reports is on [facility] list at [facility] -Has chronic thrombocytopenia which is stable and near baseline of between 60 and 100,000     Essential hypertension, benign -Blood pressure moderately controlled -Management primarily with diuretics prior to admission    CKD, stage III -Renal function stable and at baseline (BUN 23 and creatinine 1.49)    OSA  -Continue nocturnal CPAP   ? DM -This has been previously listed as a problem but patient does not take medications -We'll follow CBGs -Check hemoglobin A1c     Morbid obesity  -BMI 44     DVT Prophylaxis: SCDs  Family Communication:   Wife at bedside  Code Status:  Full code  Condition:  Stable  Discharge disposition: To [place] discharge back to previous home environment once heart failure compensated and ammonia level improved with associated improvement in mentation  Time spent in minutes : 60      Amya Hlad L. ANP on 08/31/2015 at 8:19 AM  You may contact me by going to [email or web address] - password [email or web address]  I am available from 7a-7p but please confirm I am on the schedule by going to Amion as above.   After 7p please contact night coverage person covering me after hours  [facility]

## 2015-09-01 DIAGNOSIS — I5033 Acute on chronic diastolic (congestive) heart failure: Secondary | ICD-10-CM | POA: Diagnosis not present

## 2015-09-01 LAB — COMPREHENSIVE METABOLIC PANEL
ALBUMIN: 2.5 g/dL — AB (ref 3.5–5.0)
ALK PHOS: 86 U/L (ref 38–126)
ALT: 21 U/L (ref 17–63)
AST: 37 U/L (ref 15–41)
Anion gap: 8 (ref 5–15)
BILIRUBIN TOTAL: 2.5 mg/dL — AB (ref 0.3–1.2)
BUN: 25 mg/dL — AB (ref 6–20)
CALCIUM: 8.9 mg/dL (ref 8.9–10.3)
CO2: 21 mmol/L — ABNORMAL LOW (ref 22–32)
CREATININE: 1.5 mg/dL — AB (ref 0.61–1.24)
Chloride: 108 mmol/L (ref 101–111)
GFR calc Af Amer: 55 mL/min — ABNORMAL LOW (ref >60)
GFR, EST NON AFRICAN AMERICAN: 47 mL/min — AB (ref >60)
GLUCOSE: 84 mg/dL (ref 65–99)
POTASSIUM: 4.4 mmol/L (ref 3.5–5.1)
Sodium: 137 mmol/L (ref 135–145)
TOTAL PROTEIN: 5.4 g/dL — AB (ref 6.5–8.1)

## 2015-09-01 LAB — CBC
HEMATOCRIT: 32.7 % — AB (ref 39.0–52.0)
Hemoglobin: 10.9 g/dL — ABNORMAL LOW (ref 13.0–17.0)
MCH: 31.1 pg (ref 26.0–34.0)
MCHC: 33.3 g/dL (ref 30.0–36.0)
MCV: 93.2 fL (ref 78.0–100.0)
PLATELETS: 68 10*3/uL — AB (ref 150–400)
RBC: 3.51 MIL/uL — ABNORMAL LOW (ref 4.22–5.81)
RDW: 14.7 % (ref 11.5–15.5)
WBC: 3.8 10*3/uL — ABNORMAL LOW (ref 4.0–10.5)

## 2015-09-01 LAB — HEMOGLOBIN A1C
Hgb A1c MFr Bld: 5.1 % (ref 4.8–5.6)
Mean Plasma Glucose: 100 mg/dL

## 2015-09-01 LAB — GLUCOSE, CAPILLARY
GLUCOSE-CAPILLARY: 79 mg/dL (ref 65–99)
GLUCOSE-CAPILLARY: 92 mg/dL (ref 65–99)
Glucose-Capillary: 74 mg/dL (ref 65–99)
Glucose-Capillary: 75 mg/dL (ref 65–99)
Glucose-Capillary: 110 mg/dL — ABNORMAL HIGH (ref 65–99)

## 2015-09-01 LAB — AMMONIA: Ammonia: 111 umol/L — ABNORMAL HIGH (ref 9–35)

## 2015-09-01 MED ORDER — SIMETHICONE 80 MG PO CHEW
80.0000 mg | CHEWABLE_TABLET | Freq: Once | ORAL | Status: DC
Start: 2015-09-01 — End: 2015-09-01

## 2015-09-01 NOTE — Care Management Obs Status (Signed)
[facility] NOTIFICATION   Patient Details  Name: Peter Larson MRN: [id number] Date of Birth: 03-24-1951   Medicare Observation Status Notification Given:  Yes    Royston Bake, RN 09/01/2015, 12:27 PM

## 2015-09-01 NOTE — Discharge Summary (Signed)
Physician Discharge Summary  Peter Larson [id number] DOB: 06-Aug-1950 DOA: 08/31/2015  PCP: [name], MD  Admit date: 08/31/2015 Discharge date: 09/01/2015  Time spent: 35 minutes  Recommendations for Outpatient Follow-up:  1. Needs Close follow-up at [facility] 2. Needs CMET/INR/CBC as OP 3. Recommend strictly enforcing the Lactulose dosing as OP  Discharge Diagnoses:  Principal Problem:   Acute on chronic diastolic congestive heart failure (HCC) Active Problems:   OSA (obstructive sleep apnea)   Essential hypertension, benign   CKD (chronic kidney disease), stage III   Morbid obesity (HCC)   Liver cirrhosis secondary to NASH   Hepatic encephalopathy (HCC)   Pleural effusion on right   Acute respiratory distress (HCC)   Moderate mitral regurgitation   Acute on chronic diastolic CHF (congestive heart failure), NYHA class 1 ([place])   Discharge Condition:  Much improved  Diet recommendation:  heart healthy low salt  Filed Weights   08/31/15 0000 08/31/15 1024 09/01/15 0458  Weight: 151.048 kg (333 lb) 151.592 kg (334 lb 3.2 oz) 149.007 kg (328 lb 8 oz)    History of present illness:  65 y/o ? Obstructive sleep apnea on Noct CPAP-foll in past Dr. Annamaria Boots Congestive heart failure based on echo 07/01/15 space is 55-60%, + pulmonary hypertension + grade 2 diastolic dysfunction OVC's Morbid obesity, Body mass index is 43.35 kg/(m^2). 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 June 2017 PVCs  Admitted to [facility] metabolic encephalopathy  This rapidly resolved.  Patient was much more oriented and less confused  He admitted to non-compliance on Lactulose for the past 1-2 weeks  He was d/c home and recommneded to follow up at [facility]  Discharge Exam: Filed Vitals:   09/01/15 1051 09/01/15 1230   BP: 111/57 99/46  Pulse: 85 87  Temp:  97.8 F (36.6 C)  Resp: 18 18    General: eomi ncat Cardiovascular:  s1 s2 no m/r/g Respiratory: clear no added sound  Discharge Instructions   Discharge Instructions    Diet - low sodium heart healthy    Complete by:  As directed      Discharge instructions    Complete by:  As directed   Follow up with [facility] for further liver care Do not miss any doses of Lactulose     Increase activity slowly    Complete by:  As directed           Current Discharge Medication List    CONTINUE these medications which have NOT CHANGED   Details  cholecalciferol (VITAMIN D) 1000 UNITS tablet Take 3,000 Units by mouth every morning.     esomeprazole (NEXIUM) 40 MG capsule Take 40 mg by mouth daily at 12 noon.    furosemide (LASIX) 40 MG tablet Take 1 tablet (40 mg total) by mouth 2 (two) times daily. Qty: 60 tablet, Refills: 6    hydrOXYzine (ATARAX/VISTARIL) 25 MG tablet Take 25 mg by mouth every 8 (eight) hours.    lactulose (CHRONULAC) 10 GM/15ML solution Take 45 mLs (30 g total) by mouth 4 (four) times daily. Qty: 240 mL, Refills: 0    milk thistle 175 MG tablet Take 350 mg by mouth every morning.     Multiple Vitamin (MULTIVITAMIN) tablet Take 1 tablet by mouth every morning.     rifaximin (XIFAXAN) 550 MG TABS tablet Take 1 tablet (550  mg total) by mouth 2 (two) times daily. Qty: 60 tablet, Refills: 0    spironolactone (ALDACTONE) 50 MG tablet Take 1 tablet (50 mg total) by mouth 2 (two) times daily. Qty: 60 tablet, Refills: 6    zinc sulfate 220 MG capsule Take 220 mg by mouth daily.    famotidine (PEPCID) 20 MG tablet Take 1 tablet (20 mg total) by mouth as needed for heartburn or indigestion.    simethicone (MYLICON) 80 MG chewable tablet Chew 1 tablet (80 mg total) by mouth 2 (two) times daily.       No Known Allergies Follow-up Information    Follow up with [name], MD.   Specialty:  Family Medicine   Contact  information:   [place] [id number] [phone]        The results of significant diagnostics from this hospitalization (including imaging, microbiology, ancillary and laboratory) are listed below for reference.    Significant Diagnostic Studies: Dg Chest 2 View  08/31/2015  CLINICAL DATA:  Acute onset of shortness of breath and generalized weakness. Initial encounter. EXAM: CHEST  2 VIEW COMPARISON:  Chest radiograph performed 06/30/2015 FINDINGS: An increasing small to moderate right-sided pleural effusion is noted. Underlying vascular congestion is seen. Bibasilar airspace opacities may reflect mild pulmonary edema. No pneumothorax is seen. The heart is borderline normal in size. No acute osseous abnormalities identified. IMPRESSION: Increasing small to moderate right-sided pleural effusion noted. Vascular congestion seen. Bibasilar airspace opacities may reflect mild pulmonary edema. Electronically Signed   By: Garald Balding M.D.   On: 08/31/2015 01:05    Microbiology: No results found for this or any previous visit (from the past 240 hour(s)).   Labs: Basic Metabolic Panel:  Recent Labs Lab 08/31/15 0021 08/31/15 0817 09/01/15 0321  NA 135  --  137  K 4.8  --  4.4  CL 108  --  108  CO2 19*  --  21*  GLUCOSE 139*  --  84  BUN 26*  --  25*  CREATININE 1.59*  --  1.50*  CALCIUM 8.7*  --  8.9  MG  --  1.9  --    Liver Function Tests:  Recent Labs Lab 08/31/15 0021 09/01/15 0321  AST 44* 37  ALT 23 21  ALKPHOS 103 86  BILITOT 1.6* 2.5*  PROT 5.8* 5.4*  ALBUMIN 2.6* 2.5*   No results for input(s): LIPASE, AMYLASE in the last 168 hours.  Recent Labs Lab 08/31/15 0020 09/01/15 0331  AMMONIA 172* 111*   CBC:  Recent Labs Lab 08/31/15 0021 09/01/15 0321  WBC 4.5 3.8*  NEUTROABS 2.4  --   HGB 11.0* 10.9*  HCT 33.7* 32.7*  MCV 92.8 93.2  PLT 69* 68*   Cardiac Enzymes: No results for input(s): CKTOTAL, CKMB, CKMBINDEX,  TROPONINI in the last 168 hours. BNP: BNP (last 3 results)  Recent Labs  06/10/15 0930 06/30/15 1948 07/01/15 0414  BNP 79.9 80.9 89.0    ProBNP (last 3 results) No results for input(s): PROBNP in the last 8760 hours.  CBG:  Recent Labs Lab 09/01/15 0209 09/01/15 0610 09/01/15 0754 09/01/15 0830 09/01/15 1237  GLUCAP 79 74 75 110* 92       Signed:  Nita Sells MD   [facility] 09/01/2015, 3:22 PM

## 2015-09-03 ENCOUNTER — Encounter (HOSPITAL_COMMUNITY): Payer: Self-pay | Admitting: Emergency Medicine

## 2015-09-03 ENCOUNTER — Observation Stay (HOSPITAL_COMMUNITY)
Admission: EM | Admit: 2015-09-03 | Discharge: 2015-09-05 | Disposition: A | Discharge status: Home / Self Care | Payer: 59 | Attending: Internal Medicine | Admitting: Internal Medicine

## 2015-09-03 ENCOUNTER — Emergency Department (HOSPITAL_COMMUNITY): Payer: 59

## 2015-09-03 DIAGNOSIS — K7682 Hepatic encephalopathy: Secondary | ICD-10-CM | POA: Diagnosis present

## 2015-09-03 DIAGNOSIS — I130 Hypertensive heart and chronic kidney disease with heart failure and stage 1 through stage 4 chronic kidney disease, or unspecified chronic kidney disease: Secondary | ICD-10-CM | POA: Insufficient documentation

## 2015-09-03 DIAGNOSIS — K7460 Unspecified cirrhosis of liver: Secondary | ICD-10-CM | POA: Diagnosis not present

## 2015-09-03 DIAGNOSIS — I340 Nonrheumatic mitral (valve) insufficiency: Secondary | ICD-10-CM | POA: Diagnosis present

## 2015-09-03 DIAGNOSIS — I10 Essential (primary) hypertension: Secondary | ICD-10-CM | POA: Diagnosis present

## 2015-09-03 DIAGNOSIS — E1122 Type 2 diabetes mellitus with diabetic chronic kidney disease: Secondary | ICD-10-CM | POA: Insufficient documentation

## 2015-09-03 DIAGNOSIS — K7200 Acute and subacute hepatic failure without coma: Secondary | ICD-10-CM | POA: Diagnosis present

## 2015-09-03 DIAGNOSIS — Z6841 Body Mass Index (BMI) 40.0 and over, adult: Secondary | ICD-10-CM | POA: Insufficient documentation

## 2015-09-03 DIAGNOSIS — N189 Chronic kidney disease, unspecified: Secondary | ICD-10-CM

## 2015-09-03 DIAGNOSIS — N179 Acute kidney failure, unspecified: Secondary | ICD-10-CM | POA: Diagnosis not present

## 2015-09-03 DIAGNOSIS — R262 Difficulty in walking, not elsewhere classified: Secondary | ICD-10-CM | POA: Insufficient documentation

## 2015-09-03 DIAGNOSIS — R269 Unspecified abnormalities of gait and mobility: Secondary | ICD-10-CM | POA: Diagnosis not present

## 2015-09-03 DIAGNOSIS — N183 Chronic kidney disease, stage 3 (moderate): Secondary | ICD-10-CM | POA: Diagnosis not present

## 2015-09-03 DIAGNOSIS — J90 Pleural effusion, not elsewhere classified: Secondary | ICD-10-CM | POA: Diagnosis not present

## 2015-09-03 DIAGNOSIS — I5032 Chronic diastolic (congestive) heart failure: Secondary | ICD-10-CM | POA: Diagnosis not present

## 2015-09-03 DIAGNOSIS — K7581 Nonalcoholic steatohepatitis (NASH): Secondary | ICD-10-CM | POA: Insufficient documentation

## 2015-09-03 DIAGNOSIS — R531 Weakness: Secondary | ICD-10-CM | POA: Insufficient documentation

## 2015-09-03 DIAGNOSIS — G4733 Obstructive sleep apnea (adult) (pediatric): Secondary | ICD-10-CM | POA: Insufficient documentation

## 2015-09-03 DIAGNOSIS — K7290 Hepatic failure, unspecified without coma: Secondary | ICD-10-CM | POA: Diagnosis present

## 2015-09-03 DIAGNOSIS — R197 Diarrhea, unspecified: Secondary | ICD-10-CM | POA: Insufficient documentation

## 2015-09-03 DIAGNOSIS — D649 Anemia, unspecified: Secondary | ICD-10-CM | POA: Diagnosis not present

## 2015-09-03 LAB — AMMONIA
AMMONIA: 201 umol/L — AB (ref 9–35)
AMMONIA: 102 umol/L — AB (ref 9–35)

## 2015-09-03 LAB — CBC WITH DIFFERENTIAL/PLATELET
Basophils Absolute: 0 10*3/uL (ref 0.0–0.1)
Basophils Relative: 0 %
EOS PCT: 3 %
Eosinophils Absolute: 0.1 10*3/uL (ref 0.0–0.7)
HCT: 34.5 % — ABNORMAL LOW (ref 39.0–52.0)
Hemoglobin: 12 g/dL — ABNORMAL LOW (ref 13.0–17.0)
Lymphocytes Relative: 10 %
Lymphs Abs: 0.6 10*3/uL — ABNORMAL LOW (ref 0.7–4.0)
MCH: 32 pg (ref 26.0–34.0)
MCHC: 34.8 g/dL (ref 30.0–36.0)
MCV: 92 fL (ref 78.0–100.0)
MONOS PCT: 16 %
Monocytes Absolute: 0.9 10*3/uL (ref 0.1–1.0)
Neutro Abs: 3.9 10*3/uL (ref 1.7–7.7)
Neutrophils Relative %: 71 %
PLATELETS: 69 10*3/uL — AB (ref 150–400)
RBC: 3.75 MIL/uL — ABNORMAL LOW (ref 4.22–5.81)
RDW: 14.5 % (ref 11.5–15.5)
WBC: 5.5 10*3/uL (ref 4.0–10.5)

## 2015-09-03 LAB — URINALYSIS, ROUTINE W REFLEX MICROSCOPIC
Bilirubin Urine: NEGATIVE
Glucose, UA: NEGATIVE mg/dL
Hgb urine dipstick: NEGATIVE
KETONES UR: NEGATIVE mg/dL
LEUKOCYTES UA: NEGATIVE
NITRITE: NEGATIVE
PROTEIN: NEGATIVE mg/dL
Specific Gravity, Urine: 1.013 (ref 1.005–1.030)
pH: 5.5 (ref 5.0–8.0)

## 2015-09-03 LAB — COMPREHENSIVE METABOLIC PANEL
ALT: 25 U/L (ref 17–63)
AST: 42 U/L — AB (ref 15–41)
Albumin: 2.7 g/dL — ABNORMAL LOW (ref 3.5–5.0)
Alkaline Phosphatase: 84 U/L (ref 38–126)
Anion gap: 10 (ref 5–15)
BUN: 42 mg/dL — AB (ref 6–20)
CHLORIDE: 106 mmol/L (ref 101–111)
CO2: 17 mmol/L — ABNORMAL LOW (ref 22–32)
Calcium: 8.9 mg/dL (ref 8.9–10.3)
Creatinine, Ser: 2.17 mg/dL — ABNORMAL HIGH (ref 0.61–1.24)
GFR, EST AFRICAN AMERICAN: 35 mL/min — AB (ref >60)
GFR, EST NON AFRICAN AMERICAN: 30 mL/min — AB (ref >60)
Glucose, Bld: 107 mg/dL — ABNORMAL HIGH (ref 65–99)
POTASSIUM: 4.4 mmol/L (ref 3.5–5.1)
Sodium: 133 mmol/L — ABNORMAL LOW (ref 135–145)
Total Bilirubin: 2.4 mg/dL — ABNORMAL HIGH (ref 0.3–1.2)
Total Protein: 5.8 g/dL — ABNORMAL LOW (ref 6.5–8.1)

## 2015-09-03 LAB — SODIUM, URINE, RANDOM: SODIUM UR: 26 mmol/L

## 2015-09-03 LAB — PROCALCITONIN: Procalcitonin: 0.39 ng/mL

## 2015-09-03 LAB — CBG MONITORING, ED: Glucose-Capillary: 81 mg/dL (ref 65–99)

## 2015-09-03 MED ORDER — ZINC SULFATE 220 (50 ZN) MG PO CAPS
220.0000 mg | ORAL_CAPSULE | Freq: Every day | ORAL | Status: DC
  Administered 2015-09-03: 220 mg via ORAL
  Filled 2015-09-03 (×3): qty 1

## 2015-09-03 MED ORDER — ONDANSETRON HCL 4 MG/2ML IJ SOLN: 4.0000 mg | Freq: Four times a day (QID) | INTRAMUSCULAR | Status: DC | PRN

## 2015-09-03 MED ORDER — SODIUM CHLORIDE 0.9 % IV SOLN
INTRAVENOUS | Status: DC
  Administered 2015-09-03 – 2015-09-04 (×2): via INTRAVENOUS

## 2015-09-03 MED ORDER — ACETAMINOPHEN 650 MG RE SUPP: 650.0000 mg | Freq: Four times a day (QID) | RECTAL | Status: DC | PRN

## 2015-09-03 MED ORDER — RIFAXIMIN 550 MG PO TABS
550.0000 mg | ORAL_TABLET | Freq: Two times a day (BID) | ORAL | Status: DC
  Administered 2015-09-03 – 2015-09-05 (×4): 550 mg via ORAL
  Filled 2015-09-03 (×4): qty 1

## 2015-09-03 MED ORDER — PROCHLORPERAZINE EDISYLATE 5 MG/ML IJ SOLN: 10.0000 mg | Freq: Four times a day (QID) | INTRAMUSCULAR | Status: DC | PRN

## 2015-09-03 MED ORDER — ADULT MULTIVITAMIN W/MINERALS CH
1.0000 | ORAL_TABLET | Freq: Every day | ORAL | Status: DC
  Administered 2015-09-04 – 2015-09-05 (×2): 1 via ORAL
  Filled 2015-09-03 (×2): qty 1

## 2015-09-03 MED ORDER — HYDROXYZINE HCL 25 MG PO TABS
25.0000 mg | ORAL_TABLET | Freq: Every evening | ORAL | Status: DC
  Administered 2015-09-03 – 2015-09-04 (×2): 25 mg via ORAL
  Filled 2015-09-03 (×2): qty 1

## 2015-09-03 MED ORDER — ACETAMINOPHEN 325 MG PO TABS: 650.0000 mg | ORAL_TABLET | Freq: Four times a day (QID) | ORAL | Status: DC | PRN

## 2015-09-03 MED ORDER — ONE-DAILY MULTI VITAMINS PO TABS: 1.0000 | ORAL_TABLET | Freq: Every morning | ORAL | Status: DC

## 2015-09-03 MED ORDER — FAMOTIDINE 20 MG PO TABS: 20.0000 mg | ORAL_TABLET | Freq: Two times a day (BID) | ORAL | Status: DC | PRN

## 2015-09-03 MED ORDER — SODIUM CHLORIDE 0.9 % IV BOLUS (SEPSIS)
500.0000 mL | Freq: Once | INTRAVENOUS | Status: AC
  Administered 2015-09-03: 500 mL via INTRAVENOUS

## 2015-09-03 NOTE — Progress Notes (Signed)
Pt refusing CPAP at this time. I told him to call if he changes his mind.  

## 2015-09-03 NOTE — ED Provider Notes (Signed)
CSN: [id number]     Arrival date & time 09/03/15  [id number] History   First MD Initiated Contact with Patient 09/03/15 [phone]     Chief Complaint  Patient presents with  . Altered Mental Status     (Consider location/radiation/quality/duration/timing/severity/associated sxs/prior Treatment) HPI Peter Larson is a 65 y.o. male history of Karlene Lineman followed by [facility] liver clinic, hepatic encephalopathy, chronic diastolic heart failure, morbid obesity, recent hospital admission last week for hepatic encephalopathy comes in for evaluation of altered mental status. Wife at bedside reports patient appeared well and was at baseline when he was discharged on Wednesday, but over the past 2 days he is gradually become more confused and argumentative. She reports the symptoms are typical when he is encephalopathic. They tried increasing the lactulose dose, but this appears to be ineffective. Patient at this time is alert and oriented 4. Is continuing to take prescribed dose of Lasix. He denies any discomfort, no abdominal pain, chest pain, shortness of breath. He does report persistent coughing, but this is baseline per patient and wife at bedside. No other modifying factors  Upon further questioning, wife does report yesterday patient had episode of large-volume diarrhea.  Past Medical History  Diagnosis Date  . Sleep apnea   . Splenomegaly     slt thrombocytopenia;no complics. egd [id number] neg for varices, ?mild portal gastropathy; 02/2011 nl,novarices  . 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 Hypertension   . Adenomatous colon polyp '01 & '08  . Hemorrhoids     Severe anorectal pain and anal fissure w bleeding following hemorrhoidectomy may 2010  . Family history of colon cancer     mother --59  . Seasonal allergies   . Screening     Hepatocellular Screening CT neg 05/2008, U/S neg 04/2009,05/2010  . Dysphagia     BaS/tab neg 8/09; egd's neg for ring/strict--?dysmotility  . Chronic  diastolic congestive heart failure ([place])     a.  ECHO 01/13/14: EF 55-60%; moderate LVH, G2DD, biatrial enlargement; mild RVE; moderate MR; mildly elevated, pulm pressures.  . OSA (obstructive sleep apnea)   . Morbid obesity ([place])   . Frequent PVCs   . Pancytopenia ([place])   . Cirrhosis ([place])     a. Due to NASH, followed by [facility].  Marland Kitchen Shortness of breath   . Arthritis     RA  " ALL OVER "  . Dilatation of aorta (HCC)     a. Mild aortic root dilitation by echo 01/13/14  . Chronic hepatitis ([place])     cirrhosis  . Hepatic encephalopathy ([place])   . CKD (chronic kidney disease)   . Pleural effusion 08/2015   Past Surgical History  Procedure Laterality Date  . Fissure surgery    . Hemorrhoid surgery    . Esophagogastroduodenoscopy (egd) with propofol N/A 03/11/2015    Procedure: ESOPHAGOGASTRODUODENOSCOPY (EGD) WITH PROPOFOL;  Surgeon: Ronald Lobo, MD;  Location: [facility] ENDOSCOPY;  Service: Endoscopy;  Laterality: N/A;  . Colonoscopy with propofol N/A 03/11/2015    Procedure: COLONOSCOPY WITH PROPOFOL;  Surgeon: Ronald Lobo, MD;  Location: [facility] ENDOSCOPY;  Service: Endoscopy;  Laterality: N/A;   Family History  Problem Relation Age of Onset  . Cancer Mother     died of breast cancer  . Liver disease Father   . Thyroid disease Sister    Social History  Substance Use Topics  . Smoking status: Never Smoker   . Smokeless tobacco: Never Used  .  Alcohol Use: No     Comment: quit 2008    Review of Systems A 10 point review of systems was completed and was negative except for pertinent positives and negatives as mentioned in the history of present illness     Allergies  Review of patient's allergies indicates no known allergies.  Home Medications   Prior to Admission medications   Medication Sig Start Date End Date Taking? Authorizing Provider  cholecalciferol (VITAMIN D) 1000 UNITS tablet Take 3,000 Units by mouth every morning.    Yes Historical Provider, MD   esomeprazole (NEXIUM) 40 MG capsule Take 40 mg by mouth every evening.    Yes Historical Provider, MD  famotidine (PEPCID) 20 MG tablet Take 1 tablet (20 mg total) by mouth as needed for heartburn or indigestion. 02/24/14  Yes Kinnie Feil, MD  furosemide (LASIX) 40 MG tablet Take 1 tablet (40 mg total) by mouth 2 (two) times daily. 02/01/14  Yes Dayna N Dunn, PA-C  hydrOXYzine (ATARAX/VISTARIL) 25 MG tablet Take 25 mg by mouth every evening.  08/26/15  Yes Historical Provider, MD  lactulose (CHRONULAC) 10 GM/15ML solution Take 45 mLs (30 g total) by mouth 4 (four) times daily. 02/24/14  Yes Kinnie Feil, MD  milk thistle 175 MG tablet Take 350 mg by mouth every morning.    Yes Historical Provider, MD  Multiple Vitamin (MULTIVITAMIN) tablet Take 1 tablet by mouth every morning.    Yes Historical Provider, MD  rifaximin (XIFAXAN) 550 MG TABS tablet Take 1 tablet (550 mg total) by mouth 2 (two) times daily. 02/13/14  Yes Geradine Girt, DO  simethicone (MYLICON) 80 MG chewable tablet Chew 1 tablet (80 mg total) by mouth 2 (two) times daily. 07/03/15  Yes Modena Jansky, MD  spironolactone (ALDACTONE) 50 MG tablet Take 1 tablet (50 mg total) by mouth 2 (two) times daily. 02/01/14  Yes Dayna N Dunn, PA-C  zinc sulfate 220 MG capsule Take 220 mg by mouth daily. 08/17/15 08/16/16 Yes Historical Provider, MD   BP 133/66 mmHg  Pulse 83  Temp(Src) 98.1 F (36.7 C) (Oral)  Resp 18  Wt 144.3 kg  SpO2 98% Physical Exam  Constitutional: He is oriented to person, place, and time. He appears well-developed and well-nourished.  Morbidly obese Caucasian male  HENT:  Head: Normocephalic and atraumatic.  Mouth/Throat: Oropharynx is clear and moist.  Eyes: Conjunctivae are normal. Pupils are equal, round, and reactive to light. Right eye exhibits no discharge. Left eye exhibits no discharge. No scleral icterus.  Neck: Neck supple.  Cardiovascular: Normal rate, regular rhythm and normal heart sounds.    Pulmonary/Chest: Effort normal and breath sounds normal. No respiratory distress. He has no wheezes. He has no rales.  Abdominal: Soft. There is no tenderness.  Musculoskeletal: He exhibits no tenderness.  Neurological: He is alert and oriented to person, place, and time.  Cranial Nerves II-XII grossly intact  Skin: Skin is warm and dry. No rash noted.  Psychiatric: He has a normal mood and affect.  Clear speech but is somewhat slow. Intermittent confused.  Nursing note and vitals reviewed.   ED Course  Procedures (including critical care time) Labs Review Labs Reviewed  CBC WITH DIFFERENTIAL/PLATELET - Abnormal; Notable for the following:    RBC 3.75 (*)    Hemoglobin 12.0 (*)    HCT 34.5 (*)    Platelets 69 (*)    Lymphs Abs 0.6 (*)    All other components within normal limits  COMPREHENSIVE METABOLIC PANEL - Abnormal; Notable for the following:    Sodium 133 (*)    CO2 17 (*)    Glucose, Bld 107 (*)    BUN 42 (*)    Creatinine, Ser 2.17 (*)    Total Protein 5.8 (*)    Albumin 2.7 (*)    AST 42 (*)    Total Bilirubin 2.4 (*)    GFR calc non Af Amer 30 (*)    GFR calc Af Amer 35 (*)    All other components within normal limits  AMMONIA - Abnormal; Notable for the following:    Ammonia 201 (*)    All other components within normal limits  GASTROINTESTINAL PANEL BY PCR, STOOL (REPLACES STOOL CULTURE)  URINE CULTURE  CULTURE, BLOOD (ROUTINE X 2)  CULTURE, BLOOD (ROUTINE X 2)  SODIUM, URINE, RANDOM  URINALYSIS, ROUTINE W REFLEX MICROSCOPIC (NOT AT [facility])  PROCALCITONIN  AMMONIA  CBG MONITORING, ED    Imaging Review Dg Chest 2 View  09/03/2015  CLINICAL DATA:  Altered mental status EXAM: CHEST  2 VIEW COMPARISON:  08/31/2015 FINDINGS: Heart is normal size. Focal airspace opacity in the right lower lobe with layering effusion. No confluent opacity on the left. No acute bony abnormality. IMPRESSION: Layering right pleural effusion with right lower lobe atelectasis or  infiltrate. Electronically Signed   By: Rolm Baptise M.D.   On: 09/03/2015 10:32   I have personally reviewed and evaluated these images and lab results as part of my medical decision-making.   EKG Interpretation None     Meds given in ED:  Medications  0.9 %  sodium chloride infusion (not administered)  hydrOXYzine (ATARAX/VISTARIL) tablet 25 mg (not administered)  zinc sulfate capsule 220 mg (not administered)  famotidine (PEPCID) tablet 20 mg (not administered)  rifaximin (XIFAXAN) tablet 550 mg (not administered)  multivitamin tablet 1 tablet (not administered)  acetaminophen (TYLENOL) tablet 650 mg (not administered)    Or  acetaminophen (TYLENOL) suppository 650 mg (not administered)  ondansetron (ZOFRAN) injection 4 mg (not administered)    Or  prochlorperazine (COMPAZINE) injection 10 mg (not administered)  sodium chloride 0.9 % bolus 500 mL (500 mLs Intravenous New Bag/Given 09/03/15 1210)    Current Discharge Medication List     Filed Vitals:   09/03/15 1130 09/03/15 1145 09/03/15 1202 09/03/15 1246  BP: 105/57 117/64 99/62 133/66  Pulse: 75 74  83  Temp:    98.1 F (36.7 C)  TempSrc:    Oral  Resp: 18 17  18   Weight:    144.3 kg  SpO2: 97% 96%  98%    MDM  Peter Larson is a 65 y.o. male with history of mass, hepatic encephalopathy, chronic kidney disease, here for evaluation of altered metal status. Recently admitted for similar symptoms and discharged from hospital on Wednesday. Wife at bedside reports symptoms have returned and worsened over the past 2 days. On arrival, patient is alert and oriented 4, somewhat slow speech and will intermittently become confused. Labs are concerning for ammonia level of 201, creatinine elevated at 2.17 up from baseline 1.5, BUN also elevated at 42. Chest x-ray shows right pleural effusion that seems to be consistent with previous chest x-ray. At this time, I feel patient would benefit from medical admission for further  evaluation of altered mental status likely due to hepatic encephalopathy.  Discussed with hospitalist, Erin Hearing NP, patient admitted for observation to medical bed.  Final diagnoses:  Hepatic encephalopathy ([place])  Comer Locket, PA-C 09/03/15 1303  Tanna Furry, MD 09/09/15 [phone]

## 2015-09-03 NOTE — Progress Notes (Signed)
Pt arrived to 5M07 @ 1300, Pt A&Ox 2-3, c/o pain 0/10. Pt VS taken, Pt without distress. Family at the bedside. Diet ordered, will monitor.

## 2015-09-03 NOTE — ED Notes (Signed)
Attempted to call report to 5M.  

## 2015-09-03 NOTE — H&P (Signed)
[facility] History and Physical                                                                                    Peter Larson, is a 65 y.o. male  MRN: [id number]   DOB - December 01, 1950  Admit Date - 09/03/2015  Outpatient Primary MD for the patient is [name], MD  Referring MD: Jeneen Rinks / ER  PMH: Past Medical History  Diagnosis Date  . Sleep apnea   . Splenomegaly     slt thrombocytopenia;no complics. egd [id number] neg for varices, ?mild portal gastropathy; 02/2011 nl,novarices  . 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 Hypertension   . Adenomatous colon polyp '01 & '08  . Hemorrhoids     Severe anorectal pain and anal fissure w bleeding following hemorrhoidectomy may 2010  . Family history of colon cancer     mother --9  . Seasonal allergies   . Screening     Hepatocellular Screening CT neg 05/2008, U/S neg 04/2009,05/2010  . Dysphagia     BaS/tab neg 8/09; egd's neg for ring/strict--?dysmotility  . Chronic diastolic congestive heart failure ([place])     a.  ECHO 01/13/14: EF 55-60%; moderate LVH, G2DD, biatrial enlargement; mild RVE; moderate MR; mildly elevated, pulm pressures.  . OSA (obstructive sleep apnea)   . Morbid obesity ([place])   . Frequent PVCs   . Pancytopenia ([place])   . Cirrhosis ([place])     a. Due to NASH, followed by [facility].  Marland Kitchen Shortness of breath   . Arthritis     RA  " ALL OVER "  . Dilatation of aorta ([facility])     a. Mild aortic root dilitation by echo 01/13/14  . Chronic hepatitis ([place])     cirrhosis  . Hepatic encephalopathy ([place])   . CKD (chronic kidney disease)   . Pleural effusion 08/2015      PSH: Past Surgical History  Procedure Laterality Date  . Fissure surgery    . Hemorrhoid surgery    . Esophagogastroduodenoscopy (egd) with propofol N/A 03/11/2015    Procedure: ESOPHAGOGASTRODUODENOSCOPY (EGD) WITH PROPOFOL;  Surgeon: Ronald Lobo, MD;  Location: [facility]COPY;  Service: Endoscopy;  Laterality: N/A;  .  Colonoscopy with propofol N/A 03/11/2015    Procedure: COLONOSCOPY WITH PROPOFOL;  Surgeon: Ronald Lobo, MD;  Location: [facility] ENDOSCOPY;  Service: Endoscopy;  Laterality: N/A;     CC:  Chief Complaint  Patient presents with  . Altered Mental Status     HPI: 65 year old male patient known to me from previous admission H/P I did on 3/28. He has a history of hypertension, sleep apnea on CPAP, chronic diastolic heart failure related to moderate mitral regurgitation, chronic kidney disease stage III, Nash cirrhosis and morbid obesity. He was recently admitted because of hepatic encephalopathy and decompensated diastolic heart failure. During the hospitalization lactulose dosage was increased and he was briefly diuresed with IV Lasix. His heart failure was compensated and his mentation had returned to baseline and he was subsequently discharged when 4 hours later on 3/29.  Since discharge patient has taken his medications as prescribed and there  were no changes in his medications upon discharge. Wife reports that yesterday patient began having nausea and vomiting multiple episodes as well as watery diarrhea which is not consistent with the loose stools he has after taking lactulose. Since onset of symptoms patient has had poor appetite and poor oral intake primarily eating Jell-O and clear liquids. He's continued to take his lactulose and his diuretics and wife has noted urine output is the same but the urine is more dark in appearance. He has not had any abdominal pain, fevers or chills. Because of the symptoms the wife took him to see his primary care physician yesterday and labs were obtained and he was given a shot of Phenergan and discharged back to home. Wife reports that since that time patient has become more agitated and somewhat belligerent which is not typical for him even with his usual hepatic encephalopathy. Because of the symptoms they opted to present to the ER early this morning. Prior to  coming to the ER patient was given his usual lactulose, Lasix and Aldactone.  ER Evaluation and treatment: No temperature measurement has been obtained in the ER-BP 120/87, pulse 88, respirations 16 Orthostatic vital signs were checked and patient did not have a drop in blood pressure but did have an increase in pulse from 82 bpm to 92 bpm He is not hypoxic EKG: Sinus rhythm with ventricular rate 82 bpm, QTC 457 ms, no ischemic changes Two-view chest x-ray: Persistent layering right pleural effusion but smaller than previous admission radiologist has read as focal airspace opacity in the right lower lobe with layering effusion without confluent opacity on the left side  Review of Systems   In addition to the HPI above,  No Fever-chills, myalgias or other constitutional symptoms No Headache, changes with Vision or hearing, new weakness, tingling, numbness in any extremity, No problems swallowing food or Liquids, indigestion/reflux No Chest pain, Cough or Shortness of Breath, palpitations, orthopnea or DOE No Abdominal pain, melena or hematochezia, no dark tarry stools,  No dysuria, hematuria or flank pain No new skin rashes, lesions, masses or bruises, No new joints pains-aches No recent weight gain or loss No polyuria, polydypsia or polyphagia,  *A full 10 point Review of Systems was done, except as stated above, all other Review of Systems were negative.  Social History Social History  Substance Use Topics  . Smoking status: Never Smoker   . Smokeless tobacco: Never Used  . Alcohol Use: No     Comment: quit 2008    Resides at: Private residence  Lives with: Spouse  Ambulatory status: Without assistive devices-he still works and runs his own business   Family History Family History  Problem Relation Age of Onset  . Cancer Mother     died of breast cancer  . Liver disease Father   . Thyroid disease Sister      Prior to Admission medications   Medication Sig Start  Date End Date Taking? Authorizing Provider  cholecalciferol (VITAMIN D) 1000 UNITS tablet Take 3,000 Units by mouth every morning.    Yes Historical Provider, MD  esomeprazole (NEXIUM) 40 MG capsule Take 40 mg by mouth every evening.    Yes Historical Provider, MD  famotidine (PEPCID) 20 MG tablet Take 1 tablet (20 mg total) by mouth as needed for heartburn or indigestion. 02/24/14  Yes Kinnie Feil, MD  furosemide (LASIX) 40 MG tablet Take 1 tablet (40 mg total) by mouth 2 (two) times daily. 02/01/14  Yes Dayna N  Dunn, PA-C  hydrOXYzine (ATARAX/VISTARIL) 25 MG tablet Take 25 mg by mouth every evening.  08/26/15  Yes Historical Provider, MD  lactulose (CHRONULAC) 10 GM/15ML solution Take 45 mLs (30 g total) by mouth 4 (four) times daily. 02/24/14  Yes Kinnie Feil, MD  milk thistle 175 MG tablet Take 350 mg by mouth every morning.    Yes Historical Provider, MD  Multiple Vitamin (MULTIVITAMIN) tablet Take 1 tablet by mouth every morning.    Yes Historical Provider, MD  rifaximin (XIFAXAN) 550 MG TABS tablet Take 1 tablet (550 mg total) by mouth 2 (two) times daily. 02/13/14  Yes Geradine Girt, DO  simethicone (MYLICON) 80 MG chewable tablet Chew 1 tablet (80 mg total) by mouth 2 (two) times daily. 07/03/15  Yes Modena Jansky, MD  spironolactone (ALDACTONE) 50 MG tablet Take 1 tablet (50 mg total) by mouth 2 (two) times daily. 02/01/14  Yes Dayna N Dunn, PA-C  zinc sulfate 220 MG capsule Take 220 mg by mouth daily. 08/17/15 08/16/16 Yes Historical Provider, MD    No Known Allergies  Physical Exam  Vitals  Blood pressure 99/62, pulse 74, resp. rate 17, SpO2 96 %.   General:  In no acute distress, appears healthy and well nourished  Psych:  Normal affect, Denies Suicidal or Homicidal ideations, Awake Alert, Oriented X 3But mildly confused and occasionally inappropriate and does not have insight as to why he needs to stay in the hospital. Speech and thought patterns are clear and  appropriate  Neuro:   No focal neurological deficits, CN II through XII intact, Strength 5/5 all 4 extremities, Sensation intact all 4 extremities.  ENT:  Ears and Eyes appear Normal, Conjunctivae clear, PER. Moist oral mucosa without erythema or exudates.  Neck:  Supple, No lymphadenopathy appreciated  Respiratory:  Symmetrical chest wall movement, Good air movement bilaterally although decreased in bases bilaterally, CTAB. Room Air  Cardiac:  RRR, No Murmurs, no LE edema noted, no JVD, No carotid bruits, peripheral pulses palpable at 2+  Abdomen:  Positive bowel sounds, Soft, Non tender, Non distended,  No masses appreciated, no obvious hepatosplenomegaly  Skin:  No Cyanosis, Normal Skin Turgor, No Skin Rash or Bruise.  Extremities: Symmetrical without obvious trauma or injury,  no effusions.  Data Review  CBC  Recent Labs Lab 08/31/15 0021 09/01/15 0321 09/03/15 1007  WBC 4.5 3.8* 5.5  HGB 11.0* 10.9* 12.0*  HCT 33.7* 32.7* 34.5*  PLT 69* 68* 69*  MCV 92.8 93.2 92.0  MCH 30.3 31.1 32.0  MCHC 32.6 33.3 34.8  RDW 14.5 14.7 14.5  LYMPHSABS 0.8  --  0.6*  MONOABS 1.1*  --  0.9  EOSABS 0.2  --  0.1  BASOSABS 0.0  --  0.0    Chemistries   Recent Labs Lab 08/31/15 0021 08/31/15 0817 09/01/15 0321 09/03/15 1007  NA 135  --  137 133*  K 4.8  --  4.4 4.4  CL 108  --  108 106  CO2 19*  --  21* 17*  GLUCOSE 139*  --  84 107*  BUN 26*  --  25* 42*  CREATININE 1.59*  --  1.50* 2.17*  CALCIUM 8.7*  --  8.9 8.9  MG  --  1.9  --   --   AST 44*  --  37 42*  ALT 23  --  21 25  ALKPHOS 103  --  86 84  BILITOT 1.6*  --  2.5* 2.4*  estimated creatinine clearance is 52.3 mL/min (by C-G formula based on Cr of 2.17).  No results for input(s): TSH, T4TOTAL, T3FREE, THYROIDAB in the last 72 hours.  Invalid input(s): FREET3  Coagulation profile No results for input(s): INR, PROTIME in the last 168 hours.  No results for input(s): DDIMER in the last 72  hours.  Cardiac Enzymes No results for input(s): CKMB, TROPONINI, MYOGLOBIN in the last 168 hours.  Invalid input(s): CK  Invalid input(s): POCBNP  Urinalysis    Component Value Date/Time   COLORURINE YELLOW 06/10/2015 1120   APPEARANCEUR CLEAR 06/10/2015 1120   LABSPEC 1.010 06/10/2015 1120   PHURINE 5.5 06/10/2015 1120   GLUCOSEU NEGATIVE 06/10/2015 1120   HGBUR NEGATIVE 06/10/2015 1120   BILIRUBINUR NEGATIVE 06/10/2015 1120   KETONESUR NEGATIVE 06/10/2015 1120   PROTEINUR NEGATIVE 06/10/2015 1120   UROBILINOGEN 1.0 02/23/2014 1710   NITRITE NEGATIVE 06/10/2015 1120   LEUKOCYTESUR NEGATIVE 06/10/2015 1120    Imaging results:   Dg Chest 2 View  09/03/2015  CLINICAL DATA:  Altered mental status EXAM: CHEST  2 VIEW COMPARISON:  08/31/2015 FINDINGS: Heart is normal size. Focal airspace opacity in the right lower lobe with layering effusion. No confluent opacity on the left. No acute bony abnormality. IMPRESSION: Layering right pleural effusion with right lower lobe atelectasis or infiltrate. Electronically Signed   By: Rolm Baptise M.D.   On: 09/03/2015 10:32   Dg Chest 2 View  08/31/2015  CLINICAL DATA:  Acute onset of shortness of breath and generalized weakness. Initial encounter. EXAM: CHEST  2 VIEW COMPARISON:  Chest radiograph performed 06/30/2015 FINDINGS: An increasing small to moderate right-sided pleural effusion is noted. Underlying vascular congestion is seen. Bibasilar airspace opacities may reflect mild pulmonary edema. No pneumothorax is seen. The heart is borderline normal in size. No acute osseous abnormalities identified. IMPRESSION: Increasing small to moderate right-sided pleural effusion noted. Vascular congestion seen. Bibasilar airspace opacities may reflect mild pulmonary edema. Electronically Signed   By: Garald Balding M.D.   On: 08/31/2015 01:05     EKG: (Independently reviewed)  Sinus rhythm with ventricular rate 82 bpm, QTC 457 ms, no ischemic  changes   Assessment & Plan  Principal Problem:   AKI on CKD, stage III -Multifactorial related to new onset watery diarrhea and poor oral intake in the setting of continued use of lactulose and diuretics -Admit to Floor/ Obs -We'll hold Lasix and Aldactone as well as lactulose for least 12 hours (and likely 24 hours) and follow labs before resuming -Not orthostatic but seems volume depleted so we'll give 500 mL normal saline now and begin continuous IV fluids at 75 mL per hour -Repeat labs in a.m.  Active Problems:   Watery diarrhea -Not typical for patient's usual stool consistency related to his lactulose -Was associated with nausea and vomiting yesterday and is nonbloody -Suspect either viral enteritis or possibly C. Difficile (he doesn't have abdominal pain,leukocytosis or fever so this is less likely) -Clear liquids only -IV fluids as above -Hold bowel stimulant (i.e. 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 -Chronic thrombocytopenia and platelets are stable and at baseline    Moderate mitral regurgitation/Chronic diastolic congestive heart failure, NYHA class 1  -Patient is compensated and appears clinically dehydrated -Holding Lasix as above -Weight at discharge was 328 lbs and at admission on 3/28  was 333 lbs -Today's admission weight 318 lbs    Essential hypertension, benign -Typically blood pressures are between 128/67 and 139/67 -Current readings are suboptimal  -Was not on antihypertensive agents prior to admission    Pleural effusion on right -Appears stable and no signs of pulmonary infection and not hypoxic    OSA /Morbid obesity -Continue nocturnal CPAP    DVT Prophylaxis: SCD since platelets less than 60,000  Family Communication:   Wife at bedside  Code Status: Full code  Condition:  Stable  Discharge disposition: Anticipate discharge back to home environment once medically stable and diarrhea and other GI symptoms have resolved  Time spent in minutes : 60      ELLIS,ALLISON L. ANP on 09/03/2015 at 12:12 PM  You may contact me by going to [email or web address] - password [email or web address]  I am available from 7a-7p but please confirm I am on the schedule by going to Amion as above.   After 7p please contact night coverage person covering me after hours  [place] during the described time interval was provided by me and ANP Erin Hearing. I have reviewed this patient's available data, including medical history, events of note, physical examination, and all test results as part of my evaluation. I have personally reviewed and interpreted all radiology studies.

## 2015-09-03 NOTE — ED Notes (Signed)
Pt arrived from home with AMS, wife at bedside and giving details of chief complaint. Pt has hx of liver cirrhosis and was recently admitted for sob 3/27-3/29. Pt had elevated Ammonia from prev admission, and wife feels "afraid he has encephalopathy". Pt a&ox3, with periods of confusion.

## 2015-09-03 NOTE — ED Notes (Signed)
Checked patient cbg it was 81 notified RN of blood sugar  

## 2015-09-04 DIAGNOSIS — I10 Essential (primary) hypertension: Secondary | ICD-10-CM

## 2015-09-04 DIAGNOSIS — I5032 Chronic diastolic (congestive) heart failure: Secondary | ICD-10-CM | POA: Diagnosis not present

## 2015-09-04 DIAGNOSIS — N183 Chronic kidney disease, stage 3 (moderate): Secondary | ICD-10-CM | POA: Diagnosis not present

## 2015-09-04 DIAGNOSIS — G4733 Obstructive sleep apnea (adult) (pediatric): Secondary | ICD-10-CM

## 2015-09-04 DIAGNOSIS — K7290 Hepatic failure, unspecified without coma: Secondary | ICD-10-CM | POA: Diagnosis not present

## 2015-09-04 DIAGNOSIS — A09 Infectious gastroenteritis and colitis, unspecified: Secondary | ICD-10-CM

## 2015-09-04 DIAGNOSIS — N179 Acute kidney failure, unspecified: Secondary | ICD-10-CM

## 2015-09-04 DIAGNOSIS — K7581 Nonalcoholic steatohepatitis (NASH): Secondary | ICD-10-CM

## 2015-09-04 LAB — CBC
HEMATOCRIT: 31.5 % — AB (ref 39.0–52.0)
Hemoglobin: 10.4 g/dL — ABNORMAL LOW (ref 13.0–17.0)
MCH: 30.3 pg (ref 26.0–34.0)
MCHC: 33 g/dL (ref 30.0–36.0)
MCV: 91.8 fL (ref 78.0–100.0)
PLATELETS: 67 10*3/uL — AB (ref 150–400)
RBC: 3.43 MIL/uL — AB (ref 4.22–5.81)
RDW: 14.7 % (ref 11.5–15.5)
WBC: 4.4 10*3/uL (ref 4.0–10.5)

## 2015-09-04 LAB — COMPREHENSIVE METABOLIC PANEL
ALT: 24 U/L (ref 17–63)
ANION GAP: 7 (ref 5–15)
AST: 42 U/L — ABNORMAL HIGH (ref 15–41)
Albumin: 2.4 g/dL — ABNORMAL LOW (ref 3.5–5.0)
Alkaline Phosphatase: 77 U/L (ref 38–126)
BILIRUBIN TOTAL: 1.8 mg/dL — AB (ref 0.3–1.2)
BUN: 44 mg/dL — ABNORMAL HIGH (ref 6–20)
CHLORIDE: 108 mmol/L (ref 101–111)
CO2: 19 mmol/L — ABNORMAL LOW (ref 22–32)
Calcium: 8.6 mg/dL — ABNORMAL LOW (ref 8.9–10.3)
Creatinine, Ser: 1.73 mg/dL — ABNORMAL HIGH (ref 0.61–1.24)
GFR, EST AFRICAN AMERICAN: 46 mL/min — AB (ref >60)
GFR, EST NON AFRICAN AMERICAN: 40 mL/min — AB (ref >60)
Glucose, Bld: 74 mg/dL (ref 65–99)
POTASSIUM: 4.3 mmol/L (ref 3.5–5.1)
Sodium: 134 mmol/L — ABNORMAL LOW (ref 135–145)
TOTAL PROTEIN: 5.2 g/dL — AB (ref 6.5–8.1)

## 2015-09-04 LAB — C DIFFICILE QUICK SCREEN W PCR REFLEX
C DIFFICILE (CDIFF) INTERP: NEGATIVE
C DIFFICILE (CDIFF) TOXIN: NEGATIVE
C DIFFICLE (CDIFF) ANTIGEN: NEGATIVE

## 2015-09-04 LAB — AMMONIA: Ammonia: 168 umol/L — ABNORMAL HIGH (ref 9–35)

## 2015-09-04 MED ORDER — LORAZEPAM 2 MG/ML IJ SOLN
1.0000 mg | Freq: Once | INTRAMUSCULAR | Status: AC
  Administered 2015-09-04: 1 mg via INTRAVENOUS
  Filled 2015-09-04: qty 1

## 2015-09-04 MED ORDER — FAMOTIDINE 20 MG PO TABS
20.0000 mg | ORAL_TABLET | Freq: Two times a day (BID) | ORAL | Status: DC
  Administered 2015-09-04 (×2): 20 mg via ORAL
  Filled 2015-09-04 (×2): qty 1

## 2015-09-04 MED ORDER — LACTULOSE 10 GM/15ML PO SOLN
30.0000 g | Freq: Four times a day (QID) | ORAL | Status: DC
  Administered 2015-09-04 (×2): 30 g via ORAL
  Filled 2015-09-04 (×3): qty 45

## 2015-09-04 NOTE — Progress Notes (Signed)
Patient is refusing CPAP.  RT will continue to monitor.  

## 2015-09-04 NOTE — Progress Notes (Signed)
Wife assisting pt, pt already in the bathroom when rn entered room. Unable to get a stool sample. rn put hat down in toilet to attempt to get stool sample next time pt has a bowel movement.

## 2015-09-04 NOTE — Progress Notes (Signed)
Pt lethargic this morning, rousable to voice, but falls asleep quickly. Pt reports he does not want to stay in the hospital tonight, wife says he must, pt told wife that he would go get a hotel room. Pt has difficulty even getting out of recliner chair, but with lots of effort can use walker and +2 assist to ambulate to the bathroom. Pt more alert after lactulose.

## 2015-09-04 NOTE — Progress Notes (Signed)
Pt observed to be very confused and agitated, refused to stay in bed despite re-orientation, NP Walden Field paged and notified, ordered iv ativan 1mg  same given at [place], pt put on the sitter list with staffing, will however continue to monitor. [place], Zannah Melucci Efe

## 2015-09-04 NOTE — Progress Notes (Signed)
[facility] Progress Note  Patient: Peter Larson [id number]   PCP: [name], MD DOB: 27-Feb-1951   DOA: 09/03/2015   DOS: 09/04/2015   Date of Service: the patient was seen and examined on 09/04/2015  Subjective:   Brief hospital course: Patient was admitted on 09/03/2015, with complaint of nausea vomiting as well as diarrhea. Patient was found to have acute kidney injury as well as hepatitic encephalopathy. Patient was admitted for further workup Currently found is to resume lactulose and monitor clinically.  Assessment and Plan: 1. Acute on chronic kidney disease stage III Likely from poor oral intake as well as excessive diarrhea. Currently patient does not have any further bowel movement while he has been here in the hospital. Renal function is appearing to be close to his baseline as well. With this I would continue to hold Aldactone and monitor daily BMP. Avoid nephrotoxic medication.  2. Nonalcoholic steatohepatitis. Cirrhosis of the liver. Hepatitic encephalopathy. Abdominal distention with suspected ascites. No abdominal tenderness on examination. Ammonia level has been trending downwards. Patient does have asterixis on examination. With this I would resume lactulose every 4 hours. Hold off for 3 loose soft bowel movement. Monitor clinically. At present without any abdominal tenderness and a GI bleed, I suspect the patient does not need any SBP prophylaxis. Monitor for further evidence of bleeding. Continue rifaximin.  3. Chronic diastolic heart failure. Essential hypertension. Blood pressure remained stable. Patient is on Aldactone and Lasix at home. Currently the both are on hold due to acute on chronic kidney damage. We will monitor clinically and resume as needed.  4. Diarrhea. Other was a suspicion for possible GI infection on admission. At present I would check C. difficile PCR. Although possibility of diarrhea likely secondary from lactulose only  is high in this patient.  5. Chronic thrombocytopenia. Associated with the liver disease. Monitor for active bleeding.  6. Chronic anemia. Hemoglobin has dropped 2 g. And no evidence of active bleeding here in the hospital. Likely from dilution. We will continue to monitor clinically   Activity: physical therapy consulted Bowel regimen: last BM for 09/04/2015 DVT Prophylaxis: subcutaneous Heparin Nutrition: Advance to soft diet Advance goals of care discussion: Full code  HPI: As per the H and P dictated on admission, "65 year old male patient known to me from previous admission H/P I did on 3/28. He has a history of hypertension, sleep apnea on CPAP, chronic diastolic heart failure related to moderate mitral regurgitation, chronic kidney disease stage III, Nash cirrhosis and morbid obesity. He was recently admitted because of hepatic encephalopathy and decompensated diastolic heart failure. During the hospitalization lactulose dosage was increased and he was briefly diuresed with IV Lasix. His heart failure was compensated and his mentation had returned to baseline and he was subsequently discharged when 4 hours later on 3/29.  Since discharge patient has taken his medications as prescribed and there were no changes in his medications upon discharge. Wife reports that yesterday patient began having nausea and vomiting multiple episodes as well as watery diarrhea which is not consistent with the loose stools he has after taking lactulose. Since onset of symptoms patient has had poor appetite and poor oral intake primarily eating Jell-O and clear liquids. He's continued to take his lactulose and his diuretics and wife has noted urine output is the same but the urine is more dark in appearance. He has not had any abdominal pain, fevers or chills. Because of the symptoms the wife took him to see his  primary care physician yesterday and labs were obtained and he was given a shot of Phenergan and  discharged back to home. Wife reports that since that time patient has become more agitated and somewhat belligerent which is not typical for him even with his usual hepatic encephalopathy. Because of the symptoms they opted to present to the ER early this morning. Prior to coming to the ER patient was given his usual lactulose, Lasix and Aldactone." Procedures: none Consultants: none Antibiotics: Anti-infectives    Start     Dose/Rate Route Frequency Ordered Stop   09/03/15 2200  rifaximin (XIFAXAN) tablet 550 mg     550 mg Oral 2 times daily 09/03/15 1249        Family Communication: family was present at bedside, at the time of interview.  Opportunity was given to ask question and all questions were answered satisfactorily.   Disposition:  Expected discharge date: 04/02-08/2015 Barriers to safe discharge: Improvement in mental status as well as symptoms   Intake/Output Summary (Last 24 hours) at 09/04/15 1651 Last data filed at 09/04/15 0800  Gross per 24 hour  Intake      0 ml  Output   1250 ml  Net  -1250 ml   Filed Weights   09/03/15 1246  Weight: 144.3 kg (318 lb 2 oz)    Objective: Physical Exam: Filed Vitals:   09/03/15 2200 09/04/15 0111 09/04/15 0553 09/04/15 1453  BP:  114/68 125/62 134/67  Pulse:  85 82 72  Temp:  98.1 F (36.7 C) 97.7 F (36.5 C) 98.3 F (36.8 C)  TempSrc:  Oral Oral Oral  Resp:  20 20 20   Height: 5\' 6"  (1.676 m)     Weight:      SpO2:  92% 100% 97%     General: Appear in moderate distress, no Rash; Oral Mucosa moist. Cardiovascular: S1 and S2 Present, no Murmur, no JVD Respiratory: Bilateral Air entry present and Clear to Auscultation, no Crackles, no wheezes Abdomen: Bowel Sound present, Soft and no tenderness Extremities: trace Pedal edema, no calf tenderness Neurology: Grossly no focal neuro deficit. positive asterixis   Data Reviewed: CBC:  Recent Labs Lab 08/31/15 0021 09/01/15 0321 09/03/15 1007 09/04/15 0416  WBC  4.5 3.8* 5.5 4.4  NEUTROABS 2.4  --  3.9  --   HGB 11.0* 10.9* 12.0* 10.4*  HCT 33.7* 32.7* 34.5* 31.5*  MCV 92.8 93.2 92.0 91.8  PLT 69* 68* 69* 67*   Basic Metabolic Panel:  Recent Labs Lab 08/31/15 0021 08/31/15 0817 09/01/15 0321 09/03/15 1007 09/04/15 0416  NA 135  --  137 133* 134*  K 4.8  --  4.4 4.4 4.3  CL 108  --  108 106 108  CO2 19*  --  21* 17* 19*  GLUCOSE 139*  --  84 107* 74  BUN 26*  --  25* 42* 44*  CREATININE 1.59*  --  1.50* 2.17* 1.73*  CALCIUM 8.7*  --  8.9 8.9 8.6*  MG  --  1.9  --   --   --    Liver Function Tests:  Recent Labs Lab 08/31/15 0021 09/01/15 0321 09/03/15 1007 09/04/15 0416  AST 44* 37 42* 42*  ALT 23 21 25 24   ALKPHOS 103 86 84 77  BILITOT 1.6* 2.5* 2.4* 1.8*  PROT 5.8* 5.4* 5.8* 5.2*  ALBUMIN 2.6* 2.5* 2.7* 2.4*   No results for input(s): LIPASE, AMYLASE in the last 168 hours.  Recent Labs Lab 08/31/15 0020  09/01/15 0331 09/03/15 1007 09/03/15 1537 09/04/15 0416  AMMONIA 172* 111* 201* 102* 168*    Cardiac Enzymes: No results for input(s): CKTOTAL, CKMB, CKMBINDEX, TROPONINI in the last 168 hours.  BNP (last 3 results)  Recent Labs  06/10/15 0930 06/30/15 1948 07/01/15 0414  BNP 79.9 80.9 89.0    CBG:  Recent Labs Lab 09/01/15 0610 09/01/15 0754 09/01/15 0830 09/01/15 1237 09/03/15 1206  GLUCAP 74 75 110* 92 81    Recent Results (from the past 240 hour(s))  Culture, blood (Routine X 2) w Reflex to ID Panel     Status: None (Preliminary result)   Collection Time: 09/03/15 10:07 AM  Result Value Ref Range Status   Specimen Description BLOOD LEFT ANTECUBITAL  Final   Special Requests BOTTLES DRAWN AEROBIC ONLY 3CC  Final   Culture NO GROWTH 1 DAY  Final   Report Status PENDING  Incomplete  Culture, blood (Routine X 2) w Reflex to ID Panel     Status: None (Preliminary result)   Collection Time: 09/03/15 12:21 PM  Result Value Ref Range Status   Specimen Description BLOOD RIGHT ANTECUBITAL   Final   Special Requests BOTTLES DRAWN AEROBIC AND ANAEROBIC 5CC  Final   Culture NO GROWTH 1 DAY  Final   Report Status PENDING  Incomplete  Urine culture     Status: None (Preliminary result)   Collection Time: 09/03/15  8:59 PM  Result Value Ref Range Status   Specimen Description URINE, RANDOM  Final   Special Requests NONE  Final   Culture NO GROWTH < 12 HOURS  Final   Report Status PENDING  Incomplete     Studies: No results found.   Scheduled Meds: . famotidine  20 mg Oral BID  . hydrOXYzine  25 mg Oral QPM  . lactulose  30 g Oral QID  . multivitamin with minerals  1 tablet Oral Daily  . rifaximin  550 mg Oral BID   Continuous Infusions:  PRN Meds: acetaminophen **OR** acetaminophen, ondansetron (ZOFRAN) IV **OR** [DISCONTINUED] prochlorperazine  Time spent: 30 minutes  Author: Berle Mull, MD [facility] Pager: [phone] 09/04/2015 4:51 PM  If 7PM-7AM, please contact night-coverage at [email or web address], password [facility]

## 2015-09-05 DIAGNOSIS — N183 Chronic kidney disease, stage 3 (moderate): Secondary | ICD-10-CM | POA: Diagnosis not present

## 2015-09-05 DIAGNOSIS — K7290 Hepatic failure, unspecified without coma: Secondary | ICD-10-CM | POA: Diagnosis not present

## 2015-09-05 DIAGNOSIS — I340 Nonrheumatic mitral (valve) insufficiency: Secondary | ICD-10-CM

## 2015-09-05 DIAGNOSIS — I5032 Chronic diastolic (congestive) heart failure: Secondary | ICD-10-CM | POA: Diagnosis not present

## 2015-09-05 DIAGNOSIS — N179 Acute kidney failure, unspecified: Secondary | ICD-10-CM | POA: Diagnosis not present

## 2015-09-05 DIAGNOSIS — J948 Other specified pleural conditions: Secondary | ICD-10-CM

## 2015-09-05 LAB — URINE CULTURE

## 2015-09-05 LAB — COMPREHENSIVE METABOLIC PANEL
ALK PHOS: 98 U/L (ref 38–126)
ALT: 28 U/L (ref 17–63)
AST: 50 U/L — ABNORMAL HIGH (ref 15–41)
Albumin: 2.6 g/dL — ABNORMAL LOW (ref 3.5–5.0)
Anion gap: 6 (ref 5–15)
BUN: 37 mg/dL — AB (ref 6–20)
CALCIUM: 8.7 mg/dL — AB (ref 8.9–10.3)
CO2: 21 mmol/L — AB (ref 22–32)
CREATININE: 1.4 mg/dL — AB (ref 0.61–1.24)
Chloride: 108 mmol/L (ref 101–111)
GFR, EST AFRICAN AMERICAN: 60 mL/min — AB (ref >60)
GFR, EST NON AFRICAN AMERICAN: 52 mL/min — AB (ref >60)
Glucose, Bld: 91 mg/dL (ref 65–99)
Potassium: 4.1 mmol/L (ref 3.5–5.1)
SODIUM: 135 mmol/L (ref 135–145)
Total Bilirubin: 1.9 mg/dL — ABNORMAL HIGH (ref 0.3–1.2)
Total Protein: 6 g/dL — ABNORMAL LOW (ref 6.5–8.1)

## 2015-09-05 LAB — GASTROINTESTINAL PANEL BY PCR, STOOL (REPLACES STOOL CULTURE)
ADENOVIRUS F40/41: NOT DETECTED
ASTROVIRUS: NOT DETECTED
CAMPYLOBACTER SPECIES: NOT DETECTED
Cryptosporidium: NOT DETECTED
Cyclospora cayetanensis: NOT DETECTED
E. coli O157: NOT DETECTED
ENTEROAGGREGATIVE E COLI (EAEC): NOT DETECTED
ENTEROPATHOGENIC E COLI (EPEC): NOT DETECTED
Entamoeba histolytica: NOT DETECTED
Enterotoxigenic E coli (ETEC): NOT DETECTED
GIARDIA LAMBLIA: NOT DETECTED
NOROVIRUS GI/GII: DETECTED — AB
PLESIMONAS SHIGELLOIDES: NOT DETECTED
Rotavirus A: NOT DETECTED
Salmonella species: NOT DETECTED
Sapovirus (I, II, IV, and V): NOT DETECTED
Shiga like toxin producing E coli (STEC): NOT DETECTED
Shigella/Enteroinvasive E coli (EIEC): NOT DETECTED
Vibrio cholerae: NOT DETECTED
Vibrio species: NOT DETECTED
Yersinia enterocolitica: NOT DETECTED

## 2015-09-05 LAB — CBC WITH DIFFERENTIAL/PLATELET
Basophils Absolute: 0 10*3/uL (ref 0.0–0.1)
Basophils Relative: 1 %
Eosinophils Absolute: 0.3 10*3/uL (ref 0.0–0.7)
Eosinophils Relative: 8 %
HCT: 35.4 % — ABNORMAL LOW (ref 39.0–52.0)
HEMOGLOBIN: 11.5 g/dL — AB (ref 13.0–17.0)
LYMPHS ABS: 1.1 10*3/uL (ref 0.7–4.0)
LYMPHS PCT: 27 %
MCH: 30 pg (ref 26.0–34.0)
MCHC: 32.5 g/dL (ref 30.0–36.0)
MCV: 92.4 fL (ref 78.0–100.0)
Monocytes Absolute: 0.9 10*3/uL (ref 0.1–1.0)
Monocytes Relative: 21 %
NEUTROS PCT: 44 %
Neutro Abs: 1.9 10*3/uL (ref 1.7–7.7)
Platelets: 78 10*3/uL — ABNORMAL LOW (ref 150–400)
RBC: 3.83 MIL/uL — AB (ref 4.22–5.81)
RDW: 14.9 % (ref 11.5–15.5)
WBC: 4.3 10*3/uL (ref 4.0–10.5)

## 2015-09-05 LAB — PROCALCITONIN: Procalcitonin: 0.16 ng/mL

## 2015-09-05 LAB — AMMONIA: Ammonia: 96 umol/L — ABNORMAL HIGH (ref 9–35)

## 2015-09-05 MED ORDER — LACTULOSE 10 GM/15ML PO SOLN: 30.0000 g | Freq: Three times a day (TID) | ORAL | Status: DC

## 2015-09-05 MED ORDER — LACTULOSE 10 GM/15ML PO SOLN
30.0000 g | Freq: Two times a day (BID) | ORAL | Status: DC
  Administered 2015-09-05: 30 g via ORAL
  Filled 2015-09-05: qty 45

## 2015-09-05 MED ORDER — PANTOPRAZOLE SODIUM 40 MG PO TBEC
40.0000 mg | DELAYED_RELEASE_TABLET | Freq: Every day | ORAL | Status: DC
  Administered 2015-09-05: 40 mg via ORAL
  Filled 2015-09-05: qty 1

## 2015-09-05 MED ORDER — FUROSEMIDE 40 MG PO TABS: 40.0000 mg | ORAL_TABLET | Freq: Two times a day (BID) | ORAL | Status: DC

## 2015-09-05 MED ORDER — SPIRONOLACTONE 25 MG PO TABS
50.0000 mg | ORAL_TABLET | Freq: Two times a day (BID) | ORAL | Status: DC
  Administered 2015-09-05: 50 mg via ORAL
  Filled 2015-09-05: qty 2

## 2015-09-05 MED ORDER — SIMETHICONE 80 MG PO CHEW
80.0000 mg | CHEWABLE_TABLET | Freq: Two times a day (BID) | ORAL | Status: DC
  Administered 2015-09-05: 80 mg via ORAL
  Filled 2015-09-05: qty 1

## 2015-09-05 NOTE — Discharge Instructions (Signed)
Norovirus Infection A norovirus infection is caused by exposure to a virus in a group of similar viruses (noroviruses). This type of infection causes inflammation in your stomach and intestines (gastroenteritis). Norovirus is the most common cause of gastroenteritis. It also causes food poisoning. Anyone can get a norovirus infection. It spreads very easily (contagious). You can get it from contaminated food, water, surfaces, or other people. Norovirus is found in the stool or vomit of infected people. You can spread the infection as soon as you feel sick until 2 weeks after you recover.  Symptoms usually begin within 2 days after you become infected. Most norovirus symptoms affect the digestive system. CAUSES Norovirus infection is caused by contact with norovirus. You can catch norovirus if you:  Eat or drink something contaminated with norovirus.  Touch surfaces or objects contaminated with norovirus and then put your hand in your mouth.  Have direct contact with an infected person who has symptoms.  Share food, drink, or utensils with someone with who is sick with norovirus. SIGNS AND SYMPTOMS Symptoms of norovirus may include:  Nausea.  Vomiting.  Diarrhea.  Stomach cramps.  Fever.  Chills.  Headache.  Muscle aches.  Tiredness. DIAGNOSIS Your health care provider may suspect norovirus based on your symptoms and physical exam. Your health care provider may also test a sample of your stool or vomit for the virus.  TREATMENT There is no specific treatment for norovirus. Most people get better without treatment in about 2 days. HOME CARE INSTRUCTIONS  Replace lost fluids by drinking plenty of water or rehydration fluids containing important minerals called electrolytes. This prevents dehydration. Drink enough fluid to keep your urine clear or pale yellow.  Do not prepare food for others while you are infected. Wait at least 3 days after recovering from the illness to do  that. PREVENTION   Wash your hands often, especially after using the toilet or changing a diaper.  Wash fruits and vegetables thoroughly before preparing or serving them.  Throw out any food that a sick person may have touched.  Disinfect contaminated surfaces immediately after someone in the household has been sick. Use a bleach-based household cleaner.  Immediately remove and wash soiled clothes or sheets. SEEK MEDICAL CARE IF:  Your vomiting, diarrhea, and stomach pain is getting worse.  Your symptoms of norovirus do not go away after 2-3 days. SEEK IMMEDIATE MEDICAL CARE IF:  You develop symptoms of dehydration that do not improve with fluid replacement. This may include:  Excessive sleepiness.  Lack of tears.  Dry mouth.  Dizziness when standing.  Weak pulse.   This information is not intended to replace advice given to you by your health care provider. Make sure you discuss any questions you have with your health care provider.   Document Released: 08/12/2002 Document Revised: 06/12/2014 Document Reviewed: 10/30/2013 Elsevier Interactive Patient Education 2016 [place]. Heart Failure Heart failure means your heart has trouble pumping blood. This makes it hard for your body to work well. Heart failure is usually a long-term (chronic) condition. You must take good care of yourself and follow your doctor's treatment plan. HOME CARE  Take your heart medicine as told by your doctor.  Do not stop taking medicine unless your doctor tells you to.  Do not skip any dose of medicine.  Refill your medicines before they run out.  Take other medicines only as told by your doctor or pharmacist.  Stay active if told by your doctor. The elderly and people  with severe heart failure should talk with a doctor about physical activity.  Eat heart-healthy foods. Choose foods that are without trans fat and are low in saturated fat, cholesterol, and salt (sodium). This includes  fresh or frozen fruits and vegetables, fish, lean meats, fat-free or low-fat dairy foods, whole grains, and high-fiber foods. Lentils and dried peas and beans (legumes) are also good choices.  Limit salt if told by your doctor.  Cook in a healthy way. Roast, grill, broil, bake, poach, steam, or stir-fry foods.  Limit fluids as told by your doctor.  Weigh yourself every morning. Do this after you pee (urinate) and before you eat breakfast. Write down your weight to give to your doctor.  Take your blood pressure and write it down if your doctor tells you to.  Ask your doctor how to check your pulse. Check your pulse as told.  Lose weight if told by your doctor.  Stop smoking or chewing tobacco. Do not use gum or patches that help you quit without your doctor's approval.  Schedule and go to doctor visits as told.  Nonpregnant women should have no more than 1 drink a day. Men should have no more than 2 drinks a day. Talk to your doctor about drinking alcohol.  Stop illegal drug use.  Stay current with shots (immunizations).  Manage your health conditions as told by your doctor.  Learn to manage your stress.  Rest when you are tired.  If it is really hot outside:  Avoid intense activities.  Use air conditioning or fans, or get in a cooler place.  Avoid caffeine and alcohol.  Wear loose-fitting, lightweight, and light-colored clothing.  If it is really cold outside:  Avoid intense activities.  Layer your clothing.  Wear mittens or gloves, a hat, and a scarf when going outside.  Avoid alcohol.  Learn about heart failure and get support as needed.  Get help to maintain or improve your quality of life and your ability to care for yourself as needed. GET HELP IF:   You gain weight quickly.  You are more short of breath than usual.  You cannot do your normal activities.  You tire easily.  You cough more than normal, especially with activity.  You have any or  more puffiness (swelling) in areas such as your hands, feet, ankles, or belly (abdomen).  You cannot sleep because it is hard to breathe.  You feel like your heart is beating fast (palpitations).  You get dizzy or light-headed when you stand up. GET HELP RIGHT AWAY IF:   You have trouble breathing.  There is a change in mental status, such as becoming less alert or not being able to focus.  You have chest pain or discomfort.  You faint. MAKE SURE YOU:   Understand these instructions.  Will watch your condition.  Will get help right away if you are not doing well or get worse.   This information is not intended to replace advice given to you by your health care provider. Make sure you discuss any questions you have with your health care provider.   Document Released: 02/29/2008 Document Revised: 06/12/2014 Document Reviewed: 07/08/2012 Elsevier Interactive Patient Education [employer].

## 2015-09-05 NOTE — Progress Notes (Signed)
Wife reports that pt is at least 80% back to normal. Pt alert and oriented x4, carrying on full conversations.

## 2015-09-05 NOTE — Progress Notes (Signed)
Occupational Therapy Evaluation/Discharge Patient Details Name: Peter Larson MRN: [id number] DOB: 1950-06-06 Today's Date: 09/05/2015    History of Present Illness Patient was admitted on 09/03/2015, with complaint of nausea vomiting as well as diarrhea. Patient was found to have acute kidney injury as well as hepatitic encephalopathy.   Clinical Impression   Patient has been evaluated by Occupational Therapy with no acute OT needs identified. Pt completed ADLs with modified independence and completed transfers and ambulation with supervision and using RW. Pt demonstrated cognitive deficits including disorientation, short-term memory and problem solving deficits, but pt's wife reports that this is his baseline for several years due to his other medical conditions and he has occassional "senior moments." Pt's Sp02 remained between 93-95% at rest and during activity, provided pt with incetive spirometer to practice breathing exercises at home. All education has been completed and pt has no further questions. OT signing off.    Follow Up Recommendations  No OT follow up;Supervision/Assistance - 24 hour    Equipment Recommendations  None recommended by OT    Recommendations for Other Services       Precautions / Restrictions Precautions Precautions: Fall Restrictions Weight Bearing Restrictions: No      Mobility Bed Mobility               General bed mobility comments: Pt up in chair on OT arrival  Transfers Overall transfer level: Needs assistance Equipment used: Rolling walker (2 wheeled) Transfers: Sit to/from Stand Sit to Stand: Supervision         General transfer comment: Supervision for safety. Pt requires extra time and effort and rocks for momentum. Good demonstration of safe hand placement. Reviewed proper use of RW for home use.    Balance Overall balance assessment: Needs assistance Sitting-balance support: No upper extremity supported;Feet  supported Sitting balance-Leahy Scale: Good     Standing balance support: No upper extremity supported;During functional activity Standing balance-Leahy Scale: Fair Standing balance comment: Able to maintain balance for static standing tasks, but requires at least single extremity support for dynamic balance tasks                            ADL Overall ADL's : Needs assistance/impaired         Upper Body Bathing: Modified independent;Sitting   Lower Body Bathing: Modified independent;Sit to/from stand   Upper Body Dressing : Modified independent;Sitting   Lower Body Dressing: Modified independent;Sit to/from stand   Toilet Transfer: Supervision/safety;Ambulation;Comfort height toilet;RW   Toileting- Clothing Manipulation and Hygiene: Supervision/safety;Sit to/from stand         General ADL Comments: Reviewed home safety, fall prevention, and strengthening exercises for UB using therabband which pt has at home     Vision Vision Assessment?: Yes Eye Alignment: Within Functional Limits Ocular Range of Motion: Within Functional Limits Alignment/Gaze Preference: Within Defined Limits Tracking/Visual Pursuits: Decreased smoothness of horizontal tracking;Decreased smoothness of vertical tracking Saccades: Within functional limits Convergence: Within functional limits Visual Fields: No apparent deficits   Perception     Praxis      Pertinent Vitals/Pain Pain Assessment: No/denies pain     Hand Dominance Right   Extremity/Trunk Assessment Upper Extremity Assessment Upper Extremity Assessment: RUE deficits/detail;LUE deficits/detail RUE Deficits / Details: STRENGTH: shoulder flexion/extension 3+/5, elbow flexion/extension 4-/5, composite hand flexion/extension 4-/5; AROM: wfl; SENSATION: intact LUE Deficits / Details: STRENGTH: shoulder flexion/extension 3/5, elbow flexion/extension 4-/5, composite hand flexion/extension 4-/5; AROM: wfl; SENSATION: intact  Lower Extremity Assessment Lower Extremity Assessment: Generalized weakness   Cervical / Trunk Assessment Cervical / Trunk Assessment: Kyphotic   Communication Communication Communication: No difficulties   Cognition Arousal/Alertness: Awake/alert Behavior During Therapy: WFL for tasks assessed/performed Overall Cognitive Status: Impaired/Different from baseline Area of Impairment: Orientation;Problem solving;Memory Orientation Level: Disoriented to;Time   Memory: Decreased short-term memory       Problem Solving: Slow processing General Comments: Cues to recall month, pt stated it was 1972 and was unable to recall year 2017 when told x3 and stated "It's 2003 right?" Pt's wife reports that this is his baseline due to his fatty liver disease. He apparently has moments of memory lapses.   General Comments       Exercises       Shoulder Instructions      Home Living Family/patient expects to be discharged to:: Private residence Living Arrangements: Spouse/significant other Available Help at Discharge: Family;Available 24 hours/day Type of Home: House Home Access: Stairs to enter [employer] of Steps: 1 Entrance Stairs-Rails: None Home Layout: One level     Bathroom Shower/Tub: Walk-in shower;Tub/shower unit Shower/tub characteristics: Curtain Biochemist, clinical: Handicapped height     Home Equipment: [place] - single point;Tub bench;Hand held shower head          Prior Functioning/Environment          Comments: Uses [employer], 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 and the office is on his home property, does not drive    OT Diagnosis: Generalized weakness;Acute pain;Cognitive deficits   OT Problem List: Decreased strength;Decreased range of motion;Decreased activity tolerance;Impaired balance (sitting and/or standing);Decreased safety awareness;Decreased cognition;Decreased knowledge of use of DME or AE;Obesity   OT  Treatment/Interventions:      OT Goals(Current goals can be found in the care plan section) Acute Rehab OT Goals Patient Stated Goal: Get back to work OT Goal Formulation: With patient Time For Goal Achievement: 09/19/15 Potential to Achieve Goals: Good  OT Frequency:     Barriers to D/C:            Co-evaluation              End of Session Equipment Utilized During Treatment: Gait belt;Rolling walker Nurse Communication: Mobility status  Activity Tolerance: Patient tolerated treatment well Patient left: in chair;with call bell/phone within reach;with family/visitor present   Time: [id number] OT Time Calculation (min): 25 min Charges:  OT General Charges $OT Visit: 1 Procedure OT Evaluation $OT Eval Low Complexity: 1 Procedure OT Treatments $Self Care/Home Management : 8-22 mins G-Codes: OT G-codes **NOT FOR INPATIENT CLASS** Functional Assessment Tool Used: clinical judgement Functional Limitation: Self care Self Care Current Status [id number]): At least 1 percent but less than 20 percent impaired, limited or restricted Self Care Goal Status [id number]): At least 1 percent but less than 20 percent impaired, limited or restricted Self Care Discharge Status [phone]): At least 1 percent but less than 20 percent impaired, limited or restricted  Redmond Baseman, OTR/L Pager: [phone] 09/05/2015, 3:36 PM

## 2015-09-05 NOTE — Progress Notes (Signed)
Pt discharged home with family, by car, assessment stable, prescriptions reviewed,  all questions answered. IV removed. Walker equipment deliver and given to pts wife. Pt taken by wheelchair to exit. Time of discharge: 1551

## 2015-09-05 NOTE — Progress Notes (Signed)
Lab called and reported pt was positive for norovirus. md made aware. No change in discharge plans. Pt will be placed back on enteric precautions

## 2015-09-05 NOTE — Progress Notes (Signed)
Cm received call from MD requesting DME arrangement. CM called [facility] DME rep, Merry Proud to please deliver the rolling walker to room so pt can discharge.  No other CM needs were communicated.

## 2015-09-05 NOTE — Evaluation (Signed)
Physical Therapy Evaluation Patient Details Name: Peter Larson MRN: [id number] DOB: 1950-09-08 Today's Date: 09/05/2015   History of Present Illness  Patient was admitted on 09/03/2015, with complaint of nausea vomiting as well as diarrhea. Patient was found to have acute kidney injury as well as hepatitic encephalopathy.  Clinical Impression  Pt admitted with above diagnosis. Pt currently with functional limitations due to the deficits listed below (see PT Problem List). Demonstrates mild balance deficits with gait requiring close guard for safety. Will likely improve as his medical condition stabilizes. May benefit from [place] for additional support in the meantime. Peter Larson available for supervision/assist at home. Pt will benefit from skilled PT to increase their independence and safety with mobility to allow discharge to the venue listed below.       Follow Up Recommendations No PT follow up;Supervision/Assistance - 24 hour (Initially until cognitive deficits resolve)    Equipment Recommendations  Rolling walker with 5" wheels    Recommendations for Other Services OT consult     Precautions / Restrictions Precautions Precautions: Fall Restrictions Weight Bearing Restrictions: No      Mobility  Bed Mobility               General bed mobility comments: sitting in recliner  Transfers Overall transfer level: Needs assistance Equipment used: None Transfers: Sit to/from Stand Sit to Stand: Min guard         General transfer comment: Requires extra time, rocks for momentum. VC for safety. Did not require physical assistance. Mild sway upon standing.  Ambulation/Gait Ambulation/Gait assistance: Min guard Ambulation Distance (Feet): 90 Feet Assistive device: None Gait Pattern/deviations: Step-through pattern;Decreased stride length;Wide base of support;Drifts right/left Gait velocity: decreased Gait velocity interpretation: Below normal speed for age/gender General Gait  Details: Mildly unstable, close guard required for safety. Intermittently touching rail in hallway for support. VC for safety and awareness. Agrees he may benefit from RW temporarily. Difficulty ambulating with cognitive tasks.  Stairs            Wheelchair Mobility    Modified Rankin (Stroke Patients Only)       Balance Overall balance assessment: Needs assistance Sitting-balance support: No upper extremity supported;Feet supported Sitting balance-Leahy Scale: Good     Standing balance support: No upper extremity supported Standing balance-Leahy Scale: Fair                               Pertinent Vitals/Pain Pain Assessment: No/denies pain    Home Living Family/patient expects to be discharged to:: Private residence Living Arrangements: Spouse/significant other Available Help at Discharge: Family;Available 24 hours/day Type of Home: House Home Access: Stairs to enter Entrance Stairs-Rails: None Entrance Stairs-Number of Steps: 1 Home Layout: One level Home Equipment: Cane - single point;Tub bench      Prior Function Level of Independence: Independent         Comments: works as Chief Financial Officer, 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 Peter Larson states they have been practicing answering basic orientation questions    General Comments General comments (skin integrity, edema,  etc.): SpO2 95%, HR in 80s Mildly dyspneic    Exercises        Assessment/Plan    PT Assessment Patient needs continued PT services  PT Diagnosis Difficulty walking;Abnormality of gait;Generalized weakness;Altered mental status   PT Problem List Decreased strength;Decreased activity tolerance;Decreased balance;Decreased mobility;Decreased cognition;Obesity  PT Treatment Interventions DME instruction;Gait training;Stair training;Functional mobility training;Therapeutic activities;Therapeutic exercise;Balance training;Cognitive remediation;Patient/family education   PT Goals (Current goals can be found in the Care Plan section) Acute Rehab PT Goals Patient Stated Goal: Get back to work PT Goal Formulation: With patient/family Time For Goal Achievement: 09/19/15 Potential to Achieve Goals: Good    Frequency Min 3X/week   Barriers to discharge        Co-evaluation               End of Session Equipment Utilized During Treatment: Gait belt Activity Tolerance: Patient tolerated treatment well Patient left: in chair;with call bell/phone within reach;with family/visitor present Nurse Communication: Mobility status    Functional Assessment Tool Used: clinical observation Functional Limitation: Mobility: Walking and moving around Mobility: Walking and Moving Around Current Status [phone]): At least 1 percent but less than 20 percent impaired, limited or restricted Mobility: Walking and Moving Around Goal Status [phone]): At least 1 percent but less than 20 percent impaired, limited or restricted    Time: [id number] PT Time Calculation (min) (ACUTE ONLY): 14 min   Charges:   PT Evaluation $PT Eval Low Complexity: 1 Procedure     PT G Codes:   PT G-Codes **NOT FOR INPATIENT CLASS** Functional Assessment Tool Used: clinical observation Functional Limitation: Mobility: Walking and moving around Mobility: Walking and Moving Around Current Status [id number]): At least 1 percent but  less than 20 percent impaired, limited or restricted Mobility: Walking and Moving Around Goal Status [phone]): At least 1 percent but less than 20 percent impaired, limited or restricted    Ellouise Newer 09/05/2015, 9:16 AM Elayne Snare, [place]

## 2015-09-06 ENCOUNTER — Encounter (HOSPITAL_COMMUNITY): Payer: Self-pay | Admitting: Adult Health

## 2015-09-06 ENCOUNTER — Emergency Department (HOSPITAL_COMMUNITY): Payer: 59

## 2015-09-06 ENCOUNTER — Emergency Department (HOSPITAL_COMMUNITY)
Admission: EM | Admit: 2015-09-06 | Discharge: 2015-09-06 | Disposition: A | Discharge status: Home / Self Care | Payer: 59 | Attending: Emergency Medicine | Admitting: Emergency Medicine

## 2015-09-06 DIAGNOSIS — I129 Hypertensive chronic kidney disease with stage 1 through stage 4 chronic kidney disease, or unspecified chronic kidney disease: Secondary | ICD-10-CM | POA: Insufficient documentation

## 2015-09-06 DIAGNOSIS — R0602 Shortness of breath: Secondary | ICD-10-CM | POA: Diagnosis present

## 2015-09-06 DIAGNOSIS — Z86010 Personal history of colonic polyps: Secondary | ICD-10-CM | POA: Insufficient documentation

## 2015-09-06 DIAGNOSIS — E119 Type 2 diabetes mellitus without complications: Secondary | ICD-10-CM | POA: Diagnosis not present

## 2015-09-06 DIAGNOSIS — Z8669 Personal history of other diseases of the nervous system and sense organs: Secondary | ICD-10-CM | POA: Diagnosis not present

## 2015-09-06 DIAGNOSIS — Z8739 Personal history of other diseases of the musculoskeletal system and connective tissue: Secondary | ICD-10-CM | POA: Diagnosis not present

## 2015-09-06 DIAGNOSIS — Z85038 Personal history of other malignant neoplasm of large intestine: Secondary | ICD-10-CM | POA: Insufficient documentation

## 2015-09-06 DIAGNOSIS — Z862 Personal history of diseases of the blood and blood-forming organs and certain disorders involving the immune mechanism: Secondary | ICD-10-CM | POA: Insufficient documentation

## 2015-09-06 DIAGNOSIS — Z8709 Personal history of other diseases of the respiratory system: Secondary | ICD-10-CM | POA: Diagnosis not present

## 2015-09-06 DIAGNOSIS — I5032 Chronic diastolic (congestive) heart failure: Secondary | ICD-10-CM | POA: Diagnosis not present

## 2015-09-06 DIAGNOSIS — K7682 Hepatic encephalopathy: Secondary | ICD-10-CM

## 2015-09-06 DIAGNOSIS — N189 Chronic kidney disease, unspecified: Secondary | ICD-10-CM | POA: Diagnosis not present

## 2015-09-06 DIAGNOSIS — K7290 Hepatic failure, unspecified without coma: Secondary | ICD-10-CM | POA: Diagnosis not present

## 2015-09-06 LAB — CBC WITH DIFFERENTIAL/PLATELET
Basophils Absolute: 0 10*3/uL (ref 0.0–0.1)
Basophils Relative: 1 %
EOS ABS: 0.2 10*3/uL (ref 0.0–0.7)
Eosinophils Relative: 4 %
HCT: 34.5 % — ABNORMAL LOW (ref 39.0–52.0)
HEMOGLOBIN: 11.5 g/dL — AB (ref 13.0–17.0)
LYMPHS ABS: 0.8 10*3/uL (ref 0.7–4.0)
LYMPHS PCT: 18 %
MCH: 30.5 pg (ref 26.0–34.0)
MCHC: 33.3 g/dL (ref 30.0–36.0)
MCV: 91.5 fL (ref 78.0–100.0)
Monocytes Absolute: 1.3 10*3/uL — ABNORMAL HIGH (ref 0.1–1.0)
Monocytes Relative: 28 %
NEUTROS PCT: 50 %
Neutro Abs: 2.3 10*3/uL (ref 1.7–7.7)
PLATELETS: 86 10*3/uL — AB (ref 150–400)
RBC: 3.77 MIL/uL — AB (ref 4.22–5.81)
RDW: 14.7 % (ref 11.5–15.5)
WBC: 4.6 10*3/uL (ref 4.0–10.5)

## 2015-09-06 LAB — COMPREHENSIVE METABOLIC PANEL
ALT: 30 U/L (ref 17–63)
ANION GAP: 9 (ref 5–15)
AST: 62 U/L — ABNORMAL HIGH (ref 15–41)
Albumin: 2.6 g/dL — ABNORMAL LOW (ref 3.5–5.0)
Alkaline Phosphatase: 108 U/L (ref 38–126)
BUN: 32 mg/dL — ABNORMAL HIGH (ref 6–20)
CHLORIDE: 110 mmol/L (ref 101–111)
CO2: 17 mmol/L — ABNORMAL LOW (ref 22–32)
CREATININE: 1.29 mg/dL — AB (ref 0.61–1.24)
Calcium: 8.6 mg/dL — ABNORMAL LOW (ref 8.9–10.3)
GFR, EST NON AFRICAN AMERICAN: 57 mL/min — AB (ref >60)
Glucose, Bld: 113 mg/dL — ABNORMAL HIGH (ref 65–99)
POTASSIUM: 4.2 mmol/L (ref 3.5–5.1)
Sodium: 136 mmol/L (ref 135–145)
Total Bilirubin: 1.8 mg/dL — ABNORMAL HIGH (ref 0.3–1.2)
Total Protein: 5.7 g/dL — ABNORMAL LOW (ref 6.5–8.1)

## 2015-09-06 LAB — AMMONIA: AMMONIA: 122 umol/L — AB (ref 9–35)

## 2015-09-06 LAB — I-STAT CG4 LACTIC ACID, ED: LACTIC ACID, VENOUS: 1.6 mmol/L (ref 0.5–2.0)

## 2015-09-06 LAB — PROTIME-INR
INR: 1.5 — AB (ref 0.00–1.49)
PROTHROMBIN TIME: 18.2 s — AB (ref 11.6–15.2)

## 2015-09-06 LAB — BRAIN NATRIURETIC PEPTIDE: B Natriuretic Peptide: 36.1 pg/mL (ref 0.0–100.0)

## 2015-09-06 LAB — I-STAT TROPONIN, ED: Troponin i, poc: 0.01 ng/mL (ref 0.00–0.08)

## 2015-09-06 MED ORDER — LACTULOSE 10 GM/15ML PO SOLN
30.0000 g | Freq: Once | ORAL | Status: AC
  Administered 2015-09-06: 30 g via ORAL
  Filled 2015-09-06: qty 45

## 2015-09-06 NOTE — ED Notes (Signed)
Presents with SOB, pt was D/c from Hospital today at 4 pm admitted with Hepatic encephalopathy and a pleural effusion. Denies PAin, endorses difficulty breath. sats 94% RA, he is able to speak in full sentences. Breath sounds diminished bilaterally. He is alert and oriented.  He has labored respirations. +! Pedal edema.

## 2015-09-06 NOTE — Discharge Instructions (Signed)
Hepatic Encephalopathy Hepatic encephalopathy is a loss of brain function from advanced liver disease. The effects of the condition depend on the type of liver damage and how severe it is. In some cases, hepatic encephalopathy can be reversed. CAUSES The exact cause of hepatic encephalopathy is not known. RISK FACTORS You have a higher risk of getting this condition if your liver is damaged. When the liver is damaged harmful substances called toxins can build up in the body. Certain toxins, such as ammonia, can harm your brain. Conditions that can cause liver damage include:  An infection.  Dehydration.  Intestinal bleeding.  Drinking too much alcohol.  Taking certain medicines, including tranquilizers, water pills (diuretics), antidepressants, or sleeping pills. SIGNS AND SYMPTOMS Signs and symptoms may develop suddenly. Or, they may develop slowly and get worse gradually. Symptoms can range from mild to severe. Mild Hepatic Encephalopathy  Mild confusion.  Personality and mood changes.  Anxiety and agitation.  Drowsiness.  Loss of mental abilities.  Musty or sweet-smelling breath. Worsening or Severe Hepatic Encephalopathy  Slowed movement.  Slurred speech.  Extreme personality changes.  Disorientation.  Abnormal shaking or flapping of the hands.  Coma. DIAGNOSIS To make a diagnosis, your health care provider will do a physical exam. To rule out other causes of your signs and symptoms, he or she may order tests. You may have:  Blood tests. These may be done to check your ammonia level, measure how long it takes your blood to clot, and check for infection.  Liver function tests. These may be done to check how well your liver is working.  MRI and CT scans. These may be done to check for a brain disorder.  Electroencephalogram (EEG). This may be done to measure the electrical activity in your brain. TREATMENT The first step in treatment is identifying and  treating possible triggers. The next step is involves taking medicine to lower the level of toxins in the body and to prevent ammonia from building up. You may need to take:  Antibiotics to reduce the ammonia-producing bacteria in your gut.  Lactulose to help flush ammonia from the gut. HOME CARE INSTRUCTIONS Eating and Drinking  Follow a low-protein diet that includes plenty of fruits, vegetables, and whole grains, as directed by your health care provider. Ammonia is produced when you digest high-protein foods.  Work with a dietitian or with your health care provider to make sure you are getting the right balance of protein and minerals.  Drink enough fluids to keep your urine clear or pale yellow. Drinking plenty of water helps prevent constipation.  Do not drink alcohol or use illegal drugs. Medicines  Only take medicine as directed by your health care provider.  If you were prescribed an antibiotic medicine, finish it all even if you start to feel better.  Do not start any new medicines, including over-the-counter medicines, without first checking with your health care provider. SEEK MEDICAL CARE IF:  You have new symptoms.  Your symptoms change.  Your symptoms get worse.  You have a fever.  You are constipated.  You have persistent nausea, vomiting, or diarrhea. SEEK IMMEDIATE MEDICAL CARE IF:  You become very confused or drowsy.  You vomit blood or material that looks like coffee grounds.  Your stool is bloody or black or looks like tar.   This information is not intended to replace advice given to you by your health care provider. Make sure you discuss any questions you have with your health care provider.     Document Released: 08/01/2006 Document Revised: 06/12/2014 Document Reviewed: 01/07/2014 [employer] Interactive Patient Education 2016 [employer]  

## 2015-09-06 NOTE — Discharge Summary (Signed)
[facility] Discharge Summary   Patient: Peter Larson [id number]   PCP: [name], MD DOB: Nov 21, 1950   Date of admission: 09/03/2015   Date of discharge: 09/05/2015    Discharge Diagnoses:  Principal Problem:   Acute renal failure superimposed on stage 3 chronic kidney disease ([place]) Active Problems:   OSA (obstructive sleep apnea)   Essential hypertension, benign   CKD (chronic kidney disease), stage III   Morbid obesity (HCC)   Liver cirrhosis secondary to NASH   Hepatic encephalopathy (HCC)   Pleural effusion on right   Moderate mitral regurgitation   Diarrhea   Chronic diastolic CHF (congestive heart failure) ([place])  Recommendations for Outpatient Follow-up:  1. Follow-up with PCP in one week   Follow-up Information    Follow up with [name], MD. Schedule an appointment as soon as possible for a visit in 1 week.   Specialty:  Family Medicine   Contact information:   [place] [address] [phone]      Diet recommendation: Low-salt diet  Activity: The patient is advised to gradually reintroduce usual activities.  Discharge Condition: good  History of present illness: As per the H and P dictated on admission, "65 year old male patient known to me from previous admission H/P I did on 3/28. He has a history of hypertension, sleep apnea on CPAP, chronic diastolic heart failure related to moderate mitral regurgitation, chronic kidney disease stage III, [place] cirrhosis and morbid obesity. He was recently admitted because of hepatic encephalopathy and decompensated diastolic heart failure. During the hospitalization lactulose dosage was increased and he was briefly diuresed with IV Lasix. His heart failure was compensated and his mentation had returned to baseline and he was subsequently discharged when 4 hours later on 3/29.  Since discharge patient has taken his medications as prescribed and there were no changes in his  medications upon discharge. Wife reports that yesterday patient began having nausea and vomiting multiple episodes as well as watery diarrhea which is not consistent with the loose stools he has after taking lactulose. Since onset of symptoms patient has had poor appetite and poor oral intake primarily eating Jell-O and clear liquids. He's continued to take his lactulose and his diuretics and wife has noted urine output is the same but the urine is more dark in appearance. He has not had any abdominal pain, fevers or chills. Because of the symptoms the wife took him to see his primary care physician yesterday and labs were obtained and he was given a shot of Phenergan and discharged back to home. Wife reports that since that time patient has become more agitated and somewhat belligerent which is not typical for him even with his usual hepatic encephalopathy. Because of the symptoms they opted to present to the ER early this morning. Prior to coming to the ER patient was given his usual lactulose, Lasix and Aldactone."  Hospital Course:  Summary of his active problems in the hospital is as following. 1. Acute on chronic kidney disease stage III Likely from poor oral intake as well as excessive diarrhea. Renal function is appearing to be close to his baseline as well. Resume Lasix on discharge resume Aldactone on discharge. Reduce the frequency of lactulose from 4 times a day to 3 times a day and maintain only 2-3 loose watery bowel movement in a day.  Avoid nephrotoxic medication.  2. Nonalcoholic steatohepatitis. Cirrhosis of the liver. Hepatitic encephalopathy. Abdominal distention with suspected ascites. No abdominal tenderness  on examination. Ammonia level has been trending downwards. Prominent in asterixis With this I would resume lactulose 3 times a day Hold off for 3 loose soft bowel movement. At present without any abdominal tenderness and a GI bleed, I suspect the patient does not need  any SBP prophylaxis. Monitor for further evidence of bleeding. Continue rifaximin.  3. Chronic diastolic heart failure. Essential hypertension. Blood pressure remained stable. Patient is on Aldactone and Lasix at home. Initially both were on hold due to acute on chronic kidney damage.  Resume on discharge  4. Diarrhea. [place] virus positive. C. difficile negative  Reduce the dose of the lactulose. Continue precautions at home.  5. Chronic thrombocytopenia. Associated with the liver disease. Monitor for active bleeding.  6. Chronic anemia. And no evidence of active bleeding here in the hospital. Hemoglobin drop Likely from dilution. We will continue to monitor clinically   All other chronic medical condition were stable during the hospitalization.  Patient was seen by physical therapy, who recommended home health, which was arranged by Education officer, museum and case Freight forwarder. On the day of the discharge the patient's ammonia level significantly improved as well as his renal function stabilized, and no other acute medical condition were reported by patient. the patient was felt safe to be discharge at home with home health.  Procedures and Results:  none   Consultations:  none  DISCHARGE MEDICATION: Discharge Medication List as of 09/05/2015  3:31 PM    CONTINUE these medications which have CHANGED   Details  lactulose (CHRONULAC) 10 GM/15ML solution Take 45 mLs (30 g total) by mouth 3 (three) times daily., Starting 09/05/2015, Until Discontinued, Normal      CONTINUE these medications which have NOT CHANGED   Details  cholecalciferol (VITAMIN D) 1000 UNITS tablet Take 3,000 Units by mouth every morning. , Until Discontinued, Historical Med    esomeprazole (NEXIUM) 40 MG capsule Take 40 mg by mouth every evening. , Until Discontinued, Historical Med    famotidine (PEPCID) 20 MG tablet Take 1 tablet (20 mg total) by mouth as needed for heartburn or indigestion., Starting 02/24/2014,  Until Discontinued, OTC    furosemide (LASIX) 40 MG tablet Take 1 tablet (40 mg total) by mouth 2 (two) times daily., Starting 02/01/2014, Until Discontinued, Normal    hydrOXYzine (ATARAX/VISTARIL) 25 MG tablet Take 25 mg by mouth every evening. , Starting 08/26/2015, Until Discontinued, Historical Med    Multiple Vitamin (MULTIVITAMIN) tablet Take 1 tablet by mouth every morning. , Until Discontinued, Historical Med    rifaximin (XIFAXAN) 550 MG TABS tablet Take 1 tablet (550 mg total) by mouth 2 (two) times daily., Starting 02/13/2014, Until Discontinued, Print    simethicone (MYLICON) 80 MG chewable tablet Chew 1 tablet (80 mg total) by mouth 2 (two) times daily., Starting 07/03/2015, Until Discontinued, No Print    spironolactone (ALDACTONE) 50 MG tablet Take 1 tablet (50 mg total) by mouth 2 (two) times daily., Starting 02/01/2014, Until Discontinued, Normal    zinc sulfate 220 MG capsule Take 220 mg by mouth daily., Starting 08/17/2015, Until Wed 08/16/16, Historical Med      STOP taking these medications     milk thistle 175 MG tablet        No Known Allergies Discharge Instructions    Diet - low sodium heart healthy    Complete by:  As directed      Discharge instructions    Complete by:  As directed   It is important that you  read following instructions as well as go over your medication list with RN to help you understand your care after this hospitalization.  Discharge Instructions: Please follow-up with PCP in one week  Please request your primary care physician to go over all Hospital Tests and Procedure/Radiological results at the follow up,  Please get all Hospital records sent to your PCP by signing hospital release before you go home.   Do not drive, operating heavy machinery, perform activities at heights, swimming or participation in water activities or provide baby sitting services; until you have been seen by Primary Care Physician or a Neurologist and advised to do  so again. Do not take more than prescribed Pain, Sleep and Anxiety Medications. You were cared for by a hospitalist during your hospital stay. If you have any questions about your discharge medications or the care you received while you were in the hospital after you are discharged, you can call the unit and ask to speak with the hospitalist on call if the hospitalist that took care of you is not available.  Once you are discharged, your primary care physician will handle any further medical issues. Please note that NO REFILLS for any discharge medications will be authorized once you are discharged, as it is imperative that you return to your primary care physician (or establish a relationship with a primary care physician if you do not have one) for your aftercare needs so that they can reassess your need for medications and monitor your lab values. You Must read complete instructions/literature along with all the possible adverse reactions/side effects for all the Medicines you take and that have been prescribed to you. Take any new Medicines after you have completely understood and accept all the possible adverse reactions/side effects. Wear Seat belts while driving.     Increase activity slowly    Complete by:  As directed           Discharge Exam: Filed Weights   09/03/15 1246 09/05/15 0436  Weight: 144.3 kg (318 lb 2 oz) 145.3 kg (320 lb 5.3 oz)   Filed Vitals:   09/05/15 0916 09/05/15 1326  BP: 118/47 105/61  Pulse: 76 89  Temp: 98.4 F (36.9 C) 99.2 F (37.3 C)  Resp: 18 18   General: Appear in mild distress, no Rash; Oral Mucosa moist. Cardiovascular: S1 and S2 Present, no Murmur, no JVD Respiratory: Bilateral Air entry present and Clear to Auscultation, no Crackles, no wheezes Abdomen: Bowel Sound present, Soft and distended, no tenderness Extremities: bilateal Pedal edema, no calf tenderness Neurology: Grossly no focal neuro deficit.  The results of significant  diagnostics from this hospitalization (including imaging, microbiology, ancillary and laboratory) are listed below for reference.    Significant Diagnostic Studies: Dg Chest 2 View  09/06/2015  CLINICAL DATA:  Shortness of breath for 2 days EXAM: CHEST  2 VIEW COMPARISON:  09/03/2015 FINDINGS: Cardiac shadow is stable. Persistent small right pleural effusion and right basilar infiltrate is again seen. The left lung remains clear. No bony abnormality is noted. IMPRESSION: Stable changes in the right base. Electronically Signed   By: Inez Catalina M.D.   On: 09/06/2015 01:16   Dg Chest 2 View  09/03/2015  CLINICAL DATA:  Altered mental status EXAM: CHEST  2 VIEW COMPARISON:  08/31/2015 FINDINGS: Heart is normal size. Focal airspace opacity in the right lower lobe with layering effusion. No confluent opacity on the left. No acute bony abnormality. IMPRESSION: Layering right pleural effusion with right lower  lobe atelectasis or infiltrate. Electronically Signed   By: Rolm Baptise M.D.   On: 09/03/2015 10:32   Dg Chest 2 View  08/31/2015  CLINICAL DATA:  Acute onset of shortness of breath and generalized weakness. Initial encounter. EXAM: CHEST  2 VIEW COMPARISON:  Chest radiograph performed 06/30/2015 FINDINGS: An increasing small to moderate right-sided pleural effusion is noted. Underlying vascular congestion is seen. Bibasilar airspace opacities may reflect mild pulmonary edema. No pneumothorax is seen. The heart is borderline normal in size. No acute osseous abnormalities identified. IMPRESSION: Increasing small to moderate right-sided pleural effusion noted. Vascular congestion seen. Bibasilar airspace opacities may reflect mild pulmonary edema. Electronically Signed   By: Garald Balding M.D.   On: 08/31/2015 01:05    Microbiology: Recent Results (from the past 240 hour(s))  Culture, blood (Routine X 2) w Reflex to ID Panel     Status: None (Preliminary result)   Collection Time: 09/03/15 10:07 AM    Result Value Ref Range Status   Specimen Description BLOOD LEFT ANTECUBITAL  Final   Special Requests BOTTLES DRAWN AEROBIC ONLY 3CC  Final   Culture NO GROWTH 2 DAYS  Final   Report Status PENDING  Incomplete  Culture, blood (Routine X 2) w Reflex to ID Panel     Status: None (Preliminary result)   Collection Time: 09/03/15 12:21 PM  Result Value Ref Range Status   Specimen Description BLOOD RIGHT ANTECUBITAL  Final   Special Requests BOTTLES DRAWN AEROBIC AND ANAEROBIC 5CC  Final   Culture NO GROWTH 2 DAYS  Final   Report Status PENDING  Incomplete  Urine culture     Status: None   Collection Time: 09/03/15  8:59 PM  Result Value Ref Range Status   Specimen Description URINE, RANDOM  Final   Special Requests NONE  Final   Culture MULTIPLE SPECIES PRESENT, SUGGEST RECOLLECTION  Final   Report Status 09/05/2015 FINAL  Final  Gastrointestinal Panel by PCR , Stool     Status: Abnormal   Collection Time: 09/04/15 11:42 AM  Result Value Ref Range Status   Campylobacter species NOT DETECTED NOT DETECTED Final   Plesimonas shigelloides NOT DETECTED NOT DETECTED Final   Salmonella species NOT DETECTED NOT DETECTED Final   Yersinia enterocolitica NOT DETECTED NOT DETECTED Final   Vibrio species NOT DETECTED NOT DETECTED Final   Vibrio cholerae NOT DETECTED NOT DETECTED Final   Enteroaggregative E coli (EAEC) NOT DETECTED NOT DETECTED Final   Enteropathogenic E coli (EPEC) NOT DETECTED NOT DETECTED Final   Enterotoxigenic E coli (ETEC) NOT DETECTED NOT DETECTED Final   Shiga like toxin producing E coli (STEC) NOT DETECTED NOT DETECTED Final   E. coli O157 NOT DETECTED NOT DETECTED Final   Shigella/Enteroinvasive E coli (EIEC) NOT DETECTED NOT DETECTED Final   Cryptosporidium NOT DETECTED NOT DETECTED Final   Cyclospora cayetanensis NOT DETECTED NOT DETECTED Final   Entamoeba histolytica NOT DETECTED NOT DETECTED Final   Giardia lamblia NOT DETECTED NOT DETECTED Final   Adenovirus  F40/41 NOT DETECTED NOT DETECTED Final   Astrovirus NOT DETECTED NOT DETECTED Final   Norovirus GI/GII DETECTED (A) NOT DETECTED Final    Comment: CRITICAL RESULT CALLED TO, READ BACK BY AND VERIFIED WITH: Orbie Pyo @ 1417 09/05/15 by [place]    Rotavirus A NOT DETECTED NOT DETECTED Final   Sapovirus (I, II, IV, and V) NOT DETECTED NOT DETECTED Final  C difficile quick scan w PCR reflex     Status: None  Collection Time: 09/04/15 11:42 AM  Result Value Ref Range Status   C Diff antigen NEGATIVE NEGATIVE Final   C Diff toxin NEGATIVE NEGATIVE Final   C Diff interpretation Negative for toxigenic C. difficile  Final     Labs: CBC:  Recent Labs Lab 08/31/15 0021 09/01/15 0321 09/03/15 1007 09/04/15 0416 09/05/15 0852 09/06/15 0030  WBC 4.5 3.8* 5.5 4.4 4.3 4.6  NEUTROABS 2.4  --  3.9  --  1.9 2.3  HGB 11.0* 10.9* 12.0* 10.4* 11.5* 11.5*  HCT 33.7* 32.7* 34.5* 31.5* 35.4* 34.5*  MCV 92.8 93.2 92.0 91.8 92.4 91.5  PLT 69* 68* 69* 67* 78* 86*   Basic Metabolic Panel:  Recent Labs Lab 08/31/15 0817 09/01/15 0321 09/03/15 1007 09/04/15 0416 09/05/15 0852 09/06/15 0030  NA  --  137 133* 134* 135 136  K  --  4.4 4.4 4.3 4.1 4.2  CL  --  108 106 108 108 110  CO2  --  21* 17* 19* 21* 17*  GLUCOSE  --  84 107* 74 91 113*  BUN  --  25* 42* 44* 37* 32*  CREATININE  --  1.50* 2.17* 1.73* 1.40* 1.29*  CALCIUM  --  8.9 8.9 8.6* 8.7* 8.6*  MG 1.9  --   --   --   --   --    Liver Function Tests:  Recent Labs Lab 09/01/15 0321 09/03/15 1007 09/04/15 0416 09/05/15 0852 09/06/15 0030  AST 37 42* 42* 50* 62*  ALT 21 25 24 28 30   ALKPHOS 86 84 77 98 108  BILITOT 2.5* 2.4* 1.8* 1.9* 1.8*  PROT 5.4* 5.8* 5.2* 6.0* 5.7*  ALBUMIN 2.5* 2.7* 2.4* 2.6* 2.6*   No results for input(s): LIPASE, AMYLASE in the last 168 hours.  Recent Labs Lab 09/03/15 1007 09/03/15 1537 09/04/15 0416 09/05/15 0852 09/06/15 0147  AMMONIA 201* 102* 168* 96* 122*   Cardiac Enzymes: No results  for input(s): CKTOTAL, CKMB, CKMBINDEX, TROPONINI in the last 168 hours. BNP (last 3 results)  Recent Labs  06/30/15 1948 07/01/15 0414 09/06/15 0030  BNP 80.9 89.0 36.1   CBG:  Recent Labs Lab 09/01/15 0610 09/01/15 0754 09/01/15 0830 09/01/15 1237 09/03/15 1206  GLUCAP 74 75 110* 92 81   Time spent: 30 minutes  Signed:  Roslynn Holte  [facility] 09/05/2015 , 10:26 AM

## 2015-09-06 NOTE — ED Provider Notes (Signed)
CSN: [id number]     Arrival date & time 09/06/15  0007 History  By signing my name below, I, Peter Larson, attest that this documentation has been prepared under the direction and in the presence of Peter Greek, MD. Electronically Signed: Doran Larson, ED Scribe. 09/06/2015. 1:40 AM.   Chief Complaint  Patient presents with  . Shortness of Breath   The history is provided by the patient. No language interpreter was used.   HPI Comments: Peter Larson is a 65 y.o. male who presents to the Emergency Department complaining of SOB that began this evening. Pt states he can not catch his breathe. He reports he felt fine when he was discharge from the hospital today but began having these symptoms after using a spirometer.  Pt denies any fever, unusual cough, chest pain, or any other symptoms at this time.   Wife states their heat pump broke and came home to a very hot house.  Past Medical History  Diagnosis Date  . Sleep apnea   . Splenomegaly     slt thrombocytopenia;no complics. egd Q000111Q neg for varices, ?mild portal gastropathy; 02/2011 nl,novarices  . 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 Hypertension   . Adenomatous colon polyp '01 & '08  . Hemorrhoids     Severe anorectal pain and anal fissure w bleeding following hemorrhoidectomy may 2010  . Family history of colon cancer     mother --16  . Seasonal allergies   . Screening     Hepatocellular Screening CT neg 05/2008, U/S neg 04/2009,05/2010  . Dysphagia     BaS/tab neg 8/09; egd's neg for ring/strict--?dysmotility  . Chronic diastolic congestive heart failure ([place])     a.  ECHO 01/13/14: EF 55-60%; moderate LVH, G2DD, biatrial enlargement; mild RVE; moderate MR; mildly elevated, pulm pressures.  . OSA (obstructive sleep apnea)   . Morbid obesity ([place])   . Frequent PVCs   . Pancytopenia ([place])   . 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  Marland Kitchen Shortness of breath   .  Arthritis     RA  " ALL OVER "  . Dilatation of aorta ([facility])     a. Mild aortic root dilitation by echo 01/13/14  . Chronic hepatitis ([place])     cirrhosis  . Hepatic encephalopathy ([place])   . CKD (chronic kidney disease)   . Pleural effusion 08/2015   Past Surgical History  Procedure Laterality Date  . Fissure surgery    . Hemorrhoid surgery    . Esophagogastroduodenoscopy (egd) with propofol N/A 03/11/2015    Procedure: ESOPHAGOGASTRODUODENOSCOPY (EGD) WITH PROPOFOL;  Surgeon: Ronald Lobo, MD;  Location: [facility];  Service: Endoscopy;  Laterality: N/A;  . Colonoscopy with propofol N/A 03/11/2015    Procedure: COLONOSCOPY WITH PROPOFOL;  Surgeon: Ronald Lobo, MD;  Location: [facility];  Service: Endoscopy;  Laterality: N/A;   Family History  Problem Relation Age of Onset  . Cancer Mother     died of breast cancer  . Liver disease Father   . Thyroid disease Sister    Social History  Substance Use Topics  . Smoking status: Never Smoker   . Smokeless tobacco: Never Used  . Alcohol Use: No     Comment: quit 2008    Review of Systems  Constitutional: Negative for fever.  Respiratory: Positive for shortness of breath. Negative for cough.   Cardiovascular: Negative for chest  pain.  All other systems reviewed and are negative.   Allergies  Review of patient's allergies indicates no known allergies.  Home Medications   Prior to Admission medications   Medication Sig Start Date End Date Taking? Authorizing Provider  cholecalciferol (VITAMIN D) 1000 UNITS tablet Take 3,000 Units by mouth every morning.    Yes Historical Provider, MD  esomeprazole (NEXIUM) 40 MG capsule Take 40 mg by mouth every evening.    Yes Historical Provider, MD  famotidine (PEPCID) 20 MG tablet Take 1 tablet (20 mg total) by mouth as needed for heartburn or indigestion. 02/24/14  Yes Kinnie Feil, MD  furosemide (LASIX) 40 MG tablet Take 1 tablet (40 mg total) by mouth 2 (two) times daily.  02/01/14  Yes Dayna N Dunn, PA-C  hydrOXYzine (ATARAX/VISTARIL) 25 MG tablet Take 25 mg by mouth every evening.  08/26/15  Yes Historical Provider, MD  lactulose (CHRONULAC) 10 GM/15ML solution Take 45 mLs (30 g total) by mouth 3 (three) times daily. 09/05/15  Yes Lavina Hamman, MD  Multiple Vitamin (MULTIVITAMIN) tablet Take 1 tablet by mouth every morning.    Yes Historical Provider, MD  rifaximin (XIFAXAN) 550 MG TABS tablet Take 1 tablet (550 mg total) by mouth 2 (two) times daily. 02/13/14  Yes Geradine Girt, DO  simethicone (MYLICON) 80 MG chewable tablet Chew 1 tablet (80 mg total) by mouth 2 (two) times daily. 07/03/15  Yes Modena Jansky, MD  spironolactone (ALDACTONE) 50 MG tablet Take 1 tablet (50 mg total) by mouth 2 (two) times daily. 02/01/14  Yes Dayna N Dunn, PA-C  zinc sulfate 220 MG capsule Take 220 mg by mouth daily. 08/17/15 08/16/16 Yes Historical Provider, MD   BP 122/60 mmHg  Pulse 92  Temp(Src) 99 F (37.2 C) (Oral)  Resp 17  SpO2 99% Physical Exam  Constitutional: He is oriented to person, place, and time. He appears well-developed and well-nourished. No distress.  HENT:  Head: Normocephalic and atraumatic.  Right Ear: Hearing normal.  Left Ear: Hearing normal.  Nose: Nose normal.  Mouth/Throat: Oropharynx is clear and moist and mucous membranes are normal.  Eyes: Conjunctivae and EOM are normal. Pupils are equal, round, and reactive to light.  Neck: Normal range of motion. Neck supple.  Cardiovascular: Regular rhythm, S1 normal and S2 normal.  Exam reveals no gallop and no friction rub.   No murmur heard. Pulmonary/Chest: Effort normal and breath sounds normal. No respiratory distress. He exhibits no tenderness.  Abdominal: Soft. Normal appearance and bowel sounds are normal. There is no hepatosplenomegaly. There is no tenderness. There is no rebound, no guarding, no tenderness at McBurney's point and negative Murphy's sign. No hernia.  Musculoskeletal: Normal range  of motion.  Chronic stasis of lower legs   Neurological: He is alert and oriented to person, place, and time. He has normal strength. No cranial nerve deficit or sensory deficit. Coordination normal. GCS eye subscore is 4. GCS verbal subscore is 5. GCS motor subscore is 6.  Skin: Skin is warm, dry and intact. No rash noted. No cyanosis.  Psychiatric: He has a normal mood and affect. His speech is normal and behavior is normal. Thought content normal.  Nursing note and vitals reviewed.   ED Course  Procedures   DIAGNOSTIC STUDIES: Oxygen Saturation is 97% on room air, normal by my interpretation.    COORDINATION OF CARE: 1:35 AM Will order blood work. Discussed treatment plan with pt at bedside and pt agreed to plan.  Labs Review Labs Reviewed  CBC WITH DIFFERENTIAL/PLATELET - Abnormal; Notable for the following:    RBC 3.77 (*)    Hemoglobin 11.5 (*)    HCT 34.5 (*)    Platelets 86 (*)    Monocytes Absolute 1.3 (*)    All other components within normal limits  COMPREHENSIVE METABOLIC PANEL - Abnormal; Notable for the following:    CO2 17 (*)    Glucose, Bld 113 (*)    BUN 32 (*)    Creatinine, Ser 1.29 (*)    Calcium 8.6 (*)    Total Protein 5.7 (*)    Albumin 2.6 (*)    AST 62 (*)    Total Bilirubin 1.8 (*)    GFR calc non Af Amer 57 (*)    All other components within normal limits  AMMONIA - Abnormal; Notable for the following:    Ammonia 122 (*)    All other components within normal limits  PROTIME-INR - Abnormal; Notable for the following:    Prothrombin Time 18.2 (*)    INR 1.50 (*)    All other components within normal limits  BRAIN NATRIURETIC PEPTIDE  I-STAT TROPOININ, ED  I-STAT CG4 LACTIC ACID, ED    Imaging Review Dg Chest 2 View  09/06/2015  CLINICAL DATA:  Shortness of breath for 2 days EXAM: CHEST  2 VIEW COMPARISON:  09/03/2015 FINDINGS: Cardiac shadow is stable. Persistent small right pleural effusion and right basilar infiltrate is again seen. The  left lung remains clear. No bony abnormality is noted. IMPRESSION: Stable changes in the right base. Electronically Signed   By: Inez Catalina M.D.   On: 09/06/2015 01:16   I have personally reviewed and evaluated these images and lab results as part of my medical decision-making.   EKG Interpretation   Date/Time:  Monday September 06 2015 00:16:31 EDT Ventricular Rate:  96 PR Interval:  198 QRS Duration: 86 QT Interval:  356 QTC Calculation: 449 R Axis:   -13 Text Interpretation:  Sinus rhythm with marked sinus arrhythmia with  occasional Premature ventricular complexes Septal infarct , age  undetermined Nonspecific ST abnormality No significant change since last  tracing Confirmed by POLLINA  MD, [place] [phone]) on 09/06/2015 12:27:24  AM      MDM   Final diagnoses:  Hepatic encephalopathy ([place])    Patient presents to the emergency department for evaluation of shortness of breath. Patient reports that he felt like he was started having shortness of breath after he attempted to use his incentive spirometer at home. Upon arrival to the ER, however, he appears well. There is no hypoxia. He is breathing comfortably. Lungs are clear and chest x-ray was clear. His workup was largely unremarkable other than elevated ammonia. It appears that his baseline currently is around 100. He is 122 today and likely some of his symptoms are secondary to this. He was given additional lactulose. Patient appears well, however, and appropriate for discharge and follow-up with his gastroenterologist.  I personally performed the services described in this documentation, which was scribed in my presence. The recorded information has been reviewed and is accurate.     Peter Greek, MD 09/06/15 [phone]

## 2015-09-08 LAB — CULTURE, BLOOD (ROUTINE X 2)
Culture: NO GROWTH
Culture: NO GROWTH

## 2015-11-03 ENCOUNTER — Emergency Department (HOSPITAL_COMMUNITY)
Admission: EM | Admit: 2015-11-03 | Discharge: 2015-11-03 | Disposition: A | Discharge status: Home / Self Care | Payer: 59 | Attending: Emergency Medicine | Admitting: Emergency Medicine

## 2015-11-03 ENCOUNTER — Encounter (HOSPITAL_COMMUNITY): Payer: Self-pay

## 2015-11-03 ENCOUNTER — Emergency Department (HOSPITAL_COMMUNITY): Payer: 59

## 2015-11-03 DIAGNOSIS — K921 Melena: Secondary | ICD-10-CM | POA: Insufficient documentation

## 2015-11-03 DIAGNOSIS — K6289 Other specified diseases of anus and rectum: Secondary | ICD-10-CM | POA: Insufficient documentation

## 2015-11-03 DIAGNOSIS — E7220 Disorder of urea cycle metabolism, unspecified: Secondary | ICD-10-CM

## 2015-11-03 DIAGNOSIS — W19XXXA Unspecified fall, initial encounter: Secondary | ICD-10-CM

## 2015-11-03 DIAGNOSIS — E875 Hyperkalemia: Secondary | ICD-10-CM

## 2015-11-03 DIAGNOSIS — K625 Hemorrhage of anus and rectum: Secondary | ICD-10-CM | POA: Diagnosis present

## 2015-11-03 DIAGNOSIS — M25512 Pain in left shoulder: Secondary | ICD-10-CM

## 2015-11-03 LAB — AMMONIA: Ammonia: 100 umol/L — ABNORMAL HIGH (ref 9–35)

## 2015-11-03 LAB — PROTIME-INR
INR: 1.61 — AB (ref 0.00–1.49)
PROTHROMBIN TIME: 19.1 s — AB (ref 11.6–15.2)

## 2015-11-03 LAB — COMPREHENSIVE METABOLIC PANEL
ALT: 32 U/L (ref 17–63)
ANION GAP: 5 (ref 5–15)
AST: 50 U/L — ABNORMAL HIGH (ref 15–41)
Albumin: 2.7 g/dL — ABNORMAL LOW (ref 3.5–5.0)
Alkaline Phosphatase: 121 U/L (ref 38–126)
BUN: 33 mg/dL — ABNORMAL HIGH (ref 6–20)
CHLORIDE: 103 mmol/L (ref 101–111)
CO2: 23 mmol/L (ref 22–32)
Calcium: 8.6 mg/dL — ABNORMAL LOW (ref 8.9–10.3)
Creatinine, Ser: 1.82 mg/dL — ABNORMAL HIGH (ref 0.61–1.24)
GFR, EST AFRICAN AMERICAN: 44 mL/min — AB (ref >60)
GFR, EST NON AFRICAN AMERICAN: 38 mL/min — AB (ref >60)
Glucose, Bld: 76 mg/dL (ref 65–99)
POTASSIUM: 5.3 mmol/L — AB (ref 3.5–5.1)
SODIUM: 131 mmol/L — AB (ref 135–145)
Total Bilirubin: 2.4 mg/dL — ABNORMAL HIGH (ref 0.3–1.2)
Total Protein: 5.9 g/dL — ABNORMAL LOW (ref 6.5–8.1)

## 2015-11-03 LAB — CBC WITH DIFFERENTIAL/PLATELET
BASOS PCT: 0 %
Basophils Absolute: 0 10*3/uL (ref 0.0–0.1)
Eosinophils Absolute: 0.1 10*3/uL (ref 0.0–0.7)
Eosinophils Relative: 1 %
HEMATOCRIT: 35.6 % — AB (ref 39.0–52.0)
HEMOGLOBIN: 11.7 g/dL — AB (ref 13.0–17.0)
Lymphocytes Relative: 21 %
Lymphs Abs: 1 10*3/uL (ref 0.7–4.0)
MCH: 30.3 pg (ref 26.0–34.0)
MCHC: 32.9 g/dL (ref 30.0–36.0)
MCV: 92.2 fL (ref 78.0–100.0)
MONOS PCT: 19 %
Monocytes Absolute: 0.9 10*3/uL (ref 0.1–1.0)
NEUTROS ABS: 2.8 10*3/uL (ref 1.7–7.7)
NEUTROS PCT: 59 %
Platelets: 100 10*3/uL — ABNORMAL LOW (ref 150–400)
RBC: 3.86 MIL/uL — AB (ref 4.22–5.81)
RDW: 15 % (ref 11.5–15.5)
WBC: 4.7 10*3/uL (ref 4.0–10.5)

## 2015-11-03 NOTE — Discharge Instructions (Signed)
Keep your scheduled appointment with your primary care provider. Return to ER for any new or worsening symptoms, any additional concerns.

## 2015-11-03 NOTE — ED Provider Notes (Signed)
CSN: [id number]     Arrival date & time 11/03/15  1526 History   First MD Initiated Contact with Patient 11/03/15 1526     Chief Complaint  Patient presents with  . Fall    (Consider location/radiation/quality/duration/timing/severity/associated sxs/prior Treatment) Patient is a 65 y.o. male presenting with fall. The history is provided by the patient and medical records. No language interpreter was used.  Fall Associated symptoms include weakness. Pertinent negatives include no abdominal pain, chills, congestion, coughing, fever, headaches, nausea, neck pain or vomiting.   Peter Larson is a 65 y.o. male  with a PMH of cirrhosis, HTN, DM, CKD who presents to the Emergency Department by EMS for mechanical fall Just prior to arrival today. Patient states he was walking outside on small pea gravel and grass when he stepped on a large patch of grass causing him to fall backwards. He hit his buttocks, then his head. Mild posterior headache that is worse with palpation. Denies loss of consciousness, no nausea or vomiting. Wife at bedside states no change in mental status. No disorientation, confusion, slurred speech. Patient states he had a mechanical fall on Sunday as well where he hit his shoulder. He has been experiencing mild pain to the shoulder. No medications taken for symptoms. Patient and wife at bedside agree that he has become weaker over the last month. PCP and GI doctors have told him this is often a natural progression of his chronic liver disease, however they are concerned about his safety now having 2 falls this week - they came to be evaluated today to make sure his ammonia was not too high, leading to these falls.   Past Medical History  Diagnosis Date  . Sleep apnea   . Splenomegaly     slt thrombocytopenia;no complics. egd Q000111Q neg for varices, ?mild portal gastropathy; 02/2011 nl,novarices  . 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 Hypertension   .  Adenomatous colon polyp '01 & '08  . Hemorrhoids     Severe anorectal pain and anal fissure w bleeding following hemorrhoidectomy may 2010  . Family history of colon cancer     mother --33  . Seasonal allergies   . Screening     Hepatocellular Screening CT neg 05/2008, U/S neg 04/2009,05/2010  . Dysphagia     BaS/tab neg 8/09; egd's neg for ring/strict--?dysmotility  . Chronic diastolic congestive heart failure ([place])     a.  ECHO 01/13/14: EF 55-60%; moderate LVH, G2DD, biatrial enlargement; mild RVE; moderate MR; mildly elevated, pulm pressures.  . OSA (obstructive sleep apnea)   . Morbid obesity ([place])   . Frequent PVCs   . Pancytopenia ([place])   . 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  Marland Kitchen Shortness of breath   . Arthritis     RA  " ALL OVER "  . Dilatation of aorta ([facility])     a. Mild aortic root dilitation by echo 01/13/14  . Chronic hepatitis ([place])     cirrhosis  . Hepatic encephalopathy ([place])   . CKD (chronic kidney disease)   . Pleural effusion 08/2015   Past Surgical History  Procedure Laterality Date  . Fissure surgery    . Hemorrhoid surgery    . Esophagogastroduodenoscopy (egd) with propofol N/A 03/11/2015    Procedure: ESOPHAGOGASTRODUODENOSCOPY (EGD) WITH PROPOFOL;  Surgeon: Ronald Lobo, MD;  [facility];  Service: Endoscopy;  Laterality: N/A;  . Colonoscopy  with propofol N/A 03/11/2015    Procedure: COLONOSCOPY WITH PROPOFOL;  Surgeon: Ronald Lobo, MD;  [facility];  Service: Endoscopy;  Laterality: N/A;   Family History  Problem Relation Age of Onset  . Cancer Mother     died of breast cancer  . Liver disease Father   . Thyroid disease Sister    Social History  Substance Use Topics  . Smoking status: Never Smoker   . Smokeless tobacco: Never Used  . Alcohol Use: No     Comment: quit 2008    Review of Systems  Constitutional: Negative for fever and chills.  HENT: Negative for congestion.   Eyes:  Negative for visual disturbance.  Respiratory: Negative for cough, shortness of breath and wheezing.   Cardiovascular: Negative.   Gastrointestinal: Positive for diarrhea (Chronic on lactulose). Negative for nausea, vomiting and abdominal pain.  Genitourinary: Negative for dysuria.  Musculoskeletal: Negative for back pain and neck pain.  Skin: Positive for wound (Bruising left shoulder).  Neurological: Positive for weakness. Negative for dizziness and headaches.      Allergies  Review of patient's allergies indicates no known allergies.  Home Medications   Prior to Admission medications   Medication Sig Start Date End Date Taking? Authorizing Provider  cholecalciferol (VITAMIN D) 1000 UNITS tablet Take 3,000 Units by mouth every morning.    Yes Historical Provider, MD  citalopram (CELEXA) 10 MG tablet Take 1 tablet by mouth daily. 09/30/15 09/29/16 Yes Historical Provider, MD  esomeprazole (NEXIUM) 40 MG capsule Take 40 mg by mouth every evening.    Yes Historical Provider, MD  famotidine (PEPCID) 20 MG tablet Take 1 tablet (20 mg total) by mouth as needed for heartburn or indigestion. 02/24/14  Yes Kinnie Feil, MD  furosemide (LASIX) 40 MG tablet Take 1 tablet (40 mg total) by mouth 2 (two) times daily. Patient taking differently: Take 40 mg by mouth daily. Alternate taking 40 and 60 mg every other day. 02/01/14  Yes Dayna N Dunn, PA-C  hydrOXYzine (ATARAX/VISTARIL) 25 MG tablet Take 25 mg by mouth every evening.  08/26/15  Yes Historical Provider, MD  lactulose (CHRONULAC) 10 GM/15ML solution Take 45 mLs (30 g total) by mouth 3 (three) times daily. 09/05/15  Yes Lavina Hamman, MD  metoprolol succinate (TOPROL-XL) 25 MG 24 hr tablet Take 1 tablet by mouth at bedtime. 10/27/15 10/26/16 Yes Historical Provider, MD  Multiple Vitamin (MULTIVITAMIN) tablet Take 1 tablet by mouth every morning.    Yes Historical Provider, MD  rifaximin (XIFAXAN) 550 MG TABS tablet Take 1 tablet (550 mg total) by  mouth 2 (two) times daily. 02/13/14  Yes Geradine Girt, DO  simethicone (MYLICON) 80 MG chewable tablet Chew 1 tablet (80 mg total) by mouth 2 (two) times daily. 07/03/15  Yes Modena Jansky, MD  spironolactone (ALDACTONE) 50 MG tablet Take 1 tablet (50 mg total) by mouth 2 (two) times daily. Patient taking differently: Take 150 mg by mouth daily.  02/01/14  Yes Dayna N Dunn, PA-C  zinc sulfate 220 MG capsule Take 220 mg by mouth daily. 08/17/15 08/16/16 Yes Historical Provider, MD   BP 115/52 mmHg  Pulse 63  Resp 17  SpO2 99% Physical Exam  Constitutional: He is oriented to person, place, and time.  WDWN obese male in NAD.   HENT:  Head: Normocephalic and atraumatic. Head is without raccoon's eyes and without Battle's sign.  Right Ear: No hemotympanum.  Left Ear: No hemotympanum.  Nose: Nose normal.  Mild TTP of occiput with no overlying skin changes. No bruising or open wounds.   Neck:  No midline or paraspinal tenderness. Full range of motion without pain.  Cardiovascular: Normal rate, regular rhythm, normal heart sounds and intact distal pulses.  Exam reveals no gallop and no friction rub.   No murmur heard. Pulmonary/Chest: Effort normal and breath sounds normal. No respiratory distress. He has no wheezes. He has no rales. He exhibits no tenderness.  Abdominal: Soft. Bowel sounds are normal. He exhibits no distension and no mass. There is no tenderness. There is no rebound and no guarding.  Musculoskeletal:  No midline or paraspinal tenderness to palpation. Left shoulder with decreased range of motion which patient states has been present for the last 2 years 2/2 previous injury. Tenderness to palpation of posterior shoulder. Healing bruise over lateral left shoulder. Left upper extremity with 2+ radial pulse.   Neurological: He is alert and oriented to person, place, and time.  Alert, oriented, thought content appropriate, able to give a coherent history. Speech is clear and goal  oriented, able to follow commands.  Cranial Nerves II-XII grossly intact. 5/5 muscle strength in upper and lower extremities bilaterally including strong and equal grip strength and dorsiflexion/plantar flexion. Sensory to light touch normal in all four extremities.  Normal finger-to-nose and rapid alternating movements.  Skin: Skin is warm and dry.  Nursing note and vitals reviewed.   ED Course  Procedures (including critical care time) Labs Review Labs Reviewed  COMPREHENSIVE METABOLIC PANEL - Abnormal; Notable for the following:    Sodium 131 (*)    Potassium 5.3 (*)    BUN 33 (*)    Creatinine, Ser 1.82 (*)    Calcium 8.6 (*)    Total Protein 5.9 (*)    Albumin 2.7 (*)    AST 50 (*)    Total Bilirubin 2.4 (*)    GFR calc non Af Amer 38 (*)    GFR calc Af Amer 44 (*)    All other components within normal limits  CBC WITH DIFFERENTIAL/PLATELET - Abnormal; Notable for the following:    RBC 3.86 (*)    Hemoglobin 11.7 (*)    HCT 35.6 (*)    Platelets 100 (*)    All other components within normal limits  AMMONIA - Abnormal; Notable for the following:    Ammonia 100 (*)    All other components within normal limits  PROTIME-INR - Abnormal; Notable for the following:    Prothrombin Time 19.1 (*)    INR 1.61 (*)    All other components within normal limits    Imaging Review Dg Shoulder Left  11/03/2015  CLINICAL DATA:  Multiple falls with left shoulder pain and bruising. EXAM: LEFT SHOULDER - 2+ VIEW COMPARISON:  Chest radiograph 09/06/2015 FINDINGS: Left shoulder is located. Areas of lucency in the left humeral head without definite fracture. Left AC joint is intact. Visualized left ribs are intact. IMPRESSION: No acute abnormality in left shoulder. Electronically Signed   By: Markus Daft M.D.   On: 11/03/2015 17:08   I have personally reviewed and evaluated these images and lab results as part of my medical decision-making.   EKG Interpretation   Date/Time:  Wednesday  Nov 03 2015 18:57:20 EDT Ventricular Rate:  61 PR Interval:  221 QRS Duration: 102 QT Interval:  412 QTC Calculation: 415 R Axis:   43 Text Interpretation:  Sinus rhythm Prolonged PR interval Anteroseptal  infarct, age indeterminate Confirmed by [facility]  MD, Almyra Free [phone]) on  11/03/2015 6:59:23 PM      MDM   Final diagnoses:  Left shoulder pain  Serum ammonia increased ([place])  Fall, initial encounter  Serum potassium elevated   Peter Larson presents to ER after a mechanical fall today, also had fall on Sunday. No mental status changes, no n/v, Exam with atraumatic head. 0 on [place] CT head rule. Patient and wife agree with plan for no head imaging. Has been feeling weak lately. Patient is closely followed by primary care, GI, and cardiology. GI and PCP have informed him that weakness will be a natural progression of his disease process. Their main concern at this time is that the weakness may be secondary to increased ammonia level given his hx of NASH cirrhosis and would like to have blood work checked while here. We will also image left shoulder. Left upper extremity is neurovascularly intact and all compartments soft.  Labs: Ammonia and PT/INR elevated but at baseline. CBC reassuring. CMP with k+ of 5.3, all other values at baseline.  Imaging: DG shoulder with no abnormalities.  EKG reviewed. Orthostatics normal. Able to ambulate at baseline per patient and wife.   A&P: Has PCP appointment on Monday which is an appropriate timeframe for follow up. Continue home meds. Return precautions given and all questions answered.     [facility] Abelino Tippin, PA-C 11/03/15 1917  Isla Pence, MD 11/03/15 [id number]  Isla Pence, MD 11/03/15 [phone]

## 2015-11-03 NOTE — ED Notes (Addendum)
Pt. BIB [facility] for evaluation of fall today. Pt. Reports mechanical fall. Pt. Also fell on Sunday. Pt. AxO x4, pt. Has chronic diarrhea from lactulose, hx of liver disease. Pt. States increased weakness over the last month, per pt. PCP told him this was normal progression of liver disease.

## 2015-11-16 ENCOUNTER — Encounter (HOSPITAL_COMMUNITY): Payer: Self-pay | Admitting: Emergency Medicine

## 2015-11-16 ENCOUNTER — Emergency Department (HOSPITAL_COMMUNITY): Payer: 59

## 2015-11-16 ENCOUNTER — Inpatient Hospital Stay (HOSPITAL_COMMUNITY): Payer: 59

## 2015-11-16 ENCOUNTER — Inpatient Hospital Stay (HOSPITAL_COMMUNITY)
Admission: EM | Admit: 2015-11-16 | Discharge: 2015-11-21 | DRG: 442 | Disposition: A | Discharge status: Skilled Nursing Facility | Payer: 59 | Attending: Internal Medicine | Admitting: Internal Medicine

## 2015-11-16 DIAGNOSIS — N189 Chronic kidney disease, unspecified: Secondary | ICD-10-CM

## 2015-11-16 DIAGNOSIS — R627 Adult failure to thrive: Secondary | ICD-10-CM | POA: Diagnosis present

## 2015-11-16 DIAGNOSIS — E875 Hyperkalemia: Secondary | ICD-10-CM

## 2015-11-16 DIAGNOSIS — K7581 Nonalcoholic steatohepatitis (NASH): Secondary | ICD-10-CM | POA: Diagnosis present

## 2015-11-16 DIAGNOSIS — G9340 Encephalopathy, unspecified: Secondary | ICD-10-CM | POA: Insufficient documentation

## 2015-11-16 DIAGNOSIS — E1122 Type 2 diabetes mellitus with diabetic chronic kidney disease: Secondary | ICD-10-CM | POA: Diagnosis present

## 2015-11-16 DIAGNOSIS — R001 Bradycardia, unspecified: Secondary | ICD-10-CM | POA: Diagnosis present

## 2015-11-16 DIAGNOSIS — K7290 Hepatic failure, unspecified without coma: Secondary | ICD-10-CM | POA: Diagnosis present

## 2015-11-16 DIAGNOSIS — K7460 Unspecified cirrhosis of liver: Secondary | ICD-10-CM | POA: Diagnosis present

## 2015-11-16 DIAGNOSIS — I130 Hypertensive heart and chronic kidney disease with heart failure and stage 1 through stage 4 chronic kidney disease, or unspecified chronic kidney disease: Secondary | ICD-10-CM | POA: Diagnosis present

## 2015-11-16 DIAGNOSIS — N179 Acute kidney failure, unspecified: Secondary | ICD-10-CM | POA: Diagnosis present

## 2015-11-16 DIAGNOSIS — I5032 Chronic diastolic (congestive) heart failure: Secondary | ICD-10-CM | POA: Diagnosis present

## 2015-11-16 DIAGNOSIS — N183 Chronic kidney disease, stage 3 unspecified: Secondary | ICD-10-CM | POA: Diagnosis present

## 2015-11-16 DIAGNOSIS — Z7682 Awaiting organ transplant status: Secondary | ICD-10-CM

## 2015-11-16 DIAGNOSIS — R0602 Shortness of breath: Secondary | ICD-10-CM

## 2015-11-16 DIAGNOSIS — Z9889 Other specified postprocedural states: Secondary | ICD-10-CM

## 2015-11-16 DIAGNOSIS — Z6841 Body Mass Index (BMI) 40.0 and over, adult: Secondary | ICD-10-CM

## 2015-11-16 DIAGNOSIS — K7469 Other cirrhosis of liver: Secondary | ICD-10-CM | POA: Diagnosis present

## 2015-11-16 DIAGNOSIS — I10 Essential (primary) hypertension: Secondary | ICD-10-CM | POA: Diagnosis not present

## 2015-11-16 DIAGNOSIS — Z800 Family history of malignant neoplasm of digestive organs: Secondary | ICD-10-CM | POA: Diagnosis not present

## 2015-11-16 DIAGNOSIS — E869 Volume depletion, unspecified: Secondary | ICD-10-CM

## 2015-11-16 DIAGNOSIS — K7682 Hepatic encephalopathy: Secondary | ICD-10-CM | POA: Diagnosis present

## 2015-11-16 DIAGNOSIS — E860 Dehydration: Secondary | ICD-10-CM | POA: Diagnosis present

## 2015-11-16 DIAGNOSIS — R161 Splenomegaly, not elsewhere classified: Secondary | ICD-10-CM | POA: Diagnosis present

## 2015-11-16 DIAGNOSIS — N19 Unspecified kidney failure: Secondary | ICD-10-CM

## 2015-11-16 DIAGNOSIS — K7200 Acute and subacute hepatic failure without coma: Secondary | ICD-10-CM | POA: Diagnosis present

## 2015-11-16 DIAGNOSIS — J90 Pleural effusion, not elsewhere classified: Secondary | ICD-10-CM

## 2015-11-16 DIAGNOSIS — R4182 Altered mental status, unspecified: Secondary | ICD-10-CM | POA: Diagnosis present

## 2015-11-16 DIAGNOSIS — G4733 Obstructive sleep apnea (adult) (pediatric): Secondary | ICD-10-CM | POA: Diagnosis present

## 2015-11-16 LAB — SODIUM, URINE, RANDOM: Sodium, Ur: 45 mmol/L

## 2015-11-16 LAB — BASIC METABOLIC PANEL
Anion gap: 11 (ref 5–15)
BUN: 58 mg/dL — ABNORMAL HIGH (ref 6–20)
CO2: 17 mmol/L — ABNORMAL LOW (ref 22–32)
Calcium: 8.9 mg/dL (ref 8.9–10.3)
Chloride: 105 mmol/L (ref 101–111)
Creatinine, Ser: 2.25 mg/dL — ABNORMAL HIGH (ref 0.61–1.24)
GFR calc Af Amer: 34 mL/min — ABNORMAL LOW (ref >60)
GFR calc non Af Amer: 29 mL/min — ABNORMAL LOW (ref >60)
Glucose, Bld: 74 mg/dL (ref 65–99)
Potassium: 5.6 mmol/L — ABNORMAL HIGH (ref 3.5–5.1)
Sodium: 133 mmol/L — ABNORMAL LOW (ref 135–145)

## 2015-11-16 LAB — COMPREHENSIVE METABOLIC PANEL
ALBUMIN: 3 g/dL — AB (ref 3.5–5.0)
ALK PHOS: 126 U/L (ref 38–126)
ALT: 43 U/L (ref 17–63)
ANION GAP: 9 (ref 5–15)
AST: 55 U/L — AB (ref 15–41)
BILIRUBIN TOTAL: 3.6 mg/dL — AB (ref 0.3–1.2)
BUN: 62 mg/dL — AB (ref 6–20)
CALCIUM: 9.1 mg/dL (ref 8.9–10.3)
CO2: 20 mmol/L — ABNORMAL LOW (ref 22–32)
CREATININE: 2.39 mg/dL — AB (ref 0.61–1.24)
Chloride: 103 mmol/L (ref 101–111)
GFR calc Af Amer: 31 mL/min — ABNORMAL LOW (ref >60)
GFR calc non Af Amer: 27 mL/min — ABNORMAL LOW (ref >60)
GLUCOSE: 92 mg/dL (ref 65–99)
Potassium: 5.8 mmol/L — ABNORMAL HIGH (ref 3.5–5.1)
Sodium: 132 mmol/L — ABNORMAL LOW (ref 135–145)
TOTAL PROTEIN: 6.3 g/dL — AB (ref 6.5–8.1)

## 2015-11-16 LAB — URINALYSIS, ROUTINE W REFLEX MICROSCOPIC
Bilirubin Urine: NEGATIVE
Glucose, UA: NEGATIVE mg/dL
Hgb urine dipstick: NEGATIVE
Ketones, ur: NEGATIVE mg/dL
Leukocytes, UA: NEGATIVE
Nitrite: NEGATIVE
Protein, ur: NEGATIVE mg/dL
Specific Gravity, Urine: 1.017 (ref 1.005–1.030)
pH: 5 (ref 5.0–8.0)

## 2015-11-16 LAB — PHOSPHORUS: Phosphorus: 4.5 mg/dL (ref 2.5–4.6)

## 2015-11-16 LAB — PROTEIN, URINE, RANDOM: Total Protein, Urine: <6 mg/dL

## 2015-11-16 LAB — CBC
HCT: 34.6 % — ABNORMAL LOW (ref 39.0–52.0)
Hemoglobin: 11.9 g/dL — ABNORMAL LOW (ref 13.0–17.0)
MCH: 30.7 pg (ref 26.0–34.0)
MCHC: 34.4 g/dL (ref 30.0–36.0)
MCV: 89.2 fL (ref 78.0–100.0)
PLATELETS: 90 10*3/uL — AB (ref 150–400)
RBC: 3.88 MIL/uL — ABNORMAL LOW (ref 4.22–5.81)
RDW: 16.5 % — AB (ref 11.5–15.5)
WBC: 5.6 10*3/uL (ref 4.0–10.5)

## 2015-11-16 LAB — MRSA PCR SCREENING: MRSA by PCR: NEGATIVE

## 2015-11-16 LAB — AMMONIA: AMMONIA: 89 umol/L — AB (ref 9–35)

## 2015-11-16 LAB — CBG MONITORING, ED: GLUCOSE-CAPILLARY: 86 mg/dL (ref 65–99)

## 2015-11-16 LAB — CREATININE, URINE, RANDOM: Creatinine, Urine: 97.74 mg/dL

## 2015-11-16 LAB — PROTIME-INR
INR: 1.74 — ABNORMAL HIGH (ref 0.00–1.49)
Prothrombin Time: 19.7 seconds — ABNORMAL HIGH (ref 11.6–15.2)

## 2015-11-16 LAB — MAGNESIUM: Magnesium: 2.5 mg/dL — ABNORMAL HIGH (ref 1.7–2.4)

## 2015-11-16 MED ORDER — ALBUMIN HUMAN 25 % IV SOLN
25.0000 g | Freq: Three times a day (TID) | INTRAVENOUS | Status: AC
  Administered 2015-11-16 – 2015-11-18 (×6): 25 g via INTRAVENOUS
  Filled 2015-11-16: qty 100
  Filled 2015-11-16 (×2): qty 50
  Filled 2015-11-16: qty 100
  Filled 2015-11-16: qty 50
  Filled 2015-11-16: qty 100
  Filled 2015-11-16: qty 50
  Filled 2015-11-16 (×3): qty 100
  Filled 2015-11-16: qty 50

## 2015-11-16 MED ORDER — CITALOPRAM HYDROBROMIDE 20 MG PO TABS
10.0000 mg | ORAL_TABLET | Freq: Every day | ORAL | Status: DC
  Administered 2015-11-16 – 2015-11-21 (×6): 10 mg via ORAL
  Filled 2015-11-16 (×6): qty 1

## 2015-11-16 MED ORDER — RIFAXIMIN 550 MG PO TABS
550.0000 mg | ORAL_TABLET | Freq: Two times a day (BID) | ORAL | Status: DC
  Administered 2015-11-16 – 2015-11-21 (×10): 550 mg via ORAL
  Filled 2015-11-16 (×13): qty 1

## 2015-11-16 MED ORDER — LORAZEPAM 0.5 MG PO TABS
0.5000 mg | ORAL_TABLET | Freq: Once | ORAL | Status: AC
  Administered 2015-11-16: 0.5 mg via ORAL
  Filled 2015-11-16: qty 1

## 2015-11-16 MED ORDER — METOPROLOL SUCCINATE ER 25 MG PO TB24
25.0000 mg | ORAL_TABLET | Freq: Every day | ORAL | Status: DC
  Administered 2015-11-16: 25 mg via ORAL
  Filled 2015-11-16 (×2): qty 1

## 2015-11-16 MED ORDER — ADULT MULTIVITAMIN W/MINERALS CH
1.0000 | ORAL_TABLET | Freq: Every morning | ORAL | Status: DC
  Administered 2015-11-17 – 2015-11-21 (×5): 1 via ORAL
  Filled 2015-11-16 (×5): qty 1

## 2015-11-16 MED ORDER — SODIUM CHLORIDE 0.9 % IV SOLN
INTRAVENOUS | Status: AC
  Administered 2015-11-16: 17:00:00 via INTRAVENOUS

## 2015-11-16 MED ORDER — PANTOPRAZOLE SODIUM 40 MG PO TBEC
40.0000 mg | DELAYED_RELEASE_TABLET | Freq: Every day | ORAL | Status: DC
Start: 2015-11-16 — End: 2015-11-21
  Administered 2015-11-16 – 2015-11-21 (×6): 40 mg via ORAL
  Filled 2015-11-16 (×6): qty 1

## 2015-11-16 MED ORDER — ZINC SULFATE 220 (50 ZN) MG PO CAPS
220.0000 mg | ORAL_CAPSULE | Freq: Every day | ORAL | Status: DC
Start: 2015-11-16 — End: 2015-11-21
  Administered 2015-11-16 – 2015-11-21 (×6): 220 mg via ORAL
  Filled 2015-11-16 (×6): qty 1

## 2015-11-16 MED ORDER — HEPARIN SODIUM (PORCINE) 5000 UNIT/ML IJ SOLN: 5000.0000 [IU] | Freq: Two times a day (BID) | INTRAMUSCULAR | Status: DC

## 2015-11-16 MED ORDER — LACTULOSE 10 GM/15ML PO SOLN
15.0000 g | Freq: Three times a day (TID) | ORAL | Status: DC
  Administered 2015-11-16 – 2015-11-21 (×15): 15 g via ORAL
  Filled 2015-11-16 (×16): qty 30

## 2015-11-16 MED ORDER — LACTULOSE 10 GM/15ML PO SOLN
20.0000 g | Freq: Once | ORAL | Status: AC
Start: 2015-11-16 — End: 2015-11-16
  Administered 2015-11-16: 20 g via ORAL
  Filled 2015-11-16: qty 30

## 2015-11-16 NOTE — ED Notes (Signed)
Attempted blood draw in right AC. Was unsuccessful. Ultrasound now at bedside. Will notify RN.

## 2015-11-16 NOTE — H&P (Signed)
History and Physical  Peter Larson [id number] DOB: 08/17/1950 DOA: 11/16/2015  Referring physician: ER Physician PCP: [name], MD  Outpatient Specialists: Dr. 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 and said to be on transplant list. Patient also caries diagnosis of DM, hypertension, diastolic CHF, OSA, morbid obesity and chronic kidney disease. Patient presents with 2 day history of altered mental status and weakness. Patient is not able to give any history due to altered mentation. Patient's ammonia level seems to be at baseline, but patient looks volume depleted. Patient is on lasix and spironolactone. Worsening renal function is noted with elevated potassium. No leukocytosis. No reported fever or chills, no nausea or vomiting, no diarrhea, no abdominal pain and no urinary symptoms.    ED Course: Work up  Pertinent labs: Elevated potassium. Worsening renal function. Low voltage EKG. Ammonia is at baseline. EKG: Independently reviewed.  Imaging: independently reviewed.   Review of Systems: As in HPI. Negative for fever, visual changes, sore throat, rash, new muscle aches, chest pain, SOB, dysuria, bleeding, n/v/abdominal pain.  Past Medical History  Diagnosis Date  . Sleep apnea   . Splenomegaly     slt thrombocytopenia;no complics. egd Q000111Q neg for varices, ?mild portal gastropathy; 02/2011 nl,novarices  . 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 Hypertension   . Adenomatous colon polyp '01 & '08  . Hemorrhoids     Severe anorectal pain and anal fissure w bleeding following hemorrhoidectomy may 2010  . Family history of colon cancer     mother --10  . Seasonal allergies   . Screening     Hepatocellular Screening CT neg 05/2008, U/S neg  04/2009,05/2010  . Dysphagia     BaS/tab neg 8/09; egd's neg for ring/strict--?dysmotility  . Chronic diastolic congestive heart failure ([place])     a.  ECHO 01/13/14: EF 55-60%; moderate LVH, G2DD, biatrial enlargement; mild RVE; moderate MR; mildly elevated, pulm pressures.  . OSA (obstructive sleep apnea)   . Morbid obesity ([place])   . Frequent PVCs   . Pancytopenia ([place])   . Cirrhosis ([place])     a. Due to NASH, followed by [facility].  Marland Kitchen Shortness of breath   . Arthritis     RA  " ALL OVER "  . Dilatation of aorta (HCC)     a. Mild aortic root dilitation by echo 01/13/14  . Chronic hepatitis ([place])     cirrhosis  . Hepatic encephalopathy ([place])   . CKD (chronic kidney disease)   . Pleural effusion 08/2015    Past Surgical History  Procedure Laterality Date  . Fissure surgery    . Hemorrhoid surgery    . Esophagogastroduodenoscopy (egd) with propofol N/A 03/11/2015    Procedure: ESOPHAGOGASTRODUODENOSCOPY (EGD) WITH PROPOFOL;  Surgeon: Ronald Lobo, MD;  [facility];  Service: Endoscopy;  Laterality: N/A;  . Colonoscopy with propofol N/A 03/11/2015    Procedure: COLONOSCOPY WITH PROPOFOL;  Surgeon: Ronald Lobo, MD;  Location: [facility];  Service: Endoscopy;  Laterality: N/A;     reports that he has never smoked. He has never used smokeless tobacco. He reports that he does not drink alcohol or use illicit drugs.  No Known Allergies  Family History  Problem Relation Age of  Onset  . Cancer Mother     died of breast cancer  . Liver disease Father   . Thyroid disease Sister      Prior to Admission medications   Medication Sig Start Date End Date Taking? Authorizing Provider  acetaminophen (TYLENOL) 500 MG tablet Take 1,000 mg by mouth every 6 (six) hours as needed (Pain).   Yes Historical Provider, MD  cholecalciferol (VITAMIN D) 1000 UNITS tablet Take 2,000 Units by mouth every morning.    Yes Historical Provider, MD  citalopram (CELEXA) 10 MG tablet  Take 1 tablet by mouth daily. 09/30/15 09/29/16 Yes Historical Provider, MD  esomeprazole (NEXIUM) 40 MG capsule Take 40 mg by mouth every evening.    Yes Historical Provider, MD  famotidine (PEPCID) 20 MG tablet Take 1 tablet (20 mg total) by mouth as needed for heartburn or indigestion. 02/24/14  Yes Kinnie Feil, MD  furosemide (LASIX) 40 MG tablet Take 1 tablet (40 mg total) by mouth 2 (two) times daily. Patient taking differently: Take 40 mg by mouth daily. Alternate taking 40 and 60 mg every other day. 02/01/14  Yes Dayna N Dunn, PA-C  hydrOXYzine (ATARAX/VISTARIL) 25 MG tablet Take 25 mg by mouth every evening.  08/26/15  Yes Historical Provider, MD  lactulose (CHRONULAC) 10 GM/15ML solution Take 45 mLs (30 g total) by mouth 3 (three) times daily. Patient taking differently: Take 15 g by mouth 3 (three) times daily.  09/05/15  Yes Lavina Hamman, MD  metoprolol succinate (TOPROL-XL) 25 MG 24 hr tablet Take 1 tablet by mouth at bedtime. 10/27/15 10/26/16 Yes Historical Provider, MD  milk thistle 175 MG tablet Take 175 mg by mouth 2 (two) times daily.   Yes Historical Provider, MD  Multiple Vitamin (MULTIVITAMIN) tablet Take 1 tablet by mouth every morning.    Yes Historical Provider, MD  rifaximin (XIFAXAN) 550 MG TABS tablet Take 1 tablet (550 mg total) by mouth 2 (two) times daily. 02/13/14  Yes Geradine Girt, DO  simethicone (MYLICON) 80 MG chewable tablet Chew 1 tablet (80 mg total) by mouth 2 (two) times daily. Patient taking differently: Chew 160 mg by mouth 4 (four) times daily as needed for flatulence.  07/03/15  Yes Modena Jansky, MD  spironolactone (ALDACTONE) 50 MG tablet Take 1 tablet (50 mg total) by mouth 2 (two) times daily. Patient taking differently: Take 150 mg by mouth daily.  02/01/14  Yes Dayna N Dunn, PA-C  zinc sulfate 220 MG capsule Take 220 mg by mouth daily. 08/17/15 08/16/16 Yes Historical Provider, MD    Physical Exam: Filed Vitals:   11/16/15 0845 11/16/15 0851  11/16/15 1104 11/16/15 1416  BP:  110/48 127/64 123/53  Pulse:  74 79 70  Temp:  97.7 F (36.5 C)    TempSrc:  Oral    Resp:  19 21 19   SpO2: 92% 94% 100% 96%     Constitutional:  . Morbidly obese and drowsy.  Eyes:  . No pallor. Jaundice.  ENMT:  . Dry buccal mucosa. Marland Kitchen external ears, nose appear normal Neck:  . Neck is supple. No JVD Respiratory:  . CTA bilaterally, no w/r/r.  . Respiratory effort normal. No retractions or accessory muscle use Cardiovascular:  . S1S2 . No LE extremity edema   Abdomen:  . Abdomen is morbidly obese, soft and non tender. Organs are difficult to assess. Neurologic:  . Drowsy. . Moves all limbs.  Wt Readings from Last 3 Encounters:  09/05/15 145.3 kg (  320 lb 5.3 oz)  09/01/15 149.007 kg (328 lb 8 oz)  07/03/15 155.674 kg (343 lb 3.2 oz)    I have personally reviewed following labs and imaging studies  Labs on Admission:  CBC:  Recent Labs Lab 11/16/15 0919  WBC 5.6  HGB 11.9*  HCT 34.6*  MCV 89.2  PLT 90*   Basic Metabolic Panel:  Recent Labs Lab 11/16/15 0919  NA 132*  K 5.8*  CL 103  CO2 20*  GLUCOSE 92  BUN 62*  CREATININE 2.39*  CALCIUM 9.1   Liver Function Tests:  Recent Labs Lab 11/16/15 0919  AST 55*  ALT 43  ALKPHOS 126  BILITOT 3.6*  PROT 6.3*  ALBUMIN 3.0*   No results for input(s): LIPASE, AMYLASE in the last 168 hours.  Recent Labs Lab 11/16/15 0922  AMMONIA 89*   Coagulation Profile: No results for input(s): INR, PROTIME in the last 168 hours. Cardiac Enzymes: No results for input(s): CKTOTAL, CKMB, CKMBINDEX, TROPONINI in the last 168 hours. BNP (last 3 results) No results for input(s): PROBNP in the last 8760 hours. HbA1C: No results for input(s): HGBA1C in the last 72 hours. CBG:  Recent Labs Lab 11/16/15 0952  GLUCAP 86   Lipid Profile: No results for input(s): CHOL, HDL, LDLCALC, TRIG, CHOLHDL, LDLDIRECT in the last 72 hours. Thyroid Function Tests: No results for  input(s): TSH, T4TOTAL, FREET4, T3FREE, THYROIDAB in the last 72 hours. Anemia Panel: No results for input(s): VITAMINB12, FOLATE, FERRITIN, TIBC, IRON, RETICCTPCT in the last 72 hours. Urine analysis:    Component Value Date/Time   COLORURINE YELLOW 09/03/2015 2059   APPEARANCEUR CLEAR 09/03/2015 2059   LABSPEC 1.013 09/03/2015 2059   PHURINE 5.5 09/03/2015 2059   GLUCOSEU NEGATIVE 09/03/2015 2059   HGBUR NEGATIVE 09/03/2015 2059   BILIRUBINUR NEGATIVE 09/03/2015 2059   KETONESUR NEGATIVE 09/03/2015 2059   PROTEINUR NEGATIVE 09/03/2015 2059   UROBILINOGEN 1.0 02/23/2014 1710   NITRITE NEGATIVE 09/03/2015 2059   LEUKOCYTESUR NEGATIVE 09/03/2015 2059   Sepsis Labs: @LABRCNTIP (procalcitonin:4,lacticidven:4) )No results found for this or any previous visit (from the past 240 hour(s)).    Radiological Exams on Admission: Ct Head Wo Contrast  11/16/2015  CLINICAL DATA:  Altered mental status EXAM: CT HEAD WITHOUT CONTRAST TECHNIQUE: Contiguous axial images were obtained from the base of the skull through the vertex without intravenous contrast. COMPARISON:  02/12/2014 FINDINGS: Ventricle size normal.  Cerebral volume normal for age. Negative for acute or chronic infarction. Negative for hemorrhage or mass lesion. No fluid collection or midline shift. Normal calvarium. IMPRESSION: Negative Electronically Signed   By: Franchot Gallo M.D.   On: 11/16/2015 13:40    EKG: Independently reviewed.   Active Problems:   Hepatic encephalopathy (HCC)   Encephalopathy   Assessment/Plan 1. Likely Metabolic encephalopathy 2. Can not rule out Hepatic encephalopathy (but Ammonia is at baseline) 3. Volume depletion 4. AKI on CKD (likely pre renal. Doubt Hepato renal) 5. Hyperkalemia - likely secondary to AKI 6. Morbid obesity 7. Non alcoholic fatty liver disease with cirrhosis 8. Morbid Obesity 9. Diastolic CHF, compensated 10. OSA by history  11. History of DM and HTN   Admit patient to  step down  Hold lasix and aldactone  Monitor volume status (Likely decreased effective circulatory volume)  IV albumin  Continue rifaximine   IVF normal saline 60ml/hr X 1 liter and reassess (will also help with hyperkalemia)  AKI work up  Renal ultrasound  Optimize BP and blood sugar  Monitor  renal function and electrolytes  DVT prophylaxis: [place] Heparin Code Status: Full Family Communication: Wife Disposition Plan: Home eventually   Consults called: None. Low threshold to consult GI and renal    Admission status: Inpatient    Time spent: 60 minutes  Dana Allan, MD  [facility] Pager #: [phone] 7PM-7AM contact night coverage as above   11/16/2015, 3:09 PM

## 2015-11-16 NOTE — ED Provider Notes (Signed)
CSN: [id number]     Arrival date & time 11/16/15  0844 History   First MD Initiated Contact with Patient 11/16/15 0930     Chief Complaint  Patient presents with  . Altered Mental Status     (Consider location/radiation/quality/duration/timing/severity/associated sxs/prior Treatment) HPI Comments: Patient here with 2 days of altered mental status consistent with his prior episodes of hepatic encephalopathy. Patient has a history of fatty liver disease and per his wife his ammonia level normally runs between 80 and 90. Denies any recent fever, falls. She has increased his lactulose recently as he has had a good result. States that he is a gradual confusion. No cough or congestion. Symptoms have been persistent and nothing makes them better.  Patient is a 65 y.o. male presenting with altered mental status. The history is provided by the patient and the spouse.  Altered Mental Status   Past Medical History  Diagnosis Date  . Sleep apnea   . Splenomegaly     slt thrombocytopenia;no complics. egd Q000111Q neg for varices, ?mild portal gastropathy; 02/2011 nl,novarices  . 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 Hypertension   . Adenomatous colon polyp '01 & '08  . Hemorrhoids     Severe anorectal pain and anal fissure w bleeding following hemorrhoidectomy may 2010  . Family history of colon cancer     mother --54  . Seasonal allergies   . Screening     Hepatocellular Screening CT neg 05/2008, U/S neg 04/2009,05/2010  . Dysphagia     BaS/tab neg 8/09; egd's neg for ring/strict--?dysmotility  . Chronic diastolic congestive heart failure ([place])     a.  ECHO 01/13/14: EF 55-60%; moderate LVH, G2DD, biatrial enlargement; mild RVE; moderate MR; mildly elevated, pulm pressures.  . OSA (obstructive sleep apnea)   . Morbid obesity ([place])   . Frequent PVCs   . Pancytopenia ([place])   . Cirrhosis ([place])     a. Due to NASH, followed by [facility].  Marland Kitchen Shortness of breath   .  Arthritis     RA  " ALL OVER "  . Dilatation of aorta (HCC)     a. Mild aortic root dilitation by echo 01/13/14  . Chronic hepatitis ([place])     cirrhosis  . Hepatic encephalopathy ([place])   . CKD (chronic kidney disease)   . Pleural effusion 08/2015   Past Surgical History  Procedure Laterality Date  . Fissure surgery    . Hemorrhoid surgery    . Esophagogastroduodenoscopy (egd) with propofol N/A 03/11/2015    Procedure: ESOPHAGOGASTRODUODENOSCOPY (EGD) WITH PROPOFOL;  Surgeon: Ronald Lobo, MD;  [facility];  Service: Endoscopy;  Laterality: N/A;  . Colonoscopy with propofol N/A 03/11/2015    Procedure: COLONOSCOPY WITH PROPOFOL;  Surgeon: Ronald Lobo, MD;  [facility];  Service: Endoscopy;  Laterality: N/A;   Family History  Problem Relation Age of Onset  . Cancer Mother     died of breast cancer  . Liver disease Father   . Thyroid disease Sister    Social History  Substance Use Topics  . Smoking status: Never Smoker   . Smokeless tobacco: Never Used  . Alcohol Use: No     Comment: quit 2008    Review of Systems  All other systems reviewed and are negative.     Allergies  Review of patient's allergies indicates no known allergies.  Home Medications   Prior to Admission medications   Medication  Sig Start Date End Date Taking? Authorizing Provider  cholecalciferol (VITAMIN D) 1000 UNITS tablet Take 3,000 Units by mouth every morning.     Historical Provider, MD  citalopram (CELEXA) 10 MG tablet Take 1 tablet by mouth daily. 09/30/15 09/29/16  Historical Provider, MD  esomeprazole (NEXIUM) 40 MG capsule Take 40 mg by mouth every evening.     Historical Provider, MD  famotidine (PEPCID) 20 MG tablet Take 1 tablet (20 mg total) by mouth as needed for heartburn or indigestion. 02/24/14   Kinnie Feil, MD  furosemide (LASIX) 40 MG tablet Take 1 tablet (40 mg total) by mouth 2 (two) times daily. Patient taking differently: Take 40 mg by mouth daily.  Alternate taking 40 and 60 mg every other day. 02/01/14   Dayna N Dunn, PA-C  hydrOXYzine (ATARAX/VISTARIL) 25 MG tablet Take 25 mg by mouth every evening.  08/26/15   Historical Provider, MD  lactulose (CHRONULAC) 10 GM/15ML solution Take 45 mLs (30 g total) by mouth 3 (three) times daily. 09/05/15   Lavina Hamman, MD  metoprolol succinate (TOPROL-XL) 25 MG 24 hr tablet Take 1 tablet by mouth at bedtime. 10/27/15 10/26/16  Historical Provider, MD  Multiple Vitamin (MULTIVITAMIN) tablet Take 1 tablet by mouth every morning.     Historical Provider, MD  rifaximin (XIFAXAN) 550 MG TABS tablet Take 1 tablet (550 mg total) by mouth 2 (two) times daily. 02/13/14   Geradine Girt, DO  simethicone (MYLICON) 80 MG chewable tablet Chew 1 tablet (80 mg total) by mouth 2 (two) times daily. 07/03/15   Modena Jansky, MD  spironolactone (ALDACTONE) 50 MG tablet Take 1 tablet (50 mg total) by mouth 2 (two) times daily. Patient taking differently: Take 150 mg by mouth daily.  02/01/14   Dayna N Dunn, PA-C  zinc sulfate 220 MG capsule Take 220 mg by mouth daily. 08/17/15 08/16/16  Historical Provider, MD   BP 110/48 mmHg  Pulse 74  Temp(Src) 97.7 F (36.5 C) (Oral)  Resp 19  SpO2 94% Physical Exam  Constitutional: He appears well-developed and well-nourished. He appears lethargic.  Non-toxic appearance. No distress.  HENT:  Head: Normocephalic and atraumatic.  Eyes: Conjunctivae, EOM and lids are normal. Pupils are equal, round, and reactive to light.  Neck: Normal range of motion. Neck supple. No tracheal deviation present. No thyroid mass present.  Cardiovascular: Normal rate, regular rhythm and normal heart sounds.  Exam reveals no gallop.   No murmur heard. Pulmonary/Chest: Effort normal and breath sounds normal. No stridor. No respiratory distress. He has no decreased breath sounds. He has no wheezes. He has no rhonchi. He has no rales.  Abdominal: Soft. Normal appearance and bowel sounds are normal. He  exhibits no distension and no ascites. There is no tenderness. There is no rebound and no CVA tenderness.  Musculoskeletal: Normal range of motion. He exhibits no edema or tenderness.  Neurological: He appears lethargic. No cranial nerve deficit. GCS eye subscore is 4. GCS verbal subscore is 3. GCS motor subscore is 5.  Skin: Skin is warm and dry. No abrasion and no rash noted.  Psychiatric: His speech is slurred. He is inattentive.  Nursing note and vitals reviewed.   ED Course  Procedures (including critical care time) Labs Review Labs Reviewed  COMPREHENSIVE METABOLIC PANEL  CBC  AMMONIA  CBG MONITORING, ED    Imaging Review No results found. I have personally reviewed and evaluated these images and lab results as part of my medical  decision-making.   EKG Interpretation None      MDM   Final diagnoses:  None    Pt to be given lactulose here and admitted by the hospitalist    Lacretia Leigh, MD 11/16/15 1411

## 2015-11-16 NOTE — ED Notes (Signed)
Bed: [id number] Expected date:  Expected time:  Means of arrival:  Comments: EMS- 65yo M, AMS/elevated ammonia?

## 2015-11-16 NOTE — ED Notes (Signed)
Per [facility] pt from home reports altered mental status and confusion today, disoriented x 2. Hx liver disease and high levels of ammonia per EMS per family.

## 2015-11-17 ENCOUNTER — Inpatient Hospital Stay (HOSPITAL_COMMUNITY): Payer: 59

## 2015-11-17 DIAGNOSIS — N183 Chronic kidney disease, stage 3 (moderate): Secondary | ICD-10-CM

## 2015-11-17 DIAGNOSIS — K7290 Hepatic failure, unspecified without coma: Principal | ICD-10-CM

## 2015-11-17 DIAGNOSIS — I10 Essential (primary) hypertension: Secondary | ICD-10-CM

## 2015-11-17 DIAGNOSIS — K7581 Nonalcoholic steatohepatitis (NASH): Secondary | ICD-10-CM

## 2015-11-17 LAB — CBC
HCT: 29.5 % — ABNORMAL LOW (ref 39.0–52.0)
Hemoglobin: 10 g/dL — ABNORMAL LOW (ref 13.0–17.0)
MCH: 31.1 pg (ref 26.0–34.0)
MCHC: 33.9 g/dL (ref 30.0–36.0)
MCV: 91.6 fL (ref 78.0–100.0)
Platelets: 68 10*3/uL — ABNORMAL LOW (ref 150–400)
RBC: 3.22 MIL/uL — ABNORMAL LOW (ref 4.22–5.81)
RDW: 16.5 % — ABNORMAL HIGH (ref 11.5–15.5)
WBC: 5.5 10*3/uL (ref 4.0–10.5)

## 2015-11-17 LAB — COMPREHENSIVE METABOLIC PANEL
ALT: 34 U/L (ref 17–63)
AST: 45 U/L — ABNORMAL HIGH (ref 15–41)
Albumin: 2.8 g/dL — ABNORMAL LOW (ref 3.5–5.0)
Alkaline Phosphatase: 95 U/L (ref 38–126)
Anion gap: 6 (ref 5–15)
BUN: 57 mg/dL — ABNORMAL HIGH (ref 6–20)
CO2: 22 mmol/L (ref 22–32)
Calcium: 8.8 mg/dL — ABNORMAL LOW (ref 8.9–10.3)
Chloride: 105 mmol/L (ref 101–111)
Creatinine, Ser: 2.14 mg/dL — ABNORMAL HIGH (ref 0.61–1.24)
GFR calc Af Amer: 36 mL/min — ABNORMAL LOW (ref >60)
GFR calc non Af Amer: 31 mL/min — ABNORMAL LOW (ref >60)
Glucose, Bld: 75 mg/dL (ref 65–99)
Potassium: 5.6 mmol/L — ABNORMAL HIGH (ref 3.5–5.1)
Sodium: 133 mmol/L — ABNORMAL LOW (ref 135–145)
Total Bilirubin: 3.4 mg/dL — ABNORMAL HIGH (ref 0.3–1.2)
Total Protein: 5.2 g/dL — ABNORMAL LOW (ref 6.5–8.1)

## 2015-11-17 MED ORDER — AMLODIPINE BESYLATE 10 MG PO TABS
10.0000 mg | ORAL_TABLET | Freq: Every day | ORAL | Status: DC
  Administered 2015-11-17 – 2015-11-18 (×2): 10 mg via ORAL
  Filled 2015-11-17 (×2): qty 1

## 2015-11-17 NOTE — Care Management Note (Signed)
Case Management Note  Patient Details  Name: Peter Larson MRN: [id number] Date of Birth: 03-Apr-1951  Subjective/Objective:            Acute hepatic encephalopathy        Action/Plan: Date:  November 17, 2015 Chart reviewed for concurrent status and case management needs. Will continue to follow the patient for changes and needs: Expected discharge date: [id number] Velva Harman, BSN, [place], [place]   Expected Discharge Date:   (unknown)               Expected Discharge Plan:     In-House Referral:     Discharge planning Services     Post Acute Care Choice:    Choice offered to:     DME Arranged:    DME Agency:     HH Arranged:    [place] Agency:     Status of Service:     Medicare Important Message Given:    Date Medicare IM Given:    Medicare IM give by:    Date Additional Medicare IM Given:    Additional Medicare Important Message give by:     If discussed at [place] of Stay Meetings, dates discussed:    Additional Comments:  Leeroy Cha, RN 11/17/2015, 8:55 AM

## 2015-11-17 NOTE — Progress Notes (Deleted)
PT Cancellation Note  Patient Details Name: Peter Larson MRN: [id number] DOB: 1950-09-04   Cancelled Treatment:    Reason Eval/Treat Not Completed: Patient at procedure or test/unavailable (for drainage of abcess. will check back at another time as schedule allows and patient is medically ready. )   Claretha Cooper 11/17/2015, 9:21 AM Tresa Endo PT [phone]

## 2015-11-17 NOTE — Progress Notes (Signed)
PROGRESS NOTE    Peter Larson  [id number] DOB: 03/10/1951 DOA: 11/16/2015 PCP: [name], MD   Brief Narrative:  Peter Larson is a 65 year old gentleman with a past medical history of nonalcoholic fatty liver disease with cirrhosis, having history hepatic encephalopathy, with multiple hospitalizations this year for encephalopathy. He is currently being seen at [facility]. He was admitted on 11/16/2015, presented with complaints of worsening confusion and a generalized weakness. His ref reported patient having a gradual decline over the past 3 months becoming increasingly weaker and having several falls.   Assessment & Plan:   Principal Problem:   Hepatic encephalopathy (HCC) Active Problems:   Essential hypertension, benign   Chronic diastolic congestive heart failure, NYHA class 1 (HCC)   CKD (chronic kidney disease), stage III   Liver cirrhosis secondary to NASH   AKI (acute kidney injury) ([place])   Acute renal failure superimposed on stage 3 chronic kidney disease ([place])  1.  Hepatic encephalopathy -Peter Larson is a pleasant 65 year old gentleman with a history of advanced liver disease, admitted for probable hepatic encephalopathy. -Initial lab work showing an ammonia level of 89 -This is to be some improvement on this mornings evaluation. -He remains on rifaximin 515 g by mouth twice a day along with lactulose 15 g by mouth 3 times a day -Plan to follow him clinically, close monitoring in the step down unit for now.  2.  Functional decline/failure to thrive -Peter Larson's wife reporting that he has had a progressive decline over the past 3 months, becoming progressively weaker having several falls at home. At one point she had to call paramedics to help get him back up. -I suspect related to advancing liver disease. Dehydration could also be contributed factor. -Will consult physical therapy and occupational therapy  3.  Acute on chronic renal  failure -Presented with creatinine of 2.39, baseline creatinine appears to flexure between 1.5 and 1.8. -Diuretics were held on admission, creatinine improving to 2.14 on a.m. lab work  4.  Hypertension. -Blood pressure stable, he had been on metoprolol however nursing staff reported that overnight he had bradycardia with heart rates in the 40s -Having bradycardia overnight to think beta blocker therapy should be stopped --Start Norvasc10 mg PO q daily  DVT prophylaxis: SCD's Code Status: Full Code Family Communication: I spoke with his wife over telephone Disposition Plan: Continue close monitoring in the step down unit  Consultants:     Procedures:    Subjective: He remains confused however was able, and this was [facility] in [place], [place], [place]  Objective: Filed Vitals:   11/17/15 0400 11/17/15 0500 11/17/15 0600 11/17/15 0806  BP: 118/40 131/41 133/45   Pulse: 57 61 56   Temp: 98 F (36.7 C)  96.7 F (35.9 C) 97.8 F (36.6 C)  TempSrc: Axillary  Axillary Oral  Resp: 15 15 15    Height:      Weight: 143.8 kg (317 lb 0.3 oz)     SpO2: 93% 95% 99%     Intake/Output Summary (Last 24 hours) at 11/17/15 0828 Last data filed at 11/17/15 0300  Gross per 24 hour  Intake 815.83 ml  Output    450 ml  Net 365.83 ml   Filed Weights   11/16/15 1621 11/17/15 0400  Weight: 142.1 kg (313 lb 4.4 oz) 143.8 kg (317 lb 0.3 oz)    Examination:  General exam: Appears mildly lethargic however arousable Respiratory system: Coarse respiratory sounds bibasilar crackles  Cardiovascular system: S1 & S2 heard, RRR. No JVD, murmurs, rubs, gallops or clicks. No pedal edema. Gastrointestinal system: Abdomen is distended, he did not have similar complaint with palpation. No rebound tenderness or guarding. Central nervous system: Alert and oriented. No focal neurological deficits. Extremities: Symmetric 5 x 5 power. Skin: No rashes, lesions or  ulcers Psychiatry: Patient appearing to be confused, disoriented    Data Reviewed: I have personally reviewed following labs and imaging studies  CBC:  Recent Labs Lab 11/16/15 0919 11/17/15 0309  WBC 5.6 5.5  HGB 11.9* 10.0*  HCT 34.6* 29.5*  MCV 89.2 91.6  PLT 90* 68*   Basic Metabolic Panel:  Recent Labs Lab 11/16/15 0919 11/16/15 1809 11/17/15 0309  NA 132* 133* 133*  K 5.8* 5.6* 5.6*  CL 103 105 105  CO2 20* 17* 22  GLUCOSE 92 74 75  BUN 62* 58* 57*  CREATININE 2.39* 2.25* 2.14*  CALCIUM 9.1 8.9 8.8*  MG  --  2.5*  --   PHOS  --  4.5  --    GFR: Estimated Creatinine Clearance: 52.7 mL/min (by C-G formula based on Cr of 2.14). Liver Function Tests:  Recent Labs Lab 11/16/15 0919 11/17/15 0309  AST 55* 45*  ALT 43 34  ALKPHOS 126 95  BILITOT 3.6* 3.4*  PROT 6.3* 5.2*  ALBUMIN 3.0* 2.8*   No results for input(s): LIPASE, AMYLASE in the last 168 hours.  Recent Labs Lab 11/16/15 0922  AMMONIA 89*   Coagulation Profile:  Recent Labs Lab 11/16/15 1809  INR 1.74*   Cardiac Enzymes: No results for input(s): CKTOTAL, CKMB, CKMBINDEX, TROPONINI in the last 168 hours. BNP (last 3 results) No results for input(s): PROBNP in the last 8760 hours. HbA1C: No results for input(s): HGBA1C in the last 72 hours. CBG:  Recent Labs Lab 11/16/15 0952  GLUCAP 86   Lipid Profile: No results for input(s): CHOL, HDL, LDLCALC, TRIG, CHOLHDL, LDLDIRECT in the last 72 hours. Thyroid Function Tests: No results for input(s): TSH, T4TOTAL, FREET4, T3FREE, THYROIDAB in the last 72 hours. Anemia Panel: No results for input(s): VITAMINB12, FOLATE, FERRITIN, TIBC, IRON, RETICCTPCT in the last 72 hours. Sepsis Labs: No results for input(s): PROCALCITON, LATICACIDVEN in the last 168 hours.  Recent Results (from the past 240 hour(s))  MRSA PCR Screening     Status: None   Collection Time: 11/16/15  4:15 PM  Result Value Ref Range Status   MRSA by PCR NEGATIVE  NEGATIVE Final    Comment:        The GeneXpert MRSA Assay (FDA approved for NASAL specimens only), is one component of a comprehensive MRSA colonization surveillance program. It is not intended to diagnose MRSA infection nor to guide or monitor treatment for MRSA infections.          Radiology Studies: Ct Head Wo Contrast  11/16/2015  CLINICAL DATA:  Altered mental status EXAM: CT HEAD WITHOUT CONTRAST TECHNIQUE: Contiguous axial images were obtained from the base of the skull through the vertex without intravenous contrast. COMPARISON:  02/12/2014 FINDINGS: Ventricle size normal.  Cerebral volume normal for age. Negative for acute or chronic infarction. Negative for hemorrhage or mass lesion. No fluid collection or midline shift. Normal calvarium. IMPRESSION: Negative Electronically Signed   By: Franchot Gallo M.D.   On: 11/16/2015 13:40   US Renal  11/16/2015  CLINICAL DATA:  Acute renal failure EXAM: RENAL / URINARY TRACT ULTRASOUND COMPLETE COMPARISON:  November 19, 2013 FINDINGS: Right Kidney: Length:  11.9 cm. Echogenicity within normal limits. There is renal cortical thinning. No mass perinephric fluid, or hydronephrosis visualized. No sonographically demonstrable calculus or ureterectasis. Left Kidney: Length: 12.2 cm. Echogenicity within normal limits. There is renal cortical thinning. No mass, perinephric fluid, or hydronephrosis visualized. No sonographically demonstrable calculus or ureterectasis. Bladder: Appears normal for degree of bladder distention. There is ascites in the right upper abdomen. The contour and echotexture of the liver are indicative of underlying cirrhosis. IMPRESSION: There is bilateral renal cortical thinning, a finding associated with medical renal disease. The renal echogenicity is normal. No obstructing foci are identified in either kidney. There is ascites. The contour and echotexture of the liver are consistent with underlying hepatic cirrhosis.  Electronically Signed   By: Lowella Grip III M.D.   On: 11/16/2015 16:03        Scheduled Meds: . albumin human  25 g Intravenous Q8H  . citalopram  10 mg Oral Daily  . lactulose  15 g Oral TID  . metoprolol succinate  25 mg Oral QHS  . multivitamin with minerals  1 tablet Oral q morning - 10a  . pantoprazole  40 mg Oral Daily  . rifaximin  550 mg Oral BID  . zinc sulfate  220 mg Oral Daily   Continuous Infusions: . sodium chloride 50 mL/hr at 11/16/15 2000     LOS: 1 day    Time spent: 35 minutes    Kelvin Cellar, MD [facility] Pager [phone]  If 7PM-7AM, please contact night-coverage [email or web address] Password [facility] 11/17/2015, 8:28 AM

## 2015-11-18 LAB — BASIC METABOLIC PANEL
ANION GAP: 5 (ref 5–15)
BUN: 60 mg/dL — ABNORMAL HIGH (ref 6–20)
CHLORIDE: 104 mmol/L (ref 101–111)
CO2: 23 mmol/L (ref 22–32)
CREATININE: 1.99 mg/dL — AB (ref 0.61–1.24)
Calcium: 9.1 mg/dL (ref 8.9–10.3)
GFR calc non Af Amer: 34 mL/min — ABNORMAL LOW (ref >60)
GFR, EST AFRICAN AMERICAN: 39 mL/min — AB (ref >60)
Glucose, Bld: 78 mg/dL (ref 65–99)
POTASSIUM: 5.6 mmol/L — AB (ref 3.5–5.1)
SODIUM: 132 mmol/L — AB (ref 135–145)

## 2015-11-18 LAB — AMMONIA: AMMONIA: 40 umol/L — AB (ref 9–35)

## 2015-11-18 NOTE — Progress Notes (Signed)
PROGRESS NOTE    Peter Larson  [id number] DOB: 05-Dec-1950 DOA: 11/16/2015 PCP: [name], MD   Brief Narrative:  Peter Larson is a 65 year old gentleman with a past medical history of nonalcoholic fatty liver disease with cirrhosis, having history hepatic encephalopathy, with multiple hospitalizations this year for encephalopathy. He is currently being seen at [facility]. He was admitted on 11/16/2015, presented with complaints of worsening confusion and a generalized weakness. His ref reported patient having a gradual decline over the past 3 months becoming increasingly weaker and having several falls.   Assessment & Plan:   Principal Problem:   Hepatic encephalopathy (HCC) Active Problems:   Essential hypertension, benign   Chronic diastolic congestive heart failure, NYHA class 1 (HCC)   CKD (chronic kidney disease), stage III   Liver cirrhosis secondary to NASH   AKI (acute kidney injury) ([employer])   Acute renal failure superimposed on stage 3 chronic kidney disease ([place])  1.  Hepatic encephalopathy -Peter Larson is a pleasant 65 year old gentleman with a history of advanced liver disease, admitted for probable hepatic encephalopathy. -Initial lab work showing an ammonia level of 59 -He is being treated with rifaximin 515 g by mouth twice a day along with lactulose 15 g by mouth 3 times a day -Lab work on 11/18/2015 showing downward trend in ammonia level to 40. Clinically he is showing improvement, he was assisted out of bed to chair -We'll transfer to [place]  2.  Functional decline/failure to thrive -Peter Larson's wife reporting that he has had a progressive decline over the past 3 months, becoming progressively weaker having several falls at home. At one point she had to call paramedics to help get him back up. -I suspect related to advancing liver disease. Dehydration could also be contributed factor. -On my assessment on 11/18/2015 he showed clinical  improvement, assisted out of bed to chair. Physical therapy consulted.  3.  Acute on chronic renal failure -Presented with creatinine of 2.39, baseline creatinine appears to flexure between 1.5 and 1.8. -Diuretics were held on admission -Lab work on 11/18/2015 showing downward trend in his creatinine to 1.99. Will continue holding diuretic therapy for the next 24 hours.  4.  Hypertension. -Blood pressure stable, he had been on metoprolol however nursing staff reported that overnight he had bradycardia with heart rates in the 40s -Having bradycardia overnight to think beta blocker therapy should be stopped --Started Norvasc65 mg PO q daily  DVT prophylaxis: SCD's Code Status: Full Code Family Communication: I spoke with his wife over telephone Disposition Plan: Plan to transfer out of the stepdown unit to [place]. Physical therapy consultation.  Consultants:     Procedures:    Subjective: He looks little better today, more interactive, awake and alert.  Objective: Filed Vitals:   11/18/15 0000 11/18/15 0200 11/18/15 0400 11/18/15 0600  BP: 120/49 111/52 98/29 128/49  Pulse: 58 57 55 56  Temp: 97.9 F (36.6 C)  97.3 F (36.3 C)   TempSrc: Oral  Oral   Resp: 18 22 14 19   Height:      Weight:      SpO2: 94% 97% 97% 98%    Intake/Output Summary (Last 24 hours) at 11/18/15 0807 Last data filed at 11/18/15 0600  Gross per 24 hour  Intake    450 ml  Output    925 ml  Net   -475 ml   Filed Weights   11/16/15 1621 11/17/15 0400  Weight: 142.1 kg (313 lb 4.4  oz) 143.8 kg (317 lb 0.3 oz)    Examination:  General exam: Awake and alert interactive Respiratory system: Coarse respiratory sounds bibasilar crackles Cardiovascular system: S1 & S2 heard, RRR. No JVD, murmurs, rubs, gallops or clicks. No pedal edema. Gastrointestinal system: Abdomen is distended, he did not have similar complaint with palpation. No rebound tenderness or guarding. Central nervous system: Alert  and oriented. No focal neurological deficits. Extremities: Symmetric 5 x 5 power. Skin: No rashes, lesions or ulcers Psychiatry: Patient appearing to be confused, disoriented    Data Reviewed: I have personally reviewed following labs and imaging studies  CBC:  Recent Labs Lab 11/16/15 0919 11/17/15 0309  WBC 5.6 5.5  HGB 11.9* 10.0*  HCT 34.6* 29.5*  MCV 89.2 91.6  PLT 90* 68*   Basic Metabolic Panel:  Recent Labs Lab 11/16/15 0919 11/16/15 1809 11/17/15 0309 11/18/15 0646  NA 132* 133* 133* 132*  K 5.8* 5.6* 5.6* 5.6*  CL 103 105 105 104  CO2 20* 17* 22 23  GLUCOSE 92 74 75 78  BUN 62* 58* 57* 60*  CREATININE 2.39* 2.25* 2.14* 1.99*  CALCIUM 9.1 8.9 8.8* 9.1  MG  --  2.5*  --   --   PHOS  --  4.5  --   --    GFR: Estimated Creatinine Clearance: 56.6 mL/min (by C-G formula based on Cr of 1.99). Liver Function Tests:  Recent Labs Lab 11/16/15 0919 11/17/15 0309  AST 55* 45*  ALT 43 34  ALKPHOS 126 95  BILITOT 3.6* 3.4*  PROT 6.3* 5.2*  ALBUMIN 3.0* 2.8*   No results for input(s): LIPASE, AMYLASE in the last 168 hours.  Recent Labs Lab 11/16/15 0922 11/18/15 0646  AMMONIA 89* 40*   Coagulation Profile:  Recent Labs Lab 11/16/15 1809  INR 1.74*   Cardiac Enzymes: No results for input(s): CKTOTAL, CKMB, CKMBINDEX, TROPONINI in the last 168 hours. BNP (last 3 results) No results for input(s): PROBNP in the last 8760 hours. HbA1C: No results for input(s): HGBA1C in the last 72 hours. CBG:  Recent Labs Lab 11/16/15 0952  GLUCAP 86   Lipid Profile: No results for input(s): CHOL, HDL, LDLCALC, TRIG, CHOLHDL, LDLDIRECT in the last 72 hours. Thyroid Function Tests: No results for input(s): TSH, T4TOTAL, FREET4, T3FREE, THYROIDAB in the last 72 hours. Anemia Panel: No results for input(s): VITAMINB12, FOLATE, FERRITIN, TIBC, IRON, RETICCTPCT in the last 72 hours. Sepsis Labs: No results for input(s): PROCALCITON, LATICACIDVEN in the last  168 hours.  Recent Results (from the past 240 hour(s))  MRSA PCR Screening     Status: None   Collection Time: 11/16/15  4:15 PM  Result Value Ref Range Status   MRSA by PCR NEGATIVE NEGATIVE Final    Comment:        The GeneXpert MRSA Assay (FDA approved for NASAL specimens only), is one component of a comprehensive MRSA colonization surveillance program. It is not intended to diagnose MRSA infection nor to guide or monitor treatment for MRSA infections.          Radiology Studies: Dg Chest 1 View  11/17/2015  CLINICAL DATA:  Shortness of breath EXAM: CHEST 1 VIEW COMPARISON:  09/06/2015 FINDINGS: The tail like opacification of the entire right lung is new from the previous exam and is favored to represent enlarging right pleural effusion. Left lung is clear. There is pulmonary vascular congestion. IMPRESSION: 1. Veil like opacification of the right lung. Unchanged from previous exam. Electronically Signed  By: Kerby Moors M.D.   On: 11/17/2015 08:45   Ct Head Wo Contrast  11/16/2015  CLINICAL DATA:  Altered mental status EXAM: CT HEAD WITHOUT CONTRAST TECHNIQUE: Contiguous axial images were obtained from the base of the skull through the vertex without intravenous contrast. COMPARISON:  02/12/2014 FINDINGS: Ventricle size normal.  Cerebral volume normal for age. Negative for acute or chronic infarction. Negative for hemorrhage or mass lesion. No fluid collection or midline shift. Normal calvarium. IMPRESSION: Negative Electronically Signed   By: Franchot Gallo M.D.   On: 11/16/2015 13:40   US Renal  11/16/2015  CLINICAL DATA:  Acute renal failure EXAM: RENAL / URINARY TRACT ULTRASOUND COMPLETE COMPARISON:  November 19, 2013 FINDINGS: Right Kidney: Length: 11.9 cm. Echogenicity within normal limits. There is renal cortical thinning. No mass perinephric fluid, or hydronephrosis visualized. No sonographically demonstrable calculus or ureterectasis. Left Kidney: Length: 12.2 cm.  Echogenicity within normal limits. There is renal cortical thinning. No mass, perinephric fluid, or hydronephrosis visualized. No sonographically demonstrable calculus or ureterectasis. Bladder: Appears normal for degree of bladder distention. There is ascites in the right upper abdomen. The contour and echotexture of the liver are indicative of underlying cirrhosis. IMPRESSION: There is bilateral renal cortical thinning, a finding associated with medical renal disease. The renal echogenicity is normal. No obstructing foci are identified in either kidney. There is ascites. The contour and echotexture of the liver are consistent with underlying hepatic cirrhosis. Electronically Signed   By: Lowella Grip III M.D.   On: 11/16/2015 16:03        Scheduled Meds: . amLODipine  10 mg Oral Daily  . citalopram  10 mg Oral Daily  . lactulose  15 g Oral TID  . multivitamin with minerals  1 tablet Oral q morning - 10a  . pantoprazole  40 mg Oral Daily  . rifaximin  550 mg Oral BID  . zinc sulfate  220 mg Oral Daily   Continuous Infusions:     LOS: 2 days    Time spent: 35 minutes    Kelvin Cellar, MD [facility] Pager [phone]  If 7PM-7AM, please contact night-coverage [email or web address] Password [email or web address] 11/18/2015, 8:07 AM

## 2015-11-18 NOTE — Evaluation (Signed)
Physical Therapy Evaluation Patient Details Name: Peter Larson MRN: [id number] DOB: July 13, 1950 Today's Date: 11/18/2015   History of Present Illness  65 yo male admitted with hepatic encephalopathy. hx of chronic diarrhea, cirrhosis, DM, HTN, CHF, morbid obesity, CKD  Clinical Impression  On eval, pt required +3 assist to stand and +2 assist to pivot from recliner to bed. Pt was in recliner, unable to stand with +2 assist. Allowed pt to pull up on back of recliner for leverage in order to get to standing. Pt appeared drowsy and required increased time to follow commands. Discussed d/c plan with wife during session. Wife would like for pt to be able to return home. She feels he will be okay once his cognition clears up a bit more. Will follow and continue to assess.     Follow Up Recommendations SNF;Home health PT;Supervision/Assistance - 24 hour (depending on progress)    Equipment Recommendations  None recommended by PT    Recommendations for Other Services       Precautions / Restrictions Precautions Precautions: Fall Restrictions Weight Bearing Restrictions: No      Mobility  Bed Mobility Overal bed mobility: Needs Assistance Bed Mobility: Sit to Supine       Sit to supine: Mod assist;+2 for physical assistance;+2 for safety/equipment   General bed mobility comments: Assist for trunk and bil LEs.   Transfers Overall transfer level: Needs assistance   Transfers: Sit to/from Stand;Stand Pivot Transfers   Stand pivot transfers: Mod assist;+2 physical assistance;+2 safety/equipment       General transfer comment: +3 assist to rise. +2 assist for stand pivot, recliner to bed. Pt pulled up on back of recliner in order to get enough leverage to get to standing. Pt was unable to stand from recliner with +2 and RW.   Ambulation/Gait             General Gait Details: NT-unable to safely attempt at this time  Stairs            Wheelchair Mobility     Modified Rankin (Stroke Patients Only)       Balance Overall balance assessment: Needs assistance;History of Falls         Standing balance support: During functional activity Standing balance-Leahy Scale: Poor                               Pertinent Vitals/Pain Pain Assessment: No/denies pain    Home Living Family/patient expects to be discharged to:: Private residence Living Arrangements: Spouse/significant other Available Help at Discharge: Family Type of Home: House Home Access: Ramped entrance     Home Layout: One level Home Equipment: Cane - single point;Tub bench;Hand held shower head;Walker - 2 wheels      Prior Function Level of Independence: Independent with assistive device(s)         Comments: Uses [employer], 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 and the office is on his home property, does not drive     Hand Dominance        Extremity/Trunk Assessment   Upper Extremity Assessment: Generalized weakness           Lower Extremity Assessment: Generalized weakness      Cervical / Trunk Assessment: Normal  Communication   Communication: No difficulties  Cognition Arousal/Alertness: Awake/alert (drowsy) Behavior During Therapy: WFL for tasks assessed/performed Overall Cognitive Status: Difficult to assess  General Comments      Exercises        Assessment/Plan    PT Assessment Patient needs continued PT services  PT Diagnosis Difficulty walking;Generalized weakness;Altered mental status   PT Problem List Decreased strength;Decreased activity tolerance;Decreased balance;Decreased mobility;Obesity;Decreased knowledge of use of DME  PT Treatment Interventions DME instruction;Gait training;Functional mobility training;Patient/family education;Therapeutic activities;Balance training;Therapeutic exercise   PT Goals (Current goals can be found in the Care Plan section) Acute  Rehab PT Goals Patient Stated Goal: per wife, for pt to be able to return home PT Goal Formulation: With family Time For Goal Achievement: 12/02/15 Potential to Achieve Goals: Good    Frequency Min 3X/week   Barriers to discharge        Co-evaluation               End of Session Equipment Utilized During Treatment: Gait belt Activity Tolerance: Patient limited by fatigue (Limited by drowsiness) Patient left: in bed;with call bell/phone within reach;with bed alarm set;with family/visitor present           Time: 1057-1105 PT Time Calculation (min) (ACUTE ONLY): 8 min   Charges:   PT Evaluation $PT Eval Low Complexity: 1 Procedure     PT G Codes:        Weston Anna, MPT Pager: [phone]

## 2015-11-18 NOTE — Progress Notes (Signed)
OT  Note  Patient Details Name: Peter Larson MRN: [id number] DOB: 05-11-1951   Cancelled Treatment:    Reason Eval/Treat Not Completed: Other (comment)  Spoke with PT and noted pt needed signficant A ( 3 person transfer). Will check on pt for OT eval next day or as pt able to perform ADL activity [place], [place] Parkin 11/18/2015, 11:34 AM

## 2015-11-19 ENCOUNTER — Inpatient Hospital Stay (HOSPITAL_COMMUNITY): Payer: 59

## 2015-11-19 LAB — CBC
HEMATOCRIT: 27.9 % — AB (ref 39.0–52.0)
HEMOGLOBIN: 9.5 g/dL — AB (ref 13.0–17.0)
MCH: 31 pg (ref 26.0–34.0)
MCHC: 34.1 g/dL (ref 30.0–36.0)
MCV: 91.2 fL (ref 78.0–100.0)
Platelets: 47 10*3/uL — ABNORMAL LOW (ref 150–400)
RBC: 3.06 MIL/uL — AB (ref 4.22–5.81)
RDW: 16.4 % — ABNORMAL HIGH (ref 11.5–15.5)
WBC: 3.9 10*3/uL — AB (ref 4.0–10.5)

## 2015-11-19 LAB — BODY FLUID CELL COUNT WITH DIFFERENTIAL
LYMPHS FL: 16 %
MONOCYTE-MACROPHAGE-SEROUS FLUID: 81 % (ref 50–90)
NEUTROPHIL FLUID: 3 % (ref 0–25)
WBC FLUID: 260 uL (ref 0–1000)

## 2015-11-19 LAB — BASIC METABOLIC PANEL
Anion gap: 5 (ref 5–15)
BUN: 58 mg/dL — AB (ref 6–20)
CHLORIDE: 105 mmol/L (ref 101–111)
CO2: 24 mmol/L (ref 22–32)
Calcium: 9.2 mg/dL (ref 8.9–10.3)
Creatinine, Ser: 1.87 mg/dL — ABNORMAL HIGH (ref 0.61–1.24)
GFR calc non Af Amer: 36 mL/min — ABNORMAL LOW (ref >60)
GFR, EST AFRICAN AMERICAN: 42 mL/min — AB (ref >60)
Glucose, Bld: 86 mg/dL (ref 65–99)
POTASSIUM: 5.4 mmol/L — AB (ref 3.5–5.1)
SODIUM: 134 mmol/L — AB (ref 135–145)

## 2015-11-19 MED ORDER — SPIRONOLACTONE 100 MG PO TABS
100.0000 mg | ORAL_TABLET | Freq: Every day | ORAL | Status: DC
  Administered 2015-11-19 – 2015-11-20 (×2): 100 mg via ORAL
  Filled 2015-11-19 (×2): qty 1

## 2015-11-19 MED ORDER — CETYLPYRIDINIUM CHLORIDE 0.05 % MT LIQD
7.0000 mL | Freq: Two times a day (BID) | OROMUCOSAL | Status: DC
  Administered 2015-11-19 – 2015-11-21 (×4): 7 mL via OROMUCOSAL

## 2015-11-19 MED ORDER — FUROSEMIDE 40 MG PO TABS
40.0000 mg | ORAL_TABLET | Freq: Every day | ORAL | Status: DC
  Administered 2015-11-19 – 2015-11-21 (×3): 40 mg via ORAL
  Filled 2015-11-19 (×3): qty 1

## 2015-11-19 NOTE — Progress Notes (Signed)
S/P US guided diagnostic and therapeutic R thoracentesis. Patient denies pain, in bed sleeping. Vital signs obtained. Will continue to monitor.

## 2015-11-19 NOTE — Progress Notes (Signed)
Physical Therapy Treatment Patient Details Name: Peter Larson MRN: [id number] DOB: 10/15/1950 Today's Date: 11/19/2015    History of Present Illness 65 yo male admitted with hepatic encephalopathy. hx of chronic diarrhea, cirrhosis, DM, HTN, CHF, morbid obesity, CKD    PT Comments    Improvement noted with pt more alert this date and requiring decreased but still significant assist to stand from chair.  Follow Up Recommendations  SNF;Home health PT;Supervision/Assistance - 24 hour     Equipment Recommendations  None recommended by PT    Recommendations for Other Services       Precautions / Restrictions Precautions Precautions: Fall Restrictions Weight Bearing Restrictions: No    Mobility  Bed Mobility Overal bed mobility: Needs Assistance;+2 for physical assistance Bed Mobility: Sit to Supine       Sit to supine: Mod assist;+2 for physical assistance;+2 for safety/equipment   General bed mobility comments: Assist for trunk and bil LEs.   Transfers Overall transfer level: Needs assistance Equipment used: Rolling walker (2 wheeled) Transfers: Sit to/from Stand Sit to Stand: Mod assist;+2 physical assistance;+2 safety/equipment Stand pivot transfers: Mod assist;+2 physical assistance;+2 safety/equipment       General transfer comment: Cues and assist to scoot to proper transition position.  Cues to pull LEs under and for use of UEs to push up from chair.  Ambulation/Gait Ambulation/Gait assistance: Mod assist;+2 physical assistance Ambulation Distance (Feet): 2 Feet Assistive device: Rolling walker (2 wheeled) Gait Pattern/deviations: Step-to pattern;Decreased step length - right;Decreased step length - left;Shuffle Gait velocity: decr Gait velocity interpretation: Below normal speed for age/gender General Gait Details: Pt stood from chair, performed stand pvt with RW and stepped backwards 2' with RW and mod assist to sit on EOB   Stairs             Wheelchair Mobility    Modified Rankin (Stroke Patients Only)       Balance Overall balance assessment: Needs assistance Sitting-balance support: No upper extremity supported;Feet supported Sitting balance-Leahy Scale: Good     Standing balance support: Bilateral upper extremity supported Standing balance-Leahy Scale: Poor                      Cognition Arousal/Alertness: Awake/alert Behavior During Therapy: WFL for tasks assessed/performed Overall Cognitive Status: Impaired/Different from baseline Area of Impairment: Following commands       Following Commands: Follows one step commands inconsistently       General Comments: Pt impulsive and requiring repeated verbal and tatile cueing to follow through with tasks    Exercises      General Comments        Pertinent Vitals/Pain Pain Assessment: No/denies pain    Home Living                      Prior Function            PT Goals (current goals can now be found in the care plan section) Acute Rehab PT Goals Patient Stated Goal: per wife, for pt to be able to return home PT Goal Formulation: With family Time For Goal Achievement: 12/02/15 Potential to Achieve Goals: Good Progress towards PT goals: Progressing toward goals    Frequency  Min 3X/week    PT Plan Current plan remains appropriate    Co-evaluation             End of Session Equipment Utilized During Treatment: Gait belt Activity Tolerance: Patient limited by fatigue Patient  left: in bed;with call bell/phone within reach;with bed alarm set;with family/visitor present;with nursing/sitter in room     Time: 1100-1110 PT Time Calculation (min) (ACUTE ONLY): 10 min  Charges:  $Therapeutic Activity: 8-22 mins                    G Codes:      Peter Larson 12/12/15, 12:44 PM

## 2015-11-19 NOTE — Procedures (Signed)
Ultrasound-guided diagnostic and therapeutic right thoracentesis performed yielding 1.5 liters of yellow colored fluid.  Procedure terminated at 1.5L given this was his first thoracentesis. No immediate complications. Follow-up chest x-ray pending.       Jia Dottavio E 3:03 PM 11/19/2015

## 2015-11-19 NOTE — Progress Notes (Signed)
PROGRESS NOTE    Peter Larson  [id number] DOB: 1951-02-22 DOA: 11/16/2015 PCP: [name], MD   Brief Narrative:  Peter Larson is a 65 year old gentleman with a past medical history of nonalcoholic fatty liver disease with cirrhosis, having history hepatic encephalopathy, with multiple hospitalizations this year for encephalopathy. He is currently being seen at [facility]. He was admitted on 11/16/2015, presented with complaints of worsening confusion and a generalized weakness. His ref reported patient having a gradual decline over the past 3 months becoming increasingly weaker and having several falls.   Assessment & Plan:   Principal Problem:   Hepatic encephalopathy (HCC) Active Problems:   Essential hypertension, benign   Chronic diastolic congestive heart failure, NYHA class 1 (HCC)   CKD (chronic kidney disease), stage III   Liver cirrhosis secondary to NASH   AKI (acute kidney injury) ([place])   Acute renal failure superimposed on stage 3 chronic kidney disease ([place])  1.  Hepatic encephalopathy -Peter Larson is a pleasant 65 year old gentleman with a history of advanced liver disease, admitted for probable hepatic encephalopathy. -Initial lab work showing an ammonia level of 110 -He is being treated with rifaximin 515 g by mouth twice a day along with lactulose 15 g by mouth 3 times a day -Lab work on 11/18/2015 showing downward trend in ammonia level to 40. Clinically he is showing improvement, he was assisted out of bed to chair -On 11/19/2014 he continues to show clinical improvement, ambulated out to the hallway with walker  2.  Functional decline/failure to thrive -Peter Larson wife reporting that he has had a progressive decline over the past 3 months, becoming progressively weaker having several falls at home. At one point she had to call paramedics to help get him back up. -I suspect related to advancing liver disease. Dehydration could also be  contributed factor. -On my assessment on 11/18/2015 he showed clinical improvement, assisted out of bed to chair. Physical therapy consulted. -On 11/19/2015 he remains weak, required assist getting out of bed. May benefit from SNF placement  3  Pleural effusion  -He seems to be having ongoing SOB and I wonder if this is limiting his activity -CXR showing right sided pleural effusion  -Will order an IR consult for therapeutic thoracentesis -His pleural effusion likely secondary to cirrhosis  3.  Acute on chronic renal failure -Presented with creatinine of 2.39, baseline creatinine appears to flexure between 1.5 and 1.8. -Diuretics were held on admission -Labs showing downward trend in Cr to 1.87  4.  Hypertension. -Blood pressure stable, he had been on metoprolol however nursing staff reported that overnight he had bradycardia with heart rates in the 40s -Having bradycardia overnight to think beta blocker therapy should be stopped --Started Norvasc10 mg PO q daily  5.  Cirrhosis -Secondary to nonalcoholic liver disease  -Admitted for acute hepatic encephalopathy -Plan to restart diuretics today with improvement to his renal function -Had follow up at [facility] center  DVT prophylaxis: SCD's Code Status: Full Code Family Communication: I spoke with his wife over telephone Disposition Plan: Anticipate discharge in the next 24-48 hours  Consultants:     Procedures:    Subjective: He reports ongoing SOB, he was assisted out of bed, got winded ambulating to hallway  Objective: Filed Vitals:   11/18/15 1111 11/18/15 1529 11/18/15 2036 11/19/15 0620  BP: 151/58 143/61 146/64 132/55  Pulse: 58 64 65 64  Temp: 98.2 F (36.8 C) 97.4 F (36.3 C) 98.1  F (36.7 C)   TempSrc: Oral Oral Oral   Resp: 20 20 20 20   Height: 6\' 2"  (1.88 m)     Weight: 141.976 kg (313 lb)     SpO2: 95% 95% 97% 97%    Intake/Output Summary (Last 24 hours) at 11/19/15 1222 Last data  filed at 11/19/15 1135  Gross per 24 hour  Intake    240 ml  Output    900 ml  Net   -660 ml   Filed Weights   11/16/15 1621 11/17/15 0400 11/18/15 1111  Weight: 142.1 kg (313 lb 4.4 oz) 143.8 kg (317 lb 0.3 oz) 141.976 kg (313 lb)    Examination:  General exam: Awake and alert interactive Respiratory system: Coarse respiratory sounds bibasilar crackles Cardiovascular system: S1 & S2 heard, RRR. No JVD, murmurs, rubs, gallops or clicks. No pedal edema. Gastrointestinal system: Abdomen is distended, he did not have similar complaint with palpation. No rebound tenderness or guarding. Central nervous system: Alert and oriented. No focal neurological deficits. Extremities: Symmetric 5 x 5 power. Skin: No rashes, lesions or ulcers Psychiatry: Patient appearing to be confused, disoriented    Data Reviewed: I have personally reviewed following labs and imaging studies  CBC:  Recent Labs Lab 11/16/15 0919 11/17/15 0309 11/19/15 0332  WBC 5.6 5.5 3.9*  HGB 11.9* 10.0* 9.5*  HCT 34.6* 29.5* 27.9*  MCV 89.2 91.6 91.2  PLT 90* 68* 47*   Basic Metabolic Panel:  Recent Labs Lab 11/16/15 0919 11/16/15 1809 11/17/15 0309 11/18/15 0646 11/19/15 0332  NA 132* 133* 133* 132* 134*  K 5.8* 5.6* 5.6* 5.6* 5.4*  CL 103 105 105 104 105  CO2 20* 17* 22 23 24   GLUCOSE 92 74 75 78 86  BUN 62* 58* 57* 60* 58*  CREATININE 2.39* 2.25* 2.14* 1.99* 1.87*  CALCIUM 9.1 8.9 8.8* 9.1 9.2  MG  --  2.5*  --   --   --   PHOS  --  4.5  --   --   --    GFR: Estimated Creatinine Clearance: 59.9 mL/min (by C-G formula based on Cr of 1.87). Liver Function Tests:  Recent Labs Lab 11/16/15 0919 11/17/15 0309  AST 55* 45*  ALT 43 34  ALKPHOS 126 95  BILITOT 3.6* 3.4*  PROT 6.3* 5.2*  ALBUMIN 3.0* 2.8*   No results for input(s): LIPASE, AMYLASE in the last 168 hours.  Recent Labs Lab 11/16/15 0922 11/18/15 0646  AMMONIA 89* 40*   Coagulation Profile:  Recent Labs Lab  11/16/15 1809  INR 1.74*   Cardiac Enzymes: No results for input(s): CKTOTAL, CKMB, CKMBINDEX, TROPONINI in the last 168 hours. BNP (last 3 results) No results for input(s): PROBNP in the last 8760 hours. HbA1C: No results for input(s): HGBA1C in the last 72 hours. CBG:  Recent Labs Lab 11/16/15 0952  GLUCAP 86   Lipid Profile: No results for input(s): CHOL, HDL, LDLCALC, TRIG, CHOLHDL, LDLDIRECT in the last 72 hours. Thyroid Function Tests: No results for input(s): TSH, T4TOTAL, FREET4, T3FREE, THYROIDAB in the last 72 hours. Anemia Panel: No results for input(s): VITAMINB12, FOLATE, FERRITIN, TIBC, IRON, RETICCTPCT in the last 72 hours. Sepsis Labs: No results for input(s): PROCALCITON, LATICACIDVEN in the last 168 hours.  Recent Results (from the past 240 hour(s))  MRSA PCR Screening     Status: None   Collection Time: 11/16/15  4:15 PM  Result Value Ref Range Status   MRSA by PCR NEGATIVE NEGATIVE Final  Comment:        The GeneXpert MRSA Assay (FDA approved for NASAL specimens only), is one component of a comprehensive MRSA colonization surveillance program. It is not intended to diagnose MRSA infection nor to guide or monitor treatment for MRSA infections.          Radiology Studies: Dg Chest Port 1 View  11/19/2015  CLINICAL DATA:  Shortness of breath EXAM: PORTABLE CHEST 1 VIEW COMPARISON:  11/17/2015 chest radiograph. FINDINGS: Stable cardiomediastinal silhouette with normal heart size. No pneumothorax. Moderate right pleural effusion, not appreciably changed. No left pleural effusion. Patchy opacity throughout the right lung, not appreciably changed. No acute consolidative airspace disease in the left lung. IMPRESSION: Stable moderate right pleural effusion and patchy opacity throughout the right lung, favoring atelectasis, with a component of pneumonia or underlying lung mass not excluded. Electronically Signed   By: Ilona Sorrel M.D.   On: 11/19/2015  11:29        Scheduled Meds: . citalopram  10 mg Oral Daily  . furosemide  40 mg Oral Daily  . lactulose  15 g Oral TID  . multivitamin with minerals  1 tablet Oral q morning - 10a  . pantoprazole  40 mg Oral Daily  . rifaximin  550 mg Oral BID  . spironolactone  100 mg Oral Daily  . zinc sulfate  220 mg Oral Daily   Continuous Infusions:     LOS: 3 days    Time spent: 35 minutes    Kelvin Cellar, MD [facility] Pager [phone]  If 7PM-7AM, please contact night-coverage [email or web address] Password [facility] 11/19/2015, 12:22 PM

## 2015-11-19 NOTE — Progress Notes (Signed)
OT Cancellation Note  Patient Details Name: Peter Larson MRN: [id number] DOB: 1950/07/06   Cancelled Treatment:    Reason Eval/Treat Not Completed: Patient at procedure or test/ unavailable  Pt going for xray- will check on later in day or next day   Carlyon Shadow, [place] 11/19/2015, 11:17 AM

## 2015-11-19 NOTE — Clinical Social Work Note (Signed)
Clinical Social Work Assessment  Patient Details  Name: Peter Larson MRN: [id number] Date of Birth: 1950/11/17  Date of referral:  11/19/15               Reason for consult:  Facility Placement, Discharge Planning                Permission sought to share information with:  Chartered certified accountant granted to share information::  Yes, Verbal Permission Granted  Name::        Agency::     Relationship::     Contact Information:     Housing/Transportation Living arrangements for the past 2 months:  Single Family Home Source of Information:  Spouse Patient Interpreter Needed:  None Criminal Activity/Legal Involvement Pertinent to Current Situation/Hospitalization:  No - Comment as needed Significant Relationships:  Adult Children, Spouse Lives with:  Spouse Do you feel safe going back to the place where you live?   (SNF recommended.) Need for family participation in patient care:  Yes (Comment)  Care giving concerns:  Pt may require more care than is available at home at time of d/c.   Social Worker assessment / plan:  Pt hospitalized on 11/16/15 with renal failure. PN reviewed. PT recommends SNF vs HHPT depending on progression. CSW met with spouse to review PT recommendations. Pt was sleeping during visit. Spouse is in agreement with SNF search but hopes pt will be able to return home at d/c. SNF search has been initiated and bed offers pending. CSW will continue to follow to assist with d/c planning to SNF.  Employment status:  Therapist, music:  Managed Care PT Recommendations:  [place] / Referral to community resources:  [name]  Patient/Family's Response to care:  Pt / spouse will accept ST rehab if needed but prefer d/c home.  Patient/Family's Understanding of and Emotional Response to Diagnosis, Current Treatment, and Prognosis:  Spouse is aware of pt's medical status. Spouse is hopeful pt will  progress with PT and be able to return home at d/c, but will accept ST rehab if needed.  Emotional Assessment Appearance:  Appears stated age Attitude/Demeanor/Rapport:  Other (cooperative) Affect (typically observed):  Unable to Assess Orientation:  Oriented to Self, Oriented to Situation, Oriented to Place Alcohol / Substance use:  Not Applicable Psych involvement (Current and /or in the community):  No (Comment)  Discharge Needs  Concerns to be addressed:  Discharge Planning Concerns Readmission within the last 30 days:  No Current discharge risk:  None Barriers to Discharge:  No Barriers Identified   Peter Larson  [phone] 11/19/2015, 4:26 PM

## 2015-11-19 NOTE — Progress Notes (Signed)
MD aware of CXR results. 

## 2015-11-19 NOTE — Clinical Social Work Placement (Addendum)
   CLINICAL SOCIAL WORK PLACEMENT  NOTE  Date:  11/19/2015  Patient Details  Name: Peter Larson MRN: [id number] Date of Birth: 1950/09/16  Clinical Social Work is seeking post-discharge placement for this patient at the [place] level of care (*CSW will initial, date and re-position this form in  chart as items are completed):  Yes   Patient/family provided with [place] Work Department's list of facilities offering this level of care within the geographic area requested by the patient (or if unable, by the patient's family).  Yes   Patient/family informed of their freedom to choose among providers that offer the needed level of care, that participate in Medicare, Medicaid or managed care program needed by the patient, have an available bed and are willing to accept the patient.  Yes   Patient/family informed of 's ownership interest in [facility] and [facility], as well as of the fact that they are under no obligation to receive care at these facilities.  PASRR submitted to EDS on 11/19/15     PASRR number received on 11/19/15     Existing PASRR number confirmed on       FL2 transmitted to all facilities in geographic area requested by pt/family on 11/19/15     FL2 transmitted to all facilities within larger geographic area on       Patient informed that his/her managed care company has contracts with or will negotiate with certain facilities, including the following:         11-20-15   Patient/family informed of bed offers received. (updated 11-21-15 Evette Cristal, MSW, [facility] Presser)  Patient chooses bed at  [facility] (updated 11-21-15 Evette Cristal, MSW, [facility])     Physician recommends and patient chooses bed at      Patient to be transferred to  [facility] on  11-21-15 (updated 11-21-15 Evette Cristal, MSW, LCSWA).  Patient to be transferred to facility by  Patient's wife's car (updated 11-21-15 Evette Cristal, MSW, LCSWA)     Patient family notified on  11-21-15 of transfer. (updated 11-21-15 Evette Cristal, MSW, [place])  Name of family member notified:   Paris Barb, patient's wife (updated 11-21-15 Evette Cristal, MSW, [place])     PHYSICIAN       Additional Comment:    _______________________________________________ [name]  [phone] 11/19/2015, 4:36 PM   Jones Broom. Norval Morton, MSW, [place] 11/21/2015 11:57 AM (updated 11-21-15 Evette Cristal, MSW, LCSWA)

## 2015-11-19 NOTE — NC FL2 (Signed)
[place] LEVEL OF CARE SCREENING TOOL     IDENTIFICATION  Patient Name: Peter Larson Birthdate: October 03, 1950 Sex: male Admission Date (Current Location): 11/16/2015  [facility] and [place] Number:  Herbalist and Address:  [facility],  [place] [address] [place]      Provider Number: [id number]  Attending Physician Name and Address:  Kelvin Cellar, MD  Relative Name and Phone Number:       Current Level of Care: Hospital Recommended Level of Care: [place] Prior Approval Number:    Date Approved/Denied:   PASRR Number: [id number]  Discharge Plan: SNF    Current Diagnoses: Patient Active Problem List   Diagnosis Date Noted  . Encephalopathy 11/16/2015  . AKI (acute kidney injury) ([place]) 09/03/2015  . Watery diarrhea 09/03/2015  . Acute renal failure superimposed on stage 3 chronic kidney disease ([place])   . Diarrhea   . Chronic diastolic CHF (congestive heart failure) ([place])   . Pleural effusion, right   . Moderate mitral regurgitation 08/31/2015  . Acute on chronic diastolic CHF (congestive heart failure), NYHA class 1 ([place]) 08/31/2015  . Pleural effusion on right 07/01/2015  . CHF exacerbation ([place]) 07/01/2015  . Hepatic encephalopathy ([place]) 02/23/2014  . CKD (chronic kidney disease), stage III   . Morbid obesity ([place])   . Frequent PVCs   . Diabetes mellitus without complication ([place])   . Liver cirrhosis secondary to NASH   . Chronic diastolic congestive heart failure, NYHA class 1 ([place]) 01/30/2014  . SOB (shortness of breath) 01/29/2014  . OSA (obstructive sleep apnea) 07/16/2013  . Essential hypertension, benign 07/16/2013  . PVC (premature ventricular contraction) 07/16/2013    Orientation RESPIRATION BLADDER Height & Weight     Self, Situation, Place  O2 Incontinent Weight: (!) 141.976 kg (313 lb) Height:  6\' 2"  (188 cm)  BEHAVIORAL SYMPTOMS/MOOD NEUROLOGICAL BOWEL NUTRITION STATUS  Other  (Comment) (no behaviors)   Continent Diet  AMBULATORY STATUS COMMUNICATION OF NEEDS Skin   Extensive Assist Verbally Normal                       Personal Care Assistance Level of Assistance  Bathing, Feeding, Dressing Bathing Assistance: Maximum assistance Feeding assistance: Independent Dressing Assistance: Maximum assistance     Functional Limitations Info  Sight, Hearing, Speech Sight Info: Adequate Hearing Info: Adequate Speech Info: Adequate    SPECIAL CARE FACTORS FREQUENCY  PT (By licensed PT), OT (By licensed OT)     PT Frequency: 5 x wk OT Frequency: 5 x wk            Contractures Contractures Info: Not present    Additional Factors Info  Code Status Code Status Info: Full Code             Current Medications (11/19/2015):  This is the current hospital active medication list Current Facility-Administered Medications  Medication Dose Route Frequency Provider Last Rate Last Dose  . citalopram (CELEXA) tablet 10 mg  10 mg Oral Daily Bonnell Public, MD   10 mg at 11/19/15 1014  . furosemide (LASIX) tablet 40 mg  40 mg Oral Daily Kelvin Cellar, MD   40 mg at 11/19/15 1021  . lactulose (CHRONULAC) 10 GM/15ML solution 15 g  15 g Oral TID Bonnell Public, MD   15 g at 11/19/15 1555  . multivitamin with minerals tablet 1 tablet  1 tablet Oral q morning - 10a Babs Bertin  Marthenia Rolling, MD   1 tablet at 11/19/15 1013  . pantoprazole (PROTONIX) EC tablet 40 mg  40 mg Oral Daily Bonnell Public, MD   40 mg at 11/19/15 1014  . rifaximin (XIFAXAN) tablet 550 mg  550 mg Oral BID Bonnell Public, MD   550 mg at 11/19/15 1014  . spironolactone (ALDACTONE) tablet 100 mg  100 mg Oral Daily Kelvin Cellar, MD   100 mg at 11/19/15 1021  . zinc sulfate capsule 220 mg  220 mg Oral Daily Bonnell Public, MD   220 mg at 11/19/15 1013     Discharge Medications: Please see discharge summary for a list of discharge medications.  Relevant Imaging  Results:  Relevant Lab Results:   Additional Information SS # [id number]  Jaqlyn Gruenhagen, Randall An, LCSW

## 2015-11-20 LAB — BASIC METABOLIC PANEL WITH GFR
Anion gap: 4 — ABNORMAL LOW (ref 5–15)
BUN: 50 mg/dL — ABNORMAL HIGH (ref 6–20)
CO2: 24 mmol/L (ref 22–32)
Calcium: 9.1 mg/dL (ref 8.9–10.3)
Chloride: 106 mmol/L (ref 101–111)
Creatinine, Ser: 1.66 mg/dL — ABNORMAL HIGH (ref 0.61–1.24)
GFR calc Af Amer: 49 mL/min — ABNORMAL LOW
GFR calc non Af Amer: 42 mL/min — ABNORMAL LOW
Glucose, Bld: 82 mg/dL (ref 65–99)
Potassium: 5.1 mmol/L (ref 3.5–5.1)
Sodium: 134 mmol/L — ABNORMAL LOW (ref 135–145)

## 2015-11-20 LAB — CBC
HEMATOCRIT: 28.3 % — AB (ref 39.0–52.0)
HEMOGLOBIN: 9.6 g/dL — AB (ref 13.0–17.0)
MCH: 31.7 pg (ref 26.0–34.0)
MCHC: 33.9 g/dL (ref 30.0–36.0)
MCV: 93.4 fL (ref 78.0–100.0)
Platelets: 52 10*3/uL — ABNORMAL LOW (ref 150–400)
RBC: 3.03 MIL/uL — ABNORMAL LOW (ref 4.22–5.81)
RDW: 16.5 % — ABNORMAL HIGH (ref 11.5–15.5)
WBC: 3.7 10*3/uL — ABNORMAL LOW (ref 4.0–10.5)

## 2015-11-20 LAB — GRAM STAIN

## 2015-11-20 MED ORDER — SPIRONOLACTONE 50 MG PO TABS: 50.0000 mg | ORAL_TABLET | Freq: Every day | ORAL | Status: DC

## 2015-11-20 MED ORDER — SPIRONOLACTONE 25 MG PO TABS
50.0000 mg | ORAL_TABLET | Freq: Every day | ORAL | Status: DC
  Administered 2015-11-21: 50 mg via ORAL
  Filled 2015-11-20: qty 2

## 2015-11-20 NOTE — Progress Notes (Signed)
Physical Therapy Treatment Patient Details Name: Peter Larson MRN: [id number] DOB: 10-21-1950 Today's Date: 11/20/2015    History of Present Illness 65 yo male admitted with hepatic encephalopathy. hx of chronic diarrhea, cirrhosis, DM, HTN, CHF, morbid obesity, CKD    PT Comments    Marked improvement in activity tolerance and ability to focus on task  Follow Up Recommendations  SNF;Home health PT;Supervision/Assistance - 24 hour     Equipment Recommendations  None recommended by PT    Recommendations for Other Services       Precautions / Restrictions Precautions Precautions: Fall Restrictions Weight Bearing Restrictions: No    Mobility  Bed Mobility               General bed mobility comments: in chair on arrival  Transfers Overall transfer level: Needs assistance Equipment used: Rolling walker (2 wheeled) Transfers: Sit to/from Stand Sit to Stand: +2 physical assistance;Min assist         General transfer comment: cues for hand placement and anterior weight shift to help with power up into standing.   Ambulation/Gait Ambulation/Gait assistance: Min assist;+2 physical assistance;+2 safety/equipment Ambulation Distance (Feet): 36 Feet Assistive device: Rolling walker (2 wheeled) Gait Pattern/deviations: Step-through pattern;Decreased step length - right;Decreased step length - left;Shuffle;Trunk flexed;Wide base of support Gait velocity: decr Gait velocity interpretation: Below normal speed for age/gender General Gait Details: cues for posture, position from RW and assist to manage RW and for balance/stability   Stairs            Wheelchair Mobility    Modified Rankin (Stroke Patients Only)       Balance Overall balance assessment: Needs assistance Sitting-balance support: Bilateral upper extremity supported;Feet supported Sitting balance-Leahy Scale: Good     Standing balance support: During functional activity Standing  balance-Leahy Scale: Poor                      Cognition Arousal/Alertness: Awake/alert Behavior During Therapy: WFL for tasks assessed/performed Overall Cognitive Status: Impaired/Different from baseline Area of Impairment: Safety/judgement;Problem solving       Following Commands: Follows one step commands inconsistently Safety/Judgement: Decreased awareness of deficits;Decreased awareness of safety Awareness: Emergent   General Comments: pt needs cues to sequence task and demonstrates some delay. pt with executive functioning deficits noted with adls    Exercises      General Comments        Pertinent Vitals/Pain Pain Assessment: No/denies pain    Home Living Family/patient expects to be discharged to:: Skilled nursing facility                    Prior Function Level of Independence: Independent with assistive device(s)      Comments: PT using SPC and x2 falls week of admission. pt owns his own [employer]. pt does not drive. pt has family that live on the same road as wife and wife still works full time   PT Goals (current goals can now be found in the care plan section) Acute Rehab PT Goals Patient Stated Goal: to return to work PT Goal Formulation: With family Time For Goal Achievement: 12/02/15 Potential to Achieve Goals: Good Progress towards PT goals: Progressing toward goals    Frequency  Min 3X/week    PT Plan Current plan remains appropriate    Co-evaluation PT/OT/SLP Co-Evaluation/Treatment: Yes Reason for Co-Treatment: For patient/therapist safety PT goals addressed during session: Mobility/safety with mobility OT goals addressed during session: ADL's and  self-care     End of Session Equipment Utilized During Treatment: Gait belt Activity Tolerance: Patient limited by fatigue Patient left: in chair;with call bell/phone within reach;with chair alarm set;with family/visitor present     Time: [id number] PT Time  Calculation (min) (ACUTE ONLY): 29 min  Charges:  $Gait Training: 8-22 mins                    G Codes:      Amore Ackman 11-21-2015, 12:39 PM

## 2015-11-20 NOTE — Progress Notes (Signed)
[facility] ( [facility] ) is able to accept pt on Sunday if stable for d/c. Weekend CSW will assist with d/c planning to SNF. Pt / spouse have been updated.  Werner Lean LCSW [phone]

## 2015-11-20 NOTE — Progress Notes (Signed)
PROGRESS NOTE    REMONE SAPP  [id number] DOB: 1950/07/22 DOA: 11/16/2015 PCP: [name], MD   Brief Narrative:  Mr. Bayerl is a 65 year old gentleman with a past medical history of nonalcoholic fatty liver disease with cirrhosis, having history hepatic encephalopathy, with multiple hospitalizations this year for encephalopathy. He is currently being seen at [facility]. He was admitted on 11/16/2015, presented with complaints of worsening confusion and a generalized weakness. His ref reported patient having a gradual decline over the past 3 months becoming increasingly weaker and having several falls.   Assessment & Plan:   Principal Problem:   Hepatic encephalopathy (HCC) Active Problems:   Essential hypertension, benign   Chronic diastolic congestive heart failure, NYHA class 1 (HCC)   CKD (chronic kidney disease), stage III   Liver cirrhosis secondary to NASH   AKI (acute kidney injury) ([place])   Acute renal failure superimposed on stage 3 chronic kidney disease ([place])  1.  Hepatic encephalopathy -Mr. Gikas is a pleasant 65 year old gentleman with a history of advanced liver disease, admitted for probable hepatic encephalopathy. -Initial lab work showing an ammonia level of 40 -He is being treated with rifaximin 515 g by mouth twice a day along with lactulose 15 g by mouth 3 times a day -Lab work on 11/18/2015 showing downward trend in ammonia level to 40. Clinically he is showing improvement, he was assisted out of bed to chair  2.  Functional decline/failure to thrive -Mr. Rabold's wife reporting that he has had a progressive decline over the past 3 months, becoming progressively weaker having several falls at home. At one point she had to call paramedics to help get him back up. -I suspect related to advancing liver disease. Dehydration could also be contributed factor. -On my assessment on 11/18/2015 he showed clinical improvement, assisted out of bed  to chair. Physical therapy consulted. -On 11/20/2015 he remains weak, requiring 2 person assist to get him out of bed to the chair. -I spoke to social work, plan for him to be discharged to [facility] in the next 24-48 hours  3  Pleural effusion  -He seems to be having ongoing SOB and I wonder if this is limiting his activity -CXR showing right sided pleural effusion  -On 11/19/2015 he underwent ultrasound-guided thoracentesis with removal of 1.5 L of fluid. He reported improvement to his breathing post procedure. His postprocedure chest x-ray showed improvement without evidence for pneumothorax.  3.  Acute on chronic renal failure -Presented with creatinine of 2.39, baseline creatinine appears to flexure between 1.5 and 1.8. -Diuretics were held on admission -Labs showing downward trend in Cr to 1.66, coming down from 2.39 on admission  4.  Hypertension. -Blood pressure stable, he had been on metoprolol however nursing staff reported that overnight he had bradycardia with heart rates in the 40s -Having bradycardia overnight to think beta blocker therapy should be stopped --Started Norvasc10 mg PO q daily  5.  Cirrhosis -Secondary to nonalcoholic liver disease  -Admitted for acute hepatic encephalopathy -Plan to restart diuretics today with improvement to his renal function -On 11/20/2015 lab work showing downward trend in his potassium to 5.1, spironolactone was restarted. Spironolactone dose was changed to 50 mg by mouth daily (he had been on 100 mg by mouth daily in the outpatient setting)  -Will need to follow potassium levels closely -Plan to continue Lasix at 40 mg by mouth daily. -Had follow up at [facility] center  DVT prophylaxis: SCD's Code Status:  Full Code Family Communication: I spoke with his wife over telephone Disposition Plan: Anticipate discharge to SNF next 24-48 hours  Consultants:   Interventional radiology  Procedures:  Ultrasound-guided right-sided  thoracentesis with removal of 1.5 L of fluid, procedure performed on 11/19/2015.  Subjective: Patient reporting feeling a little better today, was assisted out of bed to chair still requiring 2 person assist.  Objective: Filed Vitals:   11/19/15 1426 11/19/15 1500 11/19/15 1542 11/19/15 2030  BP: 136/63 106/56 146/66 126/55  Pulse:   65 70  Temp:   97.8 F (36.6 C) 98.1 F (36.7 C)  TempSrc:   Oral Oral  Resp:   17 18  Height:      Weight:      SpO2:    95%    Intake/Output Summary (Last 24 hours) at 11/20/15 0858 Last data filed at 11/20/15 0856  Gross per 24 hour  Intake    540 ml  Output   1400 ml  Net   -860 ml   Filed Weights   11/16/15 1621 11/17/15 0400 11/18/15 1111  Weight: 142.1 kg (313 lb 4.4 oz) 143.8 kg (317 lb 0.3 oz) 141.976 kg (313 lb)    Examination:  General exam: Awake and alert interactive Respiratory system: Improved air movement to right lung, left lung is clear, normal respiratory effort Cardiovascular system: S1 & S2 heard, RRR. No JVD, murmurs, rubs, gallops or clicks. No pedal edema. Gastrointestinal system: Abdomen is distended, he did not have similar complaint with palpation. No rebound tenderness or guarding. Central nervous system: Alert and oriented. No focal neurological deficits. Extremities: Symmetric 5 x 5 power. Skin: No rashes, lesions or ulcers Psychiatry: Patient appearing to be confused, disoriented    Data Reviewed: I have personally reviewed following labs and imaging studies  CBC:  Recent Labs Lab 11/16/15 0919 11/17/15 0309 11/19/15 0332 11/20/15 0402  WBC 5.6 5.5 3.9* 3.7*  HGB 11.9* 10.0* 9.5* 9.6*  HCT 34.6* 29.5* 27.9* 28.3*  MCV 89.2 91.6 91.2 93.4  PLT 90* 68* 47* 52*   Basic Metabolic Panel:  Recent Labs Lab 11/16/15 1809 11/17/15 0309 11/18/15 0646 11/19/15 0332 11/20/15 0402  NA 133* 133* 132* 134* 134*  K 5.6* 5.6* 5.6* 5.4* 5.1  CL 105 105 104 105 106  CO2 17* 22 23 24 24   GLUCOSE 74 75  78 86 82  BUN 58* 57* 60* 58* 50*  CREATININE 2.25* 2.14* 1.99* 1.87* 1.66*  CALCIUM 8.9 8.8* 9.1 9.2 9.1  MG 2.5*  --   --   --   --   PHOS 4.5  --   --   --   --    GFR: Estimated Creatinine Clearance: 67.5 mL/min (by C-G formula based on Cr of 1.66). Liver Function Tests:  Recent Labs Lab 11/16/15 0919 11/17/15 0309  AST 55* 45*  ALT 43 34  ALKPHOS 126 95  BILITOT 3.6* 3.4*  PROT 6.3* 5.2*  ALBUMIN 3.0* 2.8*   No results for input(s): LIPASE, AMYLASE in the last 168 hours.  Recent Labs Lab 11/16/15 0922 11/18/15 0646  AMMONIA 89* 40*   Coagulation Profile:  Recent Labs Lab 11/16/15 1809  INR 1.74*   Cardiac Enzymes: No results for input(s): CKTOTAL, CKMB, CKMBINDEX, TROPONINI in the last 168 hours. BNP (last 3 results) No results for input(s): PROBNP in the last 8760 hours. HbA1C: No results for input(s): HGBA1C in the last 72 hours. CBG:  Recent Labs Lab 11/16/15 0952  GLUCAP 86  Lipid Profile: No results for input(s): CHOL, HDL, LDLCALC, TRIG, CHOLHDL, LDLDIRECT in the last 72 hours. Thyroid Function Tests: No results for input(s): TSH, T4TOTAL, FREET4, T3FREE, THYROIDAB in the last 72 hours. Anemia Panel: No results for input(s): VITAMINB12, FOLATE, FERRITIN, TIBC, IRON, RETICCTPCT in the last 72 hours. Sepsis Labs: No results for input(s): PROCALCITON, LATICACIDVEN in the last 168 hours.  Recent Results (from the past 240 hour(s))  MRSA PCR Screening     Status: None   Collection Time: 11/16/15  4:15 PM  Result Value Ref Range Status   MRSA by PCR NEGATIVE NEGATIVE Final    Comment:        The GeneXpert MRSA Assay (FDA approved for NASAL specimens only), is one component of a comprehensive MRSA colonization surveillance program. It is not intended to diagnose MRSA infection nor to guide or monitor treatment for MRSA infections.   Culture, body fluid-bottle     Status: None (Preliminary result)   Collection Time: 11/19/15  3:31 PM   Result Value Ref Range Status   Specimen Description FLUID PLEURAL  Final   Special Requests   Final    BOTTLES DRAWN AEROBIC AND ANAEROBIC 5CC Performed at [facility]    Culture PENDING  Incomplete   Report Status PENDING  Incomplete  Gram stain     Status: None   Collection Time: 11/19/15  3:31 PM  Result Value Ref Range Status   Specimen Description FLUID PLEURAL  Final   Special Requests NONE  Final   Gram Stain   Final    RARE WBC PRESENT,BOTH PMN AND MONONUCLEAR NO ORGANISMS SEEN Performed at [facility]    Report Status 11/20/2015 FINAL  Final         Radiology Studies: Dg Chest 1 View  11/19/2015  CLINICAL DATA:  Status post right thoracentesis today. Postprocedural examination. EXAM: CHEST 1 VIEW COMPARISON:  Single-view of the chest earlier today. FINDINGS: Right pleural effusion is decreased after thoracentesis. No pneumothorax is identified. The left lung is clear. No left pleural effusion is identified. There is cardiomegaly. IMPRESSION: Decreased right pleural effusion after thoracentesis. Negative for pneumothorax. Electronically Signed   By: Inge Rise M.D.   On: 11/19/2015 15:21   Dg Chest Port 1 View  11/19/2015  CLINICAL DATA:  Shortness of breath EXAM: PORTABLE CHEST 1 VIEW COMPARISON:  11/17/2015 chest radiograph. FINDINGS: Stable cardiomediastinal silhouette with normal heart size. No pneumothorax. Moderate right pleural effusion, not appreciably changed. No left pleural effusion. Patchy opacity throughout the right lung, not appreciably changed. No acute consolidative airspace disease in the left lung. IMPRESSION: Stable moderate right pleural effusion and patchy opacity throughout the right lung, favoring atelectasis, with a component of pneumonia or underlying lung mass not excluded. Electronically Signed   By: Ilona Sorrel M.D.   On: 11/19/2015 11:29   US Thoracentesis Asp Pleural Space W/img Guide  11/19/2015  INDICATION:  65 year old with fatty liver disease with a right pleural effusion. Request is made today for a diagnostic and therapeutic thoracentesis EXAM: ULTRASOUND GUIDED DIAGNOSTIC AND THERAPEUTIC THORACENTESIS MEDICATIONS: 1% lidocaine COMPLICATIONS: None immediate. PROCEDURE: An ultrasound guided thoracentesis was thoroughly discussed with the patient and his wife and questions answered. The benefits, risks, alternatives and complications were also discussed. The patient's wife understands and wishes to proceed with the procedure. Written consent was obtained. Ultrasound was performed to localize and mark an adequate pocket of fluid in the right chest. The area was then prepped and draped in  the normal sterile fashion. 1% Lidocaine was used for local anesthesia. A Safe-T-Centesis catheter was introduced. Thoracentesis was performed. The catheter was removed and a dressing applied. FINDINGS: A total of approximately 1.5 L of yellow fluid was removed. Samples were sent to the laboratory as requested by the clinical team. IMPRESSION: Successful ultrasound guided right thoracentesis yielding 1.5 L of pleural fluid. Given this was the patient's first thoracentesis, the procedure was terminated at 1.5 L. There was a small residual amount of effusion remaining on ultrasound imaging. Read by: Saverio Danker, PA-C Electronically Signed   By: Marybelle Killings M.D.   On: 11/19/2015 15:52        Scheduled Meds: . antiseptic oral rinse  7 mL Mouth Rinse BID  . citalopram  10 mg Oral Daily  . furosemide  40 mg Oral Daily  . lactulose  15 g Oral TID  . multivitamin with minerals  1 tablet Oral q morning - 10a  . pantoprazole  40 mg Oral Daily  . rifaximin  550 mg Oral BID  . spironolactone  100 mg Oral Daily  . zinc sulfate  220 mg Oral Daily   Continuous Infusions:     LOS: 4 days    Time spent: 30 minutes    Kelvin Cellar, MD [facility] Pager [phone]  If 7PM-7AM, please contact  night-coverage [email or web address] Password [facility] 11/20/2015, 8:58 AM

## 2015-11-20 NOTE — Discharge Summary (Signed)
Physician Discharge Summary  Peter Larson [id number] DOB: 1950/12/28 DOA: 11/16/2015  PCP: Peter Kroner, MD  Admit date: 11/16/2015 Discharge date: 11/21/2015  Time spent: 35 minutes  Recommendations for Outpatient Follow-up:  1. Please repeat a BMP in 4-5 days, he was admitted for acute kidney injury, also having hyperkalemia. Spironolactone dose decreased.  2. During this hospitalization he underwent ultrasound guided thoracentesis of right with removal of 1.5 L of fluid 3. Having functional decline/failure to thrive he was discharged to skilled nursing facility for acute rehabilitation.   Discharge Diagnoses:  Principal Problem:   Hepatic encephalopathy ([place]) Active Problems:   Essential hypertension, benign   Chronic diastolic congestive heart failure, NYHA class 1 (HCC)   CKD (chronic kidney disease), stage III   Liver cirrhosis secondary to NASH   AKI (acute kidney injury) ([place])   Acute renal failure superimposed on stage 3 chronic kidney disease ([place])   Discharge Condition: Stable/improved  Diet recommendation: Low sodium fluid restricted diet  Filed Weights   11/16/15 1621 11/17/15 0400 11/18/15 1111  Weight: 142.1 kg (313 lb 4.4 oz) 143.8 kg (317 lb 0.3 oz) 141.976 kg (313 lb)    History of present illness:  65 year old Caucasian male with history of non alcoholic fatty liver disease with cirrhosis, known to a GI Physician at [facility] and said to be on transplant list. Patient also caries diagnosis of DM, hypertension, diastolic CHF, OSA, morbid obesity and chronic kidney disease. Patient presents with 2 day history of altered mental status and weakness. Patient is not able to give any history due to altered mentation. Patient's ammonia level seems to be at baseline, but patient looks volume depleted. Patient is on lasix and spironolactone. Worsening renal function is noted with elevated potassium. No leukocytosis. No reported fever or chills, no nausea or  vomiting, no diarrhea, no abdominal pain and no urinary symptoms.   Hospital Course:  Peter Larson is a 65 year old gentleman with a past medical history of nonalcoholic fatty liver disease with cirrhosis, having history hepatic encephalopathy, with multiple hospitalizations this year for encephalopathy. He is currently being seen at [facility]. He was admitted on 11/16/2015, presented with complaints of worsening confusion and a generalized weakness. His wife reporting patient having a gradual decline over the past 3 months becoming increasingly weaker and having several falls. Initial lab work revealed the development of acute on chronic renal failure with creatinine of 2.25 with BUN of 58. He also had a potassium of 5.6. Labs also revealed an elevated ammonia level of 89. Diuretics were discontinued as he was given IV fluids. He was also treated with lactulose and rifaximin for hepatic encephalopathy. Ammonia levels trending down to 40. During this hospitalization he also went ultrasound-guided thoracentesis performed on 11/19/2015 that yielded 1.5 L of fluid. Showed clinical improvement. He was discharged to skilled nursing facility for acute rehabilitation  1. Hepatic encephalopathy -Peter Larson is a pleasant 65 year old gentleman with a history of advanced liver disease, admitted for probable hepatic encephalopathy. -Initial lab work showing an ammonia level of 49 -He was treated with rifaximin 515 g by mouth twice a day along with lactulose 15 g by mouth 3 times a day -Lab work on 11/18/2015 showing downward trend in ammonia level to 40. Clinically he is showing improvement, he was assisted out of bed to chair -On 11/19/2014 he continues to show clinical improvement, ambulated out to the hallway with walker -Family members reporting that he was close to his baseline  2. Functional decline/failure to thrive -Peter Larson's wife reporting that he has had a progressive decline over  the past 3 months, becoming progressively weaker having several falls at home. At one point she had to call paramedics to help get him back up. -I suspect related to advancing liver disease. Dehydration could also be contributed factor. His diuretics were held as he was given IV fluids. -During this hospitalization he was evaluated by physical therapy who felt he would benefit from rehabilitation at a skilled nursing facility. He required 2 person assist to get amount of bed to the chair. -Plan to discharge to [facility] when bed available.  3 Pleural effusion  -He seems to be having ongoing SOB and I wonder if this is limiting his activity -CXR showing right sided pleural effusion  -On 11/19/2015 he underwent a psych added thoracentesis with removal 1.5 L of fluid. Post procedure x-ray did not show evidence of pneumothorax -Breathing improving -Diuretics were restarted.  3. Acute on chronic renal failure -Presented with creatinine of 2.39, baseline creatinine appears to flexure between 1.5 and 1.8. -Diuretics were held on admission -On 11/20/2015 lab work showing downward trend in creatinine to 1.66, coming down from 2.39 on admission.  4. Hypertension. -Blood pressure stable, he had been on metoprolol however nursing staff reported that overnight he had bradycardia with heart rates in the 40s -Beta blocker was discontinued due to bradycardia -Started Norvasc10 mg PO q daily -Diuretics were restarted on 11/19/2015  5. Cirrhosis -Secondary to nonalcoholic liver disease  -Admitted for acute hepatic encephalopathy -Restarted diuretics on 11/19/2015 with improvement to his renal function -On 11/20/2015 lab work showing downward trend in his potassium to 5.1, spironolactone was restarted. Spironolactone dose was changed to 50 mg by mouth daily (he had been on 100 mg by mouth daily in the outpatient setting)  -Will need to follow potassium levels closely -Plan to discharge on Lasix at  40 mg by mouth daily. -Had follow up at [facility]  Procedures: Ultrasound-guided right-sided thoracentesis with removal of 1.5 L of fluid, procedure performed on 11/19/2015.  Consultations:  Interventional radiology  Physical therapy  Discharge Exam: Filed Vitals:   11/19/15 2030 11/20/15 1321  BP: 126/55 113/62  Pulse: 70 74  Temp: 98.1 F (36.7 C) 98.7 F (37.1 C)  Resp: 18 16    General exam: Awake and alert interactive, he was assisted out bed to chair this morning Respiratory system: Improved air movement to right lung, left lung is clear, normal respiratory effort Cardiovascular system: S1 & S2 heard, RRR. No JVD, murmurs, rubs, gallops or clicks. No pedal edema. Gastrointestinal system: Abdomen is distended, he did not have similar complaint with palpation. No rebound tenderness or guarding. Central nervous system: Alert and oriented. No focal neurological deficits. Extremities: Symmetric 5 x 5 power. Skin: No rashes, lesions or ulcers Psychiatry: Overall I think he is showing improvement, more interactive, awake and alert  Discharge Instructions    Current Discharge Medication List    CONTINUE these medications which have CHANGED   Details  spironolactone (ALDACTONE) 50 MG tablet Take 1 tablet (50 mg total) by mouth daily. Qty: 30 tablet, Refills: 0      CONTINUE these medications which have NOT CHANGED   Details  cholecalciferol (VITAMIN D) 1000 UNITS tablet Take 2,000 Units by mouth every morning.     citalopram (CELEXA) 10 MG tablet Take 1 tablet by mouth daily.    esomeprazole (NEXIUM) 40 MG capsule Take 40 mg  by mouth every evening.     furosemide (LASIX) 40 MG tablet Take 1 tablet (40 mg total) by mouth 2 (two) times daily. Qty: 60 tablet, Refills: 6    hydrOXYzine (ATARAX/VISTARIL) 25 MG tablet Take 25 mg by mouth every evening.     lactulose (CHRONULAC) 10 GM/15ML solution Take 45 mLs (30 g total) by mouth 3 (three) times  daily. Qty: 240 mL, Refills: 0    metoprolol succinate (TOPROL-XL) 25 MG 24 hr tablet Take 1 tablet by mouth at bedtime.    Multiple Vitamin (MULTIVITAMIN) tablet Take 1 tablet by mouth every morning.     rifaximin (XIFAXAN) 550 MG TABS tablet Take 1 tablet (550 mg total) by mouth 2 (two) times daily. Qty: 60 tablet, Refills: 0    simethicone (MYLICON) 80 MG chewable tablet Chew 1 tablet (80 mg total) by mouth 2 (two) times daily.    zinc sulfate 220 MG capsule Take 220 mg by mouth daily.      STOP taking these medications     acetaminophen (TYLENOL) 500 MG tablet      famotidine (PEPCID) 20 MG tablet      milk thistle 175 MG tablet        No Known Allergies    The results of significant diagnostics from this hospitalization (including imaging, microbiology, ancillary and laboratory) are listed below for reference.    Significant Diagnostic Studies: Dg Chest 1 View  11/19/2015  CLINICAL DATA:  Status post right thoracentesis today. Postprocedural examination. EXAM: CHEST 1 VIEW COMPARISON:  Single-view of the chest earlier today. FINDINGS: Right pleural effusion is decreased after thoracentesis. No pneumothorax is identified. The left lung is clear. No left pleural effusion is identified. There is cardiomegaly. IMPRESSION: Decreased right pleural effusion after thoracentesis. Negative for pneumothorax. Electronically Signed   By: Inge Rise M.D.   On: 11/19/2015 15:21   Dg Chest 1 View  11/17/2015  CLINICAL DATA:  Shortness of breath EXAM: CHEST 1 VIEW COMPARISON:  09/06/2015 FINDINGS: The tail like opacification of the entire right lung is new from the previous exam and is favored to represent enlarging right pleural effusion. Left lung is clear. There is pulmonary vascular congestion. IMPRESSION: 1. Veil like opacification of the right lung. Unchanged from previous exam. Electronically Signed   By: Kerby Moors M.D.   On: 11/17/2015 08:45   Ct Head Wo  Contrast  11/16/2015  CLINICAL DATA:  Altered mental status EXAM: CT HEAD WITHOUT CONTRAST TECHNIQUE: Contiguous axial images were obtained from the base of the skull through the vertex without intravenous contrast. COMPARISON:  02/12/2014 FINDINGS: Ventricle size normal.  Cerebral volume normal for age. Negative for acute or chronic infarction. Negative for hemorrhage or mass lesion. No fluid collection or midline shift. Normal calvarium. IMPRESSION: Negative Electronically Signed   By: Franchot Gallo M.D.   On: 11/16/2015 13:40   US Renal  11/16/2015  CLINICAL DATA:  Acute renal failure EXAM: RENAL / URINARY TRACT ULTRASOUND COMPLETE COMPARISON:  November 19, 2013 FINDINGS: Right Kidney: Length: 11.9 cm. Echogenicity within normal limits. There is renal cortical thinning. No mass perinephric fluid, or hydronephrosis visualized. No sonographically demonstrable calculus or ureterectasis. Left Kidney: Length: 12.2 cm. Echogenicity within normal limits. There is renal cortical thinning. No mass, perinephric fluid, or hydronephrosis visualized. No sonographically demonstrable calculus or ureterectasis. Bladder: Appears normal for degree of bladder distention. There is ascites in the right upper abdomen. The contour and echotexture of the liver are indicative of underlying cirrhosis.  IMPRESSION: There is bilateral renal cortical thinning, a finding associated with medical renal disease. The renal echogenicity is normal. No obstructing foci are identified in either kidney. There is ascites. The contour and echotexture of the liver are consistent with underlying hepatic cirrhosis. Electronically Signed   By: Lowella Grip III M.D.   On: 11/16/2015 16:03   Dg Chest Port 1 View  11/19/2015  CLINICAL DATA:  Shortness of breath EXAM: PORTABLE CHEST 1 VIEW COMPARISON:  11/17/2015 chest radiograph. FINDINGS: Stable cardiomediastinal silhouette with normal heart size. No pneumothorax. Moderate right pleural effusion, not  appreciably changed. No left pleural effusion. Patchy opacity throughout the right lung, not appreciably changed. No acute consolidative airspace disease in the left lung. IMPRESSION: Stable moderate right pleural effusion and patchy opacity throughout the right lung, favoring atelectasis, with a component of pneumonia or underlying lung mass not excluded. Electronically Signed   By: Ilona Sorrel M.D.   On: 11/19/2015 11:29   Dg Shoulder Left  11/03/2015  CLINICAL DATA:  Multiple falls with left shoulder pain and bruising. EXAM: LEFT SHOULDER - 2+ VIEW COMPARISON:  Chest radiograph 09/06/2015 FINDINGS: Left shoulder is located. Areas of lucency in the left humeral head without definite fracture. Left AC joint is intact. Visualized left ribs are intact. IMPRESSION: No acute abnormality in left shoulder. Electronically Signed   By: Markus Daft M.D.   On: 11/03/2015 17:08   US Thoracentesis Asp Pleural Space W/img Guide  11/19/2015  INDICATION: 65 year old with fatty liver disease with a right pleural effusion. Request is made today for a diagnostic and therapeutic thoracentesis EXAM: ULTRASOUND GUIDED DIAGNOSTIC AND THERAPEUTIC THORACENTESIS MEDICATIONS: 1% lidocaine COMPLICATIONS: None immediate. PROCEDURE: An ultrasound guided thoracentesis was thoroughly discussed with the patient and his wife and questions answered. The benefits, risks, alternatives and complications were also discussed. The patient's wife understands and wishes to proceed with the procedure. Written consent was obtained. Ultrasound was performed to localize and mark an adequate pocket of fluid in the right chest. The area was then prepped and draped in the normal sterile fashion. 1% Lidocaine was used for local anesthesia. A Safe-T-Centesis catheter was introduced. Thoracentesis was performed. The catheter was removed and a dressing applied. FINDINGS: A total of approximately 1.5 L of yellow fluid was removed. Samples were sent to the  laboratory as requested by the clinical team. IMPRESSION: Successful ultrasound guided right thoracentesis yielding 1.5 L of pleural fluid. Given this was the patient's first thoracentesis, the procedure was terminated at 1.5 L. There was a small residual amount of effusion remaining on ultrasound imaging. Read by: Saverio Danker, PA-C Electronically Signed   By: Marybelle Killings M.D.   On: 11/19/2015 15:52    Microbiology: Recent Results (from the past 240 hour(s))  MRSA PCR Screening     Status: None   Collection Time: 11/16/15  4:15 PM  Result Value Ref Range Status   MRSA by PCR NEGATIVE NEGATIVE Final    Comment:        The GeneXpert MRSA Assay (FDA approved for NASAL specimens only), is one component of a comprehensive MRSA colonization surveillance program. It is not intended to diagnose MRSA infection nor to guide or monitor treatment for MRSA infections.   Culture, body fluid-bottle     Status: None (Preliminary result)   Collection Time: 11/19/15  3:31 PM  Result Value Ref Range Status   Specimen Description FLUID PLEURAL  Final   Special Requests   Final    BOTTLES DRAWN AEROBIC  AND ANAEROBIC 5CC Performed at [facility]    Culture PENDING  Incomplete   Report Status PENDING  Incomplete  Gram stain     Status: None   Collection Time: 11/19/15  3:31 PM  Result Value Ref Range Status   Specimen Description FLUID PLEURAL  Final   Special Requests NONE  Final   Gram Stain   Final    RARE WBC PRESENT,BOTH PMN AND MONONUCLEAR NO ORGANISMS SEEN Performed at [facility]    Report Status 11/20/2015 FINAL  Final     Labs: Basic Metabolic Panel:  Recent Labs Lab 11/16/15 1809 11/17/15 0309 11/18/15 0646 11/19/15 0332 11/20/15 0402  NA 133* 133* 132* 134* 134*  K 5.6* 5.6* 5.6* 5.4* 5.1  CL 105 105 104 105 106  CO2 17* 22 23 24 24   GLUCOSE 74 75 78 86 82  BUN 58* 57* 60* 58* 50*  CREATININE 2.25* 2.14* 1.99* 1.87* 1.66*  CALCIUM 8.9 8.8* 9.1 9.2  9.1  MG 2.5*  --   --   --   --   PHOS 4.5  --   --   --   --    Liver Function Tests:  Recent Labs Lab 11/16/15 0919 11/17/15 0309  AST 55* 45*  ALT 43 34  ALKPHOS 126 95  BILITOT 3.6* 3.4*  PROT 6.3* 5.2*  ALBUMIN 3.0* 2.8*   No results for input(s): LIPASE, AMYLASE in the last 168 hours.  Recent Labs Lab 11/16/15 0922 11/18/15 0646  AMMONIA 89* 40*   CBC:  Recent Labs Lab 11/16/15 0919 11/17/15 0309 11/19/15 0332 11/20/15 0402  WBC 5.6 5.5 3.9* 3.7*  HGB 11.9* 10.0* 9.5* 9.6*  HCT 34.6* 29.5* 27.9* 28.3*  MCV 89.2 91.6 91.2 93.4  PLT 90* 68* 47* 52*   Cardiac Enzymes: No results for input(s): CKTOTAL, CKMB, CKMBINDEX, TROPONINI in the last 168 hours. BNP: BNP (last 3 results)  Recent Labs  06/30/15 1948 07/01/15 0414 09/06/15 0030  BNP 80.9 89.0 36.1    ProBNP (last 3 results) No results for input(s): PROBNP in the last 8760 hours.  CBG:  Recent Labs Lab 11/16/15 0952  GLUCAP 86       Signed:  Kelvin Cellar MD.  [facility] 11/20/2015, 3:06 PM

## 2015-11-20 NOTE — Progress Notes (Signed)
CSW met with pt /spouse to assist with d/c planning. Pt is hoping to have [facility] Rehab at [facility] ( [facility] CSW has contacted [facility] but has not yet received bed offer. Today's PT notes have been sent to [facility]. 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 Spouse reports that she is able to pay out of pocket ( for up to 2 days or up to $ 1,000 )  if SNF is unable to get insurance authorization prior to d/c. Pt does have additional SNF bed offers if needed, to consider another placement, but both pt / spouse are hoping [facility] will assist. CSW will continue to follow to assist with d/c planning to SNF.  Werner Lean LCSW [phone]

## 2015-11-20 NOTE — Evaluation (Signed)
Occupational Therapy Evaluation Patient Details Name: Peter Larson MRN: [id number] DOB: 10-02-1950 Today's Date: 11/20/2015    History of Present Illness 65 yo male admitted with hepatic encephalopathy. hx of chronic diarrhea, cirrhosis, DM, HTN, CHF, morbid obesity, CKD   Clinical Impression   PT admitted with hepatic encephalopathy . Pt currently with functional limitiations due to the deficits listed below (see OT problem list). PTA was working and independent with adls. Pt with x2 falls striking head twice week of admission per patient. Pt falling both times outside the home.  Pt will benefit from skilled OT to increase their independence and safety with adls and balance to allow discharge SNF. Pt deconditioned and executive cognitive deficits noted.      Follow Up Recommendations  SNF;Supervision/Assistance - 24 hour    Equipment Recommendations  3 in 1 bedside comode;Wheelchair (measurements OT);Wheelchair cushion (measurements OT)    Recommendations for Other Services       Precautions / Restrictions Precautions Precautions: Fall      Mobility Bed Mobility               General bed mobility comments: in chair on arrival  Transfers Overall transfer level: Needs assistance Equipment used: Rolling walker (2 wheeled) Transfers: Sit to/from Stand Sit to Stand: +2 physical assistance;Min assist         General transfer comment: cues for hand placement and anterior weight shift to help with power up into standing.     Balance Overall balance assessment: Needs assistance Sitting-balance support: Bilateral upper extremity supported;Feet supported Sitting balance-Leahy Scale: Good     Standing balance support: During functional activity;Bilateral upper extremity supported Standing balance-Leahy Scale: Poor                              ADL Overall ADL's : Needs assistance/impaired Eating/Feeding: Set up;Sitting Eating/Feeding Details  (indicate cue type and reason): eating grapes and encouraged to incr PO intake Grooming: Wash/dry hands;Wash/dry face;Supervision/safety;Sitting                   Toilet Transfer: +2 for physical assistance;Minimal assistance;Ambulation;RW;BSC Toilet Transfer Details (indicate cue type and reason): simulated with chair transfer. pt completed transfer with tech and wife just prior to arrival to the room. pt was total (A) for peri care per tech         Functional mobility during ADLs: +2 for physical assistance;Minimal assistance;Rolling walker General ADL Comments: pt demonstrates DOE 3 / 4 and needs frequent rest breaks. pt needs incr time to complete task. Pt needing to return to sitting with cues for sequence and controlled descend.      Vision     Perception     Praxis      Pertinent Vitals/Pain Pain Assessment: No/denies pain     Hand Dominance Right   Extremity/Trunk Assessment Upper Extremity Assessment Upper Extremity Assessment: Overall WFL for tasks assessed   Lower Extremity Assessment Lower Extremity Assessment: Defer to PT evaluation   Cervical / Trunk Assessment Cervical / Trunk Assessment: Kyphotic   Communication Communication Communication: No difficulties   Cognition Arousal/Alertness: Awake/alert Behavior During Therapy: WFL for tasks assessed/performed Overall Cognitive Status: Impaired/Different from baseline Area of Impairment: Awareness;Safety/judgement         Safety/Judgement: Decreased awareness of deficits;Decreased awareness of safety Awareness: Emergent   General Comments: pt needs cues to sequence task and demonstrates some delay. pt with executive functioning deficits noted with adls  General Comments       Exercises       Shoulder Instructions      Home Living Family/patient expects to be discharged to:: Skilled nursing facility                                        Prior Functioning/Environment  Level of Independence: Independent with assistive device(s)        Comments: PT using SPC and x2 falls week of admission. pt owns his own [employer]. pt does not drive. pt has family that live on the same road as wife and wife still works full time    OT Diagnosis: Generalized weakness   OT Problem List: Decreased strength;Decreased activity tolerance;Impaired balance (sitting and/or standing);Decreased safety awareness;Decreased knowledge of use of DME or AE;Decreased knowledge of precautions;Obesity;Cardiopulmonary status limiting activity   OT Treatment/Interventions: Self-care/ADL training;DME and/or AE instruction;Energy conservation;Therapeutic activities;Patient/family education;Balance training;Cognitive remediation/compensation    OT Goals(Current goals can be found in the care plan section) Acute Rehab OT Goals Patient Stated Goal: to return to work OT Goal Formulation: With patient Time For Goal Achievement: 12/04/15 Potential to Achieve Goals: Good  OT Frequency: Min 2X/week   Barriers to D/C: Decreased caregiver support          Co-evaluation PT/OT/SLP Co-Evaluation/Treatment: Yes Reason for Co-Treatment: For patient/therapist safety   OT goals addressed during session: Strengthening/ROM;ADL's and self-care      End of Session Equipment Utilized During Treatment: Gait belt;Rolling walker Nurse Communication: Mobility status;Precautions  Activity Tolerance: Patient tolerated treatment well Patient left: in chair;with call bell/phone within reach;with chair alarm set;with family/visitor present   Time: [id number] OT Time Calculation (min): 24 min Charges:  OT General Charges $OT Visit: 1 Procedure OT Evaluation $OT Eval Moderate Complexity: 1 Procedure G-Codes:    Parke Poisson B 12/01/2015, 12:18 PM  Jeri Modena   OTR/L PagerIP:[phone] Office: [phone] .

## 2015-11-21 DIAGNOSIS — I5032 Chronic diastolic (congestive) heart failure: Secondary | ICD-10-CM

## 2015-11-21 LAB — BASIC METABOLIC PANEL
ANION GAP: 6 (ref 5–15)
BUN: 45 mg/dL — ABNORMAL HIGH (ref 6–20)
CALCIUM: 9.2 mg/dL (ref 8.9–10.3)
CHLORIDE: 106 mmol/L (ref 101–111)
CO2: 24 mmol/L (ref 22–32)
Creatinine, Ser: 1.5 mg/dL — ABNORMAL HIGH (ref 0.61–1.24)
GFR calc Af Amer: 55 mL/min — ABNORMAL LOW (ref >60)
GFR calc non Af Amer: 47 mL/min — ABNORMAL LOW (ref >60)
GLUCOSE: 88 mg/dL (ref 65–99)
Potassium: 4.9 mmol/L (ref 3.5–5.1)
Sodium: 136 mmol/L (ref 135–145)

## 2015-11-21 NOTE — Progress Notes (Signed)
Subjective: He was sitting at bedside, denies any new complaints.  Objective: Filed Vitals:   11/20/15 2025 11/20/15 2225  BP: 122/69   Pulse: 77   Temp: 98.3 F (36.8 C)   Resp: 16 20  General: Alert and awake, oriented x3, not in any acute distress. HEENT: anicteric sclera, pupils reactive to light and accommodation, EOMI CVS: S1-S2 clear, no murmur rubs or gallops Chest: clear to auscultation bilaterally, no wheezing, rales or rhonchi Abdomen: soft nontender, nondistended, normal bowel sounds, no organomegaly Extremities: no cyanosis, clubbing or edema noted bilaterally Neuro: Cranial nerves II-XII intact, no focal neurological deficits  Assessment and plan Principal Problem:   Hepatic encephalopathy (HCC) Active Problems:   Essential hypertension, benign   Chronic diastolic congestive heart failure, NYHA class 1 (HCC)   CKD (chronic kidney disease), stage III   Liver cirrhosis secondary to NASH   AKI (acute kidney injury) ([place])   Acute renal failure superimposed on stage 3 chronic kidney disease (HCC)  No changes clinically since yesterday, discharge summary was dictated yesterday by my colleague Dr. Coralyn Pear. Patient ready to go to nursing home, discharge orders placed.  Birdie Hopes Pager: [phone] 11/21/2015, 10:14 AM

## 2015-11-21 NOTE — Progress Notes (Signed)
Patient discharged to SNF, copies of all discharge medications and instructions reviewed and sent to facility. Patient to be transported by spouse to facility.

## 2015-11-21 NOTE — Clinical Social Work Note (Signed)
Patient to be d/c'ed today to [facility].  Patient and family agreeable to plans will transport via personal vehicle, RN to call report to [phone] Room 601.  Evette Cristal, MSW, [place]

## 2015-11-22 LAB — PATHOLOGIST SMEAR REVIEW

## 2015-11-23 ENCOUNTER — Other Ambulatory Visit: Payer: Self-pay

## 2015-11-23 ENCOUNTER — Emergency Department (HOSPITAL_COMMUNITY): Payer: 59

## 2015-11-23 ENCOUNTER — Encounter (HOSPITAL_COMMUNITY): Payer: Self-pay | Admitting: Emergency Medicine

## 2015-11-23 ENCOUNTER — Inpatient Hospital Stay (HOSPITAL_COMMUNITY)
Admission: EM | Admit: 2015-11-23 | Discharge: 2015-11-27 | DRG: 441 | Disposition: A | Discharge status: Home / Self Care | Payer: 59 | Attending: Internal Medicine | Admitting: Internal Medicine

## 2015-11-23 DIAGNOSIS — K7290 Hepatic failure, unspecified without coma: Principal | ICD-10-CM | POA: Diagnosis present

## 2015-11-23 DIAGNOSIS — N390 Urinary tract infection, site not specified: Secondary | ICD-10-CM | POA: Diagnosis present

## 2015-11-23 DIAGNOSIS — F329 Major depressive disorder, single episode, unspecified: Secondary | ICD-10-CM | POA: Diagnosis present

## 2015-11-23 DIAGNOSIS — E875 Hyperkalemia: Secondary | ICD-10-CM | POA: Diagnosis present

## 2015-11-23 DIAGNOSIS — K5900 Constipation, unspecified: Secondary | ICD-10-CM | POA: Diagnosis present

## 2015-11-23 DIAGNOSIS — J90 Pleural effusion, not elsewhere classified: Secondary | ICD-10-CM

## 2015-11-23 DIAGNOSIS — D638 Anemia in other chronic diseases classified elsewhere: Secondary | ICD-10-CM | POA: Diagnosis present

## 2015-11-23 DIAGNOSIS — B9620 Unspecified Escherichia coli [E. coli] as the cause of diseases classified elsewhere: Secondary | ICD-10-CM | POA: Diagnosis present

## 2015-11-23 DIAGNOSIS — K7460 Unspecified cirrhosis of liver: Secondary | ICD-10-CM | POA: Diagnosis present

## 2015-11-23 DIAGNOSIS — G9341 Metabolic encephalopathy: Secondary | ICD-10-CM

## 2015-11-23 DIAGNOSIS — E1122 Type 2 diabetes mellitus with diabetic chronic kidney disease: Secondary | ICD-10-CM | POA: Diagnosis present

## 2015-11-23 DIAGNOSIS — G4733 Obstructive sleep apnea (adult) (pediatric): Secondary | ICD-10-CM | POA: Diagnosis present

## 2015-11-23 DIAGNOSIS — Z6838 Body mass index (BMI) 38.0-38.9, adult: Secondary | ICD-10-CM

## 2015-11-23 DIAGNOSIS — G9340 Encephalopathy, unspecified: Secondary | ICD-10-CM

## 2015-11-23 DIAGNOSIS — N183 Chronic kidney disease, stage 3 unspecified: Secondary | ICD-10-CM | POA: Diagnosis present

## 2015-11-23 DIAGNOSIS — S41112A Laceration without foreign body of left upper arm, initial encounter: Secondary | ICD-10-CM | POA: Diagnosis present

## 2015-11-23 DIAGNOSIS — D6489 Other specified anemias: Secondary | ICD-10-CM

## 2015-11-23 DIAGNOSIS — I5032 Chronic diastolic (congestive) heart failure: Secondary | ICD-10-CM | POA: Diagnosis present

## 2015-11-23 DIAGNOSIS — Z66 Do not resuscitate: Secondary | ICD-10-CM | POA: Diagnosis present

## 2015-11-23 DIAGNOSIS — K7581 Nonalcoholic steatohepatitis (NASH): Secondary | ICD-10-CM

## 2015-11-23 DIAGNOSIS — K219 Gastro-esophageal reflux disease without esophagitis: Secondary | ICD-10-CM | POA: Diagnosis present

## 2015-11-23 DIAGNOSIS — G4700 Insomnia, unspecified: Secondary | ICD-10-CM

## 2015-11-23 DIAGNOSIS — Z79899 Other long term (current) drug therapy: Secondary | ICD-10-CM

## 2015-11-23 DIAGNOSIS — F32A Depression, unspecified: Secondary | ICD-10-CM

## 2015-11-23 DIAGNOSIS — I130 Hypertensive heart and chronic kidney disease with heart failure and stage 1 through stage 4 chronic kidney disease, or unspecified chronic kidney disease: Secondary | ICD-10-CM | POA: Diagnosis present

## 2015-11-23 DIAGNOSIS — R4182 Altered mental status, unspecified: Secondary | ICD-10-CM | POA: Diagnosis not present

## 2015-11-23 HISTORY — DX: Obstructive sleep apnea (adult) (pediatric): G47.33

## 2015-11-23 HISTORY — DX: Dependence on other enabling machines and devices: Z99.89

## 2015-11-23 HISTORY — DX: Nonalcoholic steatohepatitis (NASH): K75.81

## 2015-11-23 LAB — URINE MICROSCOPIC-ADD ON

## 2015-11-23 LAB — COMPREHENSIVE METABOLIC PANEL
ALT: 32 U/L (ref 17–63)
AST: 59 U/L — ABNORMAL HIGH (ref 15–41)
Albumin: 2.9 g/dL — ABNORMAL LOW (ref 3.5–5.0)
Alkaline Phosphatase: 105 U/L (ref 38–126)
Anion gap: 6 (ref 5–15)
BUN: 37 mg/dL — ABNORMAL HIGH (ref 6–20)
CHLORIDE: 107 mmol/L (ref 101–111)
CO2: 23 mmol/L (ref 22–32)
CREATININE: 1.69 mg/dL — AB (ref 0.61–1.24)
Calcium: 9.3 mg/dL (ref 8.9–10.3)
GFR, EST AFRICAN AMERICAN: 48 mL/min — AB (ref >60)
GFR, EST NON AFRICAN AMERICAN: 41 mL/min — AB (ref >60)
Glucose, Bld: 98 mg/dL (ref 65–99)
POTASSIUM: 4.8 mmol/L (ref 3.5–5.1)
Sodium: 136 mmol/L (ref 135–145)
Total Bilirubin: 2.8 mg/dL — ABNORMAL HIGH (ref 0.3–1.2)
Total Protein: 5.5 g/dL — ABNORMAL LOW (ref 6.5–8.1)

## 2015-11-23 LAB — CBC WITH DIFFERENTIAL/PLATELET
Basophils Absolute: 0 10*3/uL (ref 0.0–0.1)
Basophils Relative: 0 %
EOS ABS: 0.1 10*3/uL (ref 0.0–0.7)
EOS PCT: 3 %
HCT: 31.4 % — ABNORMAL LOW (ref 39.0–52.0)
Hemoglobin: 10.5 g/dL — ABNORMAL LOW (ref 13.0–17.0)
LYMPHS ABS: 0.8 10*3/uL (ref 0.7–4.0)
Lymphocytes Relative: 22 %
MCH: 30.7 pg (ref 26.0–34.0)
MCHC: 33.4 g/dL (ref 30.0–36.0)
MCV: 91.8 fL (ref 78.0–100.0)
MONOS PCT: 13 %
Monocytes Absolute: 0.5 10*3/uL (ref 0.1–1.0)
Neutro Abs: 2.3 10*3/uL (ref 1.7–7.7)
Neutrophils Relative %: 62 %
PLATELETS: 70 10*3/uL — AB (ref 150–400)
RBC: 3.42 MIL/uL — ABNORMAL LOW (ref 4.22–5.81)
RDW: 17 % — AB (ref 11.5–15.5)
WBC: 3.7 10*3/uL — ABNORMAL LOW (ref 4.0–10.5)

## 2015-11-23 LAB — URINALYSIS, ROUTINE W REFLEX MICROSCOPIC
BILIRUBIN URINE: NEGATIVE
GLUCOSE, UA: NEGATIVE mg/dL
KETONES UR: NEGATIVE mg/dL
Nitrite: NEGATIVE
PROTEIN: NEGATIVE mg/dL
Specific Gravity, Urine: 1.014 (ref 1.005–1.030)
pH: 5.5 (ref 5.0–8.0)

## 2015-11-23 LAB — RAPID URINE DRUG SCREEN, HOSP PERFORMED
Amphetamines: NOT DETECTED
Barbiturates: NOT DETECTED
Benzodiazepines: NOT DETECTED
COCAINE: NOT DETECTED
OPIATES: NOT DETECTED
TETRAHYDROCANNABINOL: NOT DETECTED

## 2015-11-23 LAB — I-STAT CG4 LACTIC ACID, ED
Lactic Acid, Venous: 1.45 mmol/L (ref 0.5–2.0)
Lactic Acid, Venous: 1.02 mmol/L (ref 0.5–2.0)

## 2015-11-23 LAB — GLUCOSE, CAPILLARY
GLUCOSE-CAPILLARY: 68 mg/dL (ref 65–99)
GLUCOSE-CAPILLARY: 72 mg/dL (ref 65–99)
Glucose-Capillary: 85 mg/dL (ref 65–99)

## 2015-11-23 LAB — AMMONIA: AMMONIA: 127 umol/L — AB (ref 9–35)

## 2015-11-23 LAB — PROTIME-INR
INR: 1.62 — ABNORMAL HIGH (ref 0.00–1.49)
Prothrombin Time: 19.3 seconds — ABNORMAL HIGH (ref 11.6–15.2)

## 2015-11-23 MED ORDER — CITALOPRAM HYDROBROMIDE 10 MG PO TABS
10.0000 mg | ORAL_TABLET | Freq: Every day | ORAL | Status: DC
  Administered 2015-11-24 – 2015-11-27 (×4): 10 mg via ORAL
  Filled 2015-11-23 (×4): qty 1

## 2015-11-23 MED ORDER — LACTULOSE ENEMA
300.0000 mL | Freq: Two times a day (BID) | ORAL | Status: DC
  Filled 2015-11-23 (×2): qty 300

## 2015-11-23 MED ORDER — ONDANSETRON HCL 4 MG PO TABS: 4.0000 mg | ORAL_TABLET | Freq: Four times a day (QID) | ORAL | Status: DC | PRN

## 2015-11-23 MED ORDER — HYDROXYZINE HCL 25 MG PO TABS
25.0000 mg | ORAL_TABLET | Freq: Every evening | ORAL | Status: DC | PRN
  Administered 2015-11-24: 25 mg via ORAL
  Filled 2015-11-23: qty 1

## 2015-11-23 MED ORDER — SPIRONOLACTONE 50 MG PO TABS
50.0000 mg | ORAL_TABLET | Freq: Every day | ORAL | Status: DC
  Administered 2015-11-24 – 2015-11-27 (×4): 50 mg via ORAL
  Filled 2015-11-23: qty 2
  Filled 2015-11-23: qty 1
  Filled 2015-11-23 (×3): qty 2
  Filled 2015-11-23 (×3): qty 1

## 2015-11-23 MED ORDER — SODIUM CHLORIDE 0.9% FLUSH
3.0000 mL | Freq: Two times a day (BID) | INTRAVENOUS | Status: DC
  Administered 2015-11-24 – 2015-11-27 (×4): 3 mL via INTRAVENOUS

## 2015-11-23 MED ORDER — SODIUM CHLORIDE 0.45 % IV SOLN
INTRAVENOUS | Status: DC
  Administered 2015-11-23: 18:00:00 via INTRAVENOUS

## 2015-11-23 MED ORDER — LACTULOSE 10 GM/15ML PO SOLN
30.0000 g | Freq: Once | ORAL | Status: DC
  Filled 2015-11-23: qty 45

## 2015-11-23 MED ORDER — RIFAXIMIN 550 MG PO TABS
550.0000 mg | ORAL_TABLET | Freq: Two times a day (BID) | ORAL | Status: DC
  Administered 2015-11-23 – 2015-11-27 (×8): 550 mg via ORAL
  Filled 2015-11-23 (×8): qty 1

## 2015-11-23 MED ORDER — METOPROLOL SUCCINATE ER 25 MG PO TB24
25.0000 mg | ORAL_TABLET | Freq: Every day | ORAL | Status: DC
  Administered 2015-11-23 – 2015-11-26 (×4): 25 mg via ORAL
  Filled 2015-11-23 (×4): qty 1

## 2015-11-23 MED ORDER — DEXTROSE 5 % IV SOLN
1.0000 g | INTRAVENOUS | Status: DC
  Administered 2015-11-23 – 2015-11-25 (×3): 1 g via INTRAVENOUS
  Filled 2015-11-23 (×4): qty 10

## 2015-11-23 MED ORDER — DEXTROSE-NACL 5-0.45 % IV SOLN
INTRAVENOUS | Status: DC
  Administered 2015-11-23 – 2015-11-26 (×4): via INTRAVENOUS

## 2015-11-23 MED ORDER — OXYCODONE HCL 5 MG PO TABS: 5.0000 mg | ORAL_TABLET | ORAL | Status: DC | PRN

## 2015-11-23 MED ORDER — LACTULOSE 10 GM/15ML PO SOLN
30.0000 g | Freq: Once | ORAL | Status: AC
  Administered 2015-11-23: 30 g via ORAL
  Filled 2015-11-23: qty 45

## 2015-11-23 MED ORDER — PANTOPRAZOLE SODIUM 40 MG PO TBEC
80.0000 mg | DELAYED_RELEASE_TABLET | Freq: Every day | ORAL | Status: DC
  Administered 2015-11-23 – 2015-11-27 (×5): 80 mg via ORAL
  Filled 2015-11-23 (×5): qty 2

## 2015-11-23 MED ORDER — FUROSEMIDE 20 MG PO TABS
40.0000 mg | ORAL_TABLET | Freq: Every day | ORAL | Status: DC
  Administered 2015-11-23 – 2015-11-27 (×5): 40 mg via ORAL
  Filled 2015-11-23 (×5): qty 2

## 2015-11-23 MED ORDER — ONDANSETRON HCL 4 MG/2ML IJ SOLN
4.0000 mg | Freq: Four times a day (QID) | INTRAMUSCULAR | Status: DC | PRN
  Administered 2015-11-25: 4 mg via INTRAVENOUS
  Filled 2015-11-23: qty 2

## 2015-11-23 NOTE — ED Notes (Signed)
Report attempted nurse to call back.  

## 2015-11-23 NOTE — ED Notes (Signed)
MD at bedside. 

## 2015-11-23 NOTE — ED Notes (Signed)
Pt from [facility] for an altered mental status that started yesterday afternoon. Pt has history of elevated ammonia level. Pt is alert to voice and oriented to person and place. Pt is slow to respond and speech is "babbled".

## 2015-11-23 NOTE — Consult Note (Signed)
[address]       [place] [address]             [phone]        Gardiner J Iafrate Aniwa Medical Record [id number] Date of Birth: February 24, 1951  Referring: Dr. Marily Memos Primary Care: [name], MD  Chief Complaint:    Chief Complaint  Patient presents with  . Altered Mental Status    History of Present Illness:      Mr. 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  His cirrhosis is monitored by Gastroenterology at [facility] and the patient is currently undergoing workup to be on the transplant list.  He was admitted last week with complaints of worsening confusion and generalized weakness.  His workup at that time showed elevated creatinine, hyperkalemia and an elevated ammonia level.  He was treated with IV fluids, diuretics, lactulose and rifaximin for hepatic encephalopathy.  Also during that admission he was found to have a large right sided pleural effusion.  He underwent US guided Thoracentesis with removal of 1.5 L of yellow fluid.  Cultures were obtained and are negative to date.  Cytology was also performed.  He was discharged to [facility] for further care.  He again presents to the ED today with worsening symptoms of altered mental status that began yesterday. Workup again shows an elevated Ammonia level at 127.  He was treated with Lactulose enema.  UA was obtained and showed trace leukocyte esterase and he was placed Rocephin for UTI.  INR is elevated at 1.6.  Repeat CXR was obtained and again showed a recurrence of a large right sided pleural effusions.  He will be admitted by the hospitalist service and they are requesting TCTS consult for possible Pleur-x catheter placement. The patient is unable to provide history.  His wife and sister were present to answer questions.  She states he was initially diagnosed with his liver disease  about 10 years ago.  She states he was doing as expected until the last year which he has were gotten progressively worse.  She states he has only had the thoracentesis performed here last week.  He has also had a paracentesis at [facility].  She states he was doing well post hospital discharge.  He was able to participate with therapy yesterday and ate well, but she noticed last night his speech became a little off and her daughter states that this morning while she was visiting he was not making sense.   Current Activity/ Functional Status: Patient is not independent with mobility/ambulation, transfers, ADL's, IADL's.    Past Medical History  Diagnosis Date  . Sleep apnea   . Splenomegaly     slt thrombocytopenia;no complics. egd [id number] neg for varices, ?mild portal gastropathy; 02/2011 nl,novarices  . 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 Hypertension   . Adenomatous colon polyp '01 & '08  . Hemorrhoids     Severe anorectal pain and anal fissure w bleeding following hemorrhoidectomy may 2010  . Family history of colon cancer     mother --19  . Seasonal allergies   . Screening     Hepatocellular Screening CT neg 05/2008, U/S neg 04/2009,05/2010  . Dysphagia     BaS/tab neg 8/09; egd's neg for ring/strict--?dysmotility  . Chronic diastolic congestive heart failure ([place])  a.  ECHO 01/13/14: EF 55-60%; moderate LVH, G2DD, biatrial enlargement; mild RVE; moderate MR; mildly elevated, pulm pressures.  . OSA (obstructive sleep apnea)   . Morbid obesity ([place])   . Frequent PVCs   . Pancytopenia ([place])   . Cirrhosis ([place])     a. Due to NASH, followed by [facility].  Marland Kitchen Shortness of breath   . Arthritis     RA  " ALL OVER "  . Dilatation of aorta ([facility])     a. Mild aortic root dilitation by echo 01/13/14  . Chronic hepatitis ([place])     cirrhosis  . Hepatic encephalopathy ([place])   . CKD (chronic kidney disease)   . Pleural effusion 08/2015    Past Surgical History    Procedure Laterality Date  . Fissure surgery    . Hemorrhoid surgery    . Esophagogastroduodenoscopy (egd) with propofol N/A 03/11/2015    Procedure: ESOPHAGOGASTRODUODENOSCOPY (EGD) WITH PROPOFOL;  Surgeon: Ronald Lobo, MD;  Location: [facility];  Service: Endoscopy;  Laterality: N/A;  . Colonoscopy with propofol N/A 03/11/2015    Procedure: COLONOSCOPY WITH PROPOFOL;  Surgeon: Ronald Lobo, MD;  Location: [facility];  Service: Endoscopy;  Laterality: N/A;    History  Smoking status  . Never Smoker   Smokeless tobacco  . Never Used   History  Alcohol Use No    Comment: quit 2008    Social History   Social History  . Marital Status: Married    Spouse Name: N/A  . Number of Children: N/A  . Years of Education: N/A   Occupational History  . Not on file.   Social History Main Topics  . Smoking status: Never Smoker   . Smokeless tobacco: Never Used  . Alcohol Use: No     Comment: quit 2008  . Drug Use: No  . Sexual Activity: Not on file   Other Topics Concern  . Not on file   Social History Narrative    No Known Allergies  Current Facility-Administered Medications  Medication Dose Route Frequency Provider Last Rate Last Dose  . lactulose (CHRONULAC) enema 200 gm  300 mL Rectal BID Waldemar Dickens, MD       Current Outpatient Prescriptions  Medication Sig Dispense Refill  . cholecalciferol (VITAMIN D) 1000 UNITS tablet Take 2,000 Units by mouth every morning.     . citalopram (CELEXA) 10 MG tablet Take 1 tablet by mouth daily.    Marland Kitchen esomeprazole (NEXIUM) 40 MG capsule Take 40 mg by mouth every evening.     . furosemide (LASIX) 40 MG tablet Take 1 tablet (40 mg total) by mouth 2 (two) times daily. (Patient taking differently: Take 40 mg by mouth daily. ) 60 tablet 6  . hydrOXYzine (ATARAX/VISTARIL) 25 MG tablet Take 25 mg by mouth every evening.     . lactulose (CHRONULAC) 10 GM/15ML solution Take 45 mLs (30 g total) by mouth 3 (three) times daily. (Patient  taking differently: Take 15 g by mouth 3 (three) times daily. ) 240 mL 0  . metoprolol succinate (TOPROL-XL) 25 MG 24 hr tablet Take 1 tablet by mouth at bedtime.    . Multiple Vitamin (MULTIVITAMIN) tablet Take 1 tablet by mouth every morning.     . rifaximin (XIFAXAN) 550 MG TABS tablet Take 1 tablet (550 mg total) by mouth 2 (two) times daily. 60 tablet 0  . simethicone (MYLICON) 80 MG chewable tablet Chew 1 tablet (80 mg total) by mouth 2 (two)  times daily. (Patient taking differently: Chew 160 mg by mouth 4 (four) times daily as needed for flatulence. )    . spironolactone (ALDACTONE) 50 MG tablet Take 1 tablet (50 mg total) by mouth daily. 30 tablet 0  . zinc sulfate 220 MG capsule Take 220 mg by mouth daily.       Family History  Problem Relation Age of Onset  . Cancer Mother     died of breast cancer  . Liver disease Father   . Thyroid disease Sister    Review of Systems:  Not obtainable, but reviewed history with wife     Cardiac Review of Systems: Y or N  Chest Pain [    ]  Resting SOB [   ] Exertional SOB  [  ]  Orthopnea [  ]   Pedal Edema [   ]    Palpitations [  ] Syncope  [  ]   Presyncope [   ]  General Review of Systems: [Y] = yes [  ]=no--- unable to review as patient is unable to answer Constitional: recent weight change [  ]; anorexia [  ]; fatigue [  ]; nausea [  ]; night sweats [  ]; fever [  ]; or chills [  ]                                                               Dental: poor dentition[  ]; Last Dentist visit:   Eye : blurred vision [  ]; diplopia [   ]; vision changes [  ];  Amaurosis fugax[  ]; Resp: cough [  ];  wheezing[  ];  hemoptysis[  ]; shortness of breath[  ]; paroxysmal nocturnal dyspnea[  ]; dyspnea on exertion[  ]; or orthopnea[  ];  GI:  gallstones[  ], vomiting[  ];  dysphagia[  ]; melena[  ];  hematochezia [  ]; heartburn[  ];   Hx of  Colonoscopy[  ]; GU: kidney stones [  ]; hematuria[  ];   dysuria [  ];  nocturia[  ];  history of      obstruction [  ]; urinary frequency [  ]             Skin: rash, swelling[  ];, hair loss[  ];  peripheral edema[  ];  or itching[  ]; Musculosketetal: myalgias[  ];  joint swelling[  ];  joint erythema[  ];  joint pain[  ];  back pain[  ];  Heme/Lymph: bruising[  ];  bleeding[  ];  anemia[  ];  Neuro: TIA[  ];  headaches[  ];  stroke[  ];  vertigo[  ];  seizures[  ];   paresthesias[  ];  difficulty walking[  ];  Psych:depression[  ]; anxiety[  ];  Endocrine: diabetes[  ];  thyroid dysfunction[  ];  Immunizations: Flu [  ]; Pneumococcal[  ];  Other:  Physical Exam: BP 112/55 mmHg  Pulse 48  Temp(Src) 98.1 F (36.7 C) (Oral)  Resp 14  SpO2 95%  General appearance: resting, disoriented Head: Normocephalic, without obvious abnormality, atraumatic Neck: no adenopathy, no carotid bruit, no JVD, supple, symmetrical, trachea midline and thyroid not enlarged, symmetric, no tenderness/mass/nodules Lymph nodes: Cervical, supraclavicular, and axillary  nodes normal. Resp: diminished breath sounds - right Cardio: regular rate and rhythm GI: non-tender, + distention Extremities: edema 1-2+ pitting LE Neurologic: Mental status: altered, unable to answer questions  Diagnostic Studies & Laboratory data:     Recent Radiology Findings:   Dg Chest 2 View  11/23/2015  CLINICAL DATA:  Altered mental status, history diabetes mellitus, hypertension EXAM: CHEST  2 VIEW COMPARISON:  11/19/2015 FINDINGS: Normal heart size mediastinal contours. Large RIGHT pleural effusion and basilar atelectasis. Decreased lung volumes. LEFT lung grossly clear. No pneumothorax. Chronic RIGHT rotator cuff tear. IMPRESSION: Large RIGHT pleural effusion with RIGHT basilar atelectasis. Electronically Signed   By: Lavonia Dana M.D.   On: 11/23/2015 13:10   Ct Head Wo Contrast  11/23/2015  CLINICAL DATA:  mental status changes, lethargy EXAM: CT HEAD WITHOUT CONTRAST TECHNIQUE: Contiguous axial images were obtained from the base  of the skull through the vertex without intravenous contrast. COMPARISON:  11/16/2015 FINDINGS: Brain: No intracranial hemorrhage, mass effect or midline shift. No acute cortical infarction. Mild cerebral atrophy. No mass lesion is noted on this unenhanced scan. No intra or extra-axial fluid collection. Vascular: No hyperdense vessel or unexpected calcification. Skull: Negative for fracture or focal lesion. Sinuses/Orbits: No acute findings. Other: None IMPRESSION: No acute intracranial abnormality. Mild cerebral atrophy. No definite acute cortical infarction. Electronically Signed   By: Lahoma Crocker M.D.   On: 11/23/2015 14:37     I have independently reviewed the above radiologic studies.  Recent Lab Findings: Lab Results  Component Value Date   WBC 3.7* 11/23/2015   HGB 10.5* 11/23/2015   HCT 31.4* 11/23/2015   PLT 70* 11/23/2015   GLUCOSE 98 11/23/2015   ALT 32 11/23/2015   AST 59* 11/23/2015   NA 136 11/23/2015   K 4.8 11/23/2015   CL 107 11/23/2015   CREATININE 1.69* 11/23/2015   BUN 37* 11/23/2015   CO2 23 11/23/2015   TSH 1.980 01/29/2014   INR 1.62* 11/23/2015   HGBA1C 5.1 08/31/2015      Assessment / Plan:      1.  Right Pleural Effusion- S/P Thoracentesis 6/16... Drainage was stopped at 1.5  so was not completely drained . Would suggest second thoracentesis on the right before d/c and completely drain. Follow up chest xray next week (has appointment at [facility]) and judge rate of re accumulation. Can consider Pleurix if recurrent pleural effusion but in situation of liver failure with ascites repeated drainage can lead to large fluid and protein loss.  Patient currently asymptomatic from respiratory standpoint. I have discussed the situation with the patients wife . Will defer decision about pleur ix to following MD at [facility]. I am willing to place Pleurix at [facility]   If it  is more convenient  for patient and family   2. Non- alcoholic fatty liver disease- followed at [facility], ammonia  level elevated at 172, elevated INR at 1.6.Marland KitchenMarland Kitchen Currently being treated with lactulose enemas, diuretics, fluids 3. Dispo- patient with recurrent right sided Pleural effusion- would recommend continued Thoracentesis for symptomatic relief.     I  spent 30 minutes counseling the patient face to face and 50% or more the  time was spent in counseling and coordination of care. The total time spent in the appointment was 40 minutes.  Grace Isaac MD      [address] [place] [address] Office [phone]   [place]

## 2015-11-23 NOTE — Care Management Note (Signed)
Case Management Note  Patient Details  Name: Peter Larson MRN: [id number] Date of Birth: 1950-09-06  Subjective/Objective:                  65 yo male pt from [facility] for an altered mental status that started yesterday afternoon.  Action/Plan: Follow for disposition needs. /Return to [facility] at discharge.   Expected Discharge Date:  11/26/15               Expected Discharge Plan:  Skilled Nursing Facility  In-House Referral:  Clinical Social Work  Discharge planning Services     Post Acute Care Choice:    Choice offered to:     DME Arranged:    DME Agency:     HH Arranged:    HH Agency:     Status of Service:  In process, will continue to follow  If discussed at Long Length of Stay Meetings, dates discussed:    Additional Comments:  Fuller Mandril, RN 11/23/2015, 2:36 PM

## 2015-11-23 NOTE — Progress Notes (Signed)
Pt with a CBG of 72 up from 69 post a cup of orange juice. Provider K.Schorr was notified. Awaiting further orders. Will continue to monitor pt.

## 2015-11-23 NOTE — Progress Notes (Signed)
Pt was d/c'ed to [facility] on 11/21/2015. Inpatient CSW to follow Patient and facilitate transport back to facility when medically stable and cleared for discharge.     Emiliano Dyer, LCSW [facility] ED/40M Clinical Social Worker [phone]

## 2015-11-23 NOTE — H&P (Signed)
History and Physical    Peter Larson [id number] DOB: 1951/05/16 DOA: 11/23/2015  PCP: [name], MD Patient coming from: [facility]  Chief Complaint: AMS  HPI: Peter Larson is a 65 y.o. male with medical history significant of sleep apnea, DM, HTN, obesity, and ASH/cirrhosis, C KD, recurrent pleural effusions presenting from nursing home with acute encephalopathy. Patient was nerves normal state of health when he was discharged from the hospital on 11/16/2015. Family reports that one day ago patient did very well especially from a nutritional standpoint. Patient's appetite was better than it has been several months. There are no focal complaints by the patient including fever, cough, nausea, diarrhea, constipation, emesis, headache, abdominal pain, dysuria or frequency. Per report patient had only 1 bowel movement since leaving the hospital. Per review of nursing home charts patient has received lactulose 30 mg 3 times a day since arrival to the facility.  Level V caveat applies this patient is acutely encephalopathic and unable to provide history. History provided by patient's sister, nursing home notes, EDP report.    ED Course: AF VSS, basic chemistries ordered and patient found to have elevated ammonia, and abnormal urine. Right pleural effusion noted again on CXR.  Review of Systems: As per HPI otherwise 10 point review of systems negative.   Ambulatory Status: Minimally ambulatory. SNF  Past Medical History  Diagnosis Date  . Sleep apnea   . Splenomegaly     slt thrombocytopenia;no complics. egd Q000111Q neg for varices, ?mild portal gastropathy; 02/2011 nl,novarices  . Diabetes mellitus without complication ([place])     type two  . Hypertension   . Adenomatous colon polyp '01 & '08  . Hemorrhoids     Severe anorectal pain and anal fissure w bleeding following hemorrhoidectomy may 2010  . Family history of colon cancer     mother --12  . Seasonal allergies   . Screening       Hepatocellular Screening CT neg 05/2008, U/S neg 04/2009,05/2010  . Dysphagia     BaS/tab neg 8/09; egd's neg for ring/strict--?dysmotility  . Chronic diastolic congestive heart failure ([place])     a.  ECHO 01/13/14: EF 55-60%; moderate LVH, G2DD, biatrial enlargement; mild RVE; moderate MR; mildly elevated, pulm pressures.  . OSA (obstructive sleep apnea)   . Morbid obesity ([place])   . Frequent PVCs   . Pancytopenia ([place])   . Cirrhosis ([place])     a. Due to NASH, followed by [facility].  Marland Kitchen Shortness of breath   . Arthritis     RA  " ALL OVER "  . Dilatation of aorta (HCC)     a. Mild aortic root dilitation by echo 01/13/14  . Chronic hepatitis ([place])     cirrhosis  . Hepatic encephalopathy ([place])   . CKD (chronic kidney disease)   . Pleural effusion 08/2015    Past Surgical History  Procedure Laterality Date  . Fissure surgery    . Hemorrhoid surgery    . Esophagogastroduodenoscopy (egd) with propofol N/A 03/11/2015    Procedure: ESOPHAGOGASTRODUODENOSCOPY (EGD) WITH PROPOFOL;  Surgeon: Ronald Lobo, MD;  Location: [facility];  Service: Endoscopy;  Laterality: N/A;  . Colonoscopy with propofol N/A 03/11/2015    Procedure: COLONOSCOPY WITH PROPOFOL;  Surgeon: Ronald Lobo, MD;  Location: [facility];  Service: Endoscopy;  Laterality: N/A;    Social History   Social History  . Marital Status: Married    Spouse Name: N/A  . Number of Children: N/A  .  Years of Education: N/A   Occupational History  . Not on file.   Social History Main Topics  . Smoking status: Never Smoker   . Smokeless tobacco: Never Used  . Alcohol Use: No     Comment: quit 2008  . Drug Use: No  . Sexual Activity: Not on file   Other Topics Concern  . Not on file   Social History Narrative    No Known Allergies  Family History  Problem Relation Age of Onset  . Cancer Mother     died of breast cancer  . Liver disease Father   . Thyroid disease Sister     Prior to Admission  medications   Medication Sig Start Date End Date Taking? Authorizing Provider  cholecalciferol (VITAMIN D) 1000 UNITS tablet Take 2,000 Units by mouth every morning.     Historical Provider, MD  citalopram (CELEXA) 10 MG tablet Take 1 tablet by mouth daily. 09/30/15 09/29/16  Historical Provider, MD  esomeprazole (NEXIUM) 40 MG capsule Take 40 mg by mouth every evening.     Historical Provider, MD  furosemide (LASIX) 40 MG tablet Take 1 tablet (40 mg total) by mouth 2 (two) times daily. Patient taking differently: Take 40 mg by mouth daily. Alternate taking 40 and 60 mg every other day. 02/01/14   Dayna N Dunn, PA-C  hydrOXYzine (ATARAX/VISTARIL) 25 MG tablet Take 25 mg by mouth every evening.  08/26/15   Historical Provider, MD  lactulose (CHRONULAC) 10 GM/15ML solution Take 45 mLs (30 g total) by mouth 3 (three) times daily. Patient taking differently: Take 15 g by mouth 3 (three) times daily.  09/05/15   Lavina Hamman, MD  metoprolol succinate (TOPROL-XL) 25 MG 24 hr tablet Take 1 tablet by mouth at bedtime. 10/27/15 10/26/16  Historical Provider, MD  Multiple Vitamin (MULTIVITAMIN) tablet Take 1 tablet by mouth every morning.     Historical Provider, MD  rifaximin (XIFAXAN) 550 MG TABS tablet Take 1 tablet (550 mg total) by mouth 2 (two) times daily. 02/13/14   Geradine Girt, DO  simethicone (MYLICON) 80 MG chewable tablet Chew 1 tablet (80 mg total) by mouth 2 (two) times daily. Patient taking differently: Chew 160 mg by mouth 4 (four) times daily as needed for flatulence.  07/03/15   Modena Jansky, MD  spironolactone (ALDACTONE) 50 MG tablet Take 1 tablet (50 mg total) by mouth daily. 11/21/15   Kelvin Cellar, MD  zinc sulfate 220 MG capsule Take 220 mg by mouth daily. 08/17/15 08/16/16  Historical Provider, MD    Physical Exam: Filed Vitals:   11/23/15 1445 11/23/15 1500 11/23/15 1530 11/23/15 1545  BP: 111/64 110/57 118/57 112/55  Pulse: 102 112 79 48  Temp:      TempSrc:      Resp: 18 16  16 14   SpO2: 96% 97% 96% 95%     General: resting in bed, barely arousable.  Eyes: EOMI, scleral icteric ENT: Dry mucous membranes, normal tongue Neck:  no LAD, masses or thyromegaly Cardiovascular:  RRR, II/VI systolic murmur . 1+ LE edema.  Respiratory:  Diminished on R w/ intermittent crackles. nml effort.  Abdomen: distended, soft, NABS Skin: Jaundiced, SKin tears on LUE w/ duoderms in place. Scattered ecchymosis.  Musculoskeletal: diminished tone globally due to pt being obtunded. No bony abnormality.  Psychiatric:Neuro: Unable to fully evaluate due to patient's altered mental state. No focal abnormality appreciated. Patient is arousable with significant stimulation but is incoherent answers to questions. Cranial  nerves grossly intact. Moves all extremities upon command.  Labs on Admission: I have personally reviewed following labs and imaging studies  CBC:  Recent Labs Lab 11/17/15 0309 11/19/15 0332 11/20/15 0402 11/23/15 1245  WBC 5.5 3.9* 3.7* 3.7*  NEUTROABS  --   --   --  2.3  HGB 10.0* 9.5* 9.6* 10.5*  HCT 29.5* 27.9* 28.3* 31.4*  MCV 91.6 91.2 93.4 91.8  PLT 68* 47* 52* 70*   Basic Metabolic Panel:  Recent Labs Lab 11/16/15 1809  11/18/15 0646 11/19/15 0332 11/20/15 0402 11/21/15 0345 11/23/15 1245  NA 133*  < > 132* 134* 134* 136 136  K 5.6*  < > 5.6* 5.4* 5.1 4.9 4.8  CL 105  < > 104 105 106 106 107  CO2 17*  < > 23 24 24 24 23   GLUCOSE 74  < > 78 86 82 88 98  BUN 58*  < > 60* 58* 50* 45* 37*  CREATININE 2.25*  < > 1.99* 1.87* 1.66* 1.50* 1.69*  CALCIUM 8.9  < > 9.1 9.2 9.1 9.2 9.3  MG 2.5*  --   --   --   --   --   --   PHOS 4.5  --   --   --   --   --   --   < > = values in this interval not displayed. GFR: Estimated Creatinine Clearance: 66.3 mL/min (by C-G formula based on Cr of 1.69). Liver Function Tests:  Recent Labs Lab 11/17/15 0309 11/23/15 1245  AST 45* 59*  ALT 34 32  ALKPHOS 95 105  BILITOT 3.4* 2.8*  PROT 5.2* 5.5*    ALBUMIN 2.8* 2.9*   No results for input(s): LIPASE, AMYLASE in the last 168 hours.  Recent Labs Lab 11/18/15 0646 11/23/15 1245  AMMONIA 40* 127*   Coagulation Profile:  Recent Labs Lab 11/16/15 1809 11/23/15 1245  INR 1.74* 1.62*   Cardiac Enzymes: No results for input(s): CKTOTAL, CKMB, CKMBINDEX, TROPONINI in the last 168 hours. BNP (last 3 results) No results for input(s): PROBNP in the last 8760 hours. HbA1C: No results for input(s): HGBA1C in the last 72 hours. CBG: No results for input(s): GLUCAP in the last 168 hours. Lipid Profile: No results for input(s): CHOL, HDL, LDLCALC, TRIG, CHOLHDL, LDLDIRECT in the last 72 hours. Thyroid Function Tests: No results for input(s): TSH, T4TOTAL, FREET4, T3FREE, THYROIDAB in the last 72 hours. Anemia Panel: No results for input(s): VITAMINB12, FOLATE, FERRITIN, TIBC, IRON, RETICCTPCT in the last 72 hours. Urine analysis:    Component Value Date/Time   COLORURINE YELLOW 11/23/2015 1329   APPEARANCEUR CLOUDY* 11/23/2015 1329   LABSPEC 1.014 11/23/2015 1329   PHURINE 5.5 11/23/2015 1329   GLUCOSEU NEGATIVE 11/23/2015 1329   HGBUR TRACE* 11/23/2015 1329   BILIRUBINUR NEGATIVE 11/23/2015 1329   KETONESUR NEGATIVE 11/23/2015 1329   PROTEINUR NEGATIVE 11/23/2015 1329   UROBILINOGEN 1.0 02/23/2014 1710   NITRITE NEGATIVE 11/23/2015 1329   LEUKOCYTESUR TRACE* 11/23/2015 1329    Creatinine Clearance: Estimated Creatinine Clearance: 66.3 mL/min (by C-G formula based on Cr of 1.69).  Sepsis Labs: @LABRCNTIP (procalcitonin:4,lacticidven:4) ) Recent Results (from the past 240 hour(s))  MRSA PCR Screening     Status: None   Collection Time: 11/16/15  4:15 PM  Result Value Ref Range Status   MRSA by PCR NEGATIVE NEGATIVE Final    Comment:        The GeneXpert MRSA Assay (FDA approved for NASAL specimens only), is one  component of a comprehensive MRSA colonization surveillance program. It is not intended to diagnose  MRSA infection nor to guide or monitor treatment for MRSA infections.   Culture, body fluid-bottle     Status: None (Preliminary result)   Collection Time: 11/19/15  3:31 PM  Result Value Ref Range Status   Specimen Description FLUID PLEURAL  Final   Special Requests BOTTLES DRAWN AEROBIC AND ANAEROBIC 5CC  Final   Culture   Final    NO GROWTH 4 DAYS Performed at [facility]    Report Status PENDING  Incomplete  Gram stain     Status: None   Collection Time: 11/19/15  3:31 PM  Result Value Ref Range Status   Specimen Description FLUID PLEURAL  Final   Special Requests NONE  Final   Gram Stain   Final    RARE WBC PRESENT,BOTH PMN AND MONONUCLEAR NO ORGANISMS SEEN Performed at [facility]    Report Status 11/20/2015 FINAL  Final     Radiological Exams on Admission: Dg Chest 2 View  11/23/2015  CLINICAL DATA:  Altered mental status, history diabetes mellitus, hypertension EXAM: CHEST  2 VIEW COMPARISON:  11/19/2015 FINDINGS: Normal heart size mediastinal contours. Large RIGHT pleural effusion and basilar atelectasis. Decreased lung volumes. LEFT lung grossly clear. No pneumothorax. Chronic RIGHT rotator cuff tear. IMPRESSION: Large RIGHT pleural effusion with RIGHT basilar atelectasis. Electronically Signed   By: Lavonia Dana M.D.   On: 11/23/2015 13:10   Ct Head Wo Contrast  11/23/2015  CLINICAL DATA:  mental status changes, lethargy EXAM: CT HEAD WITHOUT CONTRAST TECHNIQUE: Contiguous axial images were obtained from the base of the skull through the vertex without intravenous contrast. COMPARISON:  11/16/2015 FINDINGS: Brain: No intracranial hemorrhage, mass effect or midline shift. No acute cortical infarction. Mild cerebral atrophy. No mass lesion is noted on this unenhanced scan. No intra or extra-axial fluid collection. Vascular: No hyperdense vessel or unexpected calcification. Skull: Negative for fracture or focal lesion. Sinuses/Orbits: No acute findings. Other:  None IMPRESSION: No acute intracranial abnormality. Mild cerebral atrophy. No definite acute cortical infarction. Electronically Signed   By: Lahoma Crocker M.D.   On: 11/23/2015 14:37     Assessment/Plan Active Problems:   CKD (chronic kidney disease), stage III   Encephalopathy acute   Metabolic encephalopathy   NASH (nonalcoholic steatohepatitis)   Cirrhosis (HCC)   Recurrent pleural effusion on right   Anemia, chronic disease   Insomnia   Depression   Acute encephalopathy: Likely metabolic from ammonia level of 127. Decreased reported bowel movement since discharge 2 days ago. Patient has been maintained per report on 30 g of lactulose by mouth 3 times a day at the nursing home. Other contributory source may be from a UTI. AFVSS, lactict acid 1.45. No evidence of sepsis/SBP, seizure, stroke, or other etiology. This is a common recurring problem for the patient. UDS neg. - Lactulose enema - Ammonia Q12 - BCX, UCX - Ceftriaxone for UTI - Air overlay mattress  NASH/Cirrhosis: Pt followed by [facility]. Second admission for complication from cirrhosis in past 2 wks. MELD score 22 (20% 12mo mortality). [facility] to get on [facility].  - continue spironolactone, lasix, xifaxin - consider Palliative Care consult   Pleural effusion: Recurrent. 4 admissions for treatment in past 5 months. Currently not in respiratory distress/failure. Discussed possibility of PleurX catheter w/ Cardio thoracic service, Erin, who have agreed to see the pt for surgical consideration. INR 1.6 - O2 PRN -  f/u Cardio thoracic recs ??PleurX??  CKD: Cr 1.69. At baseline - BMEt in am   HTN: - continue metop,   Anemia: 10.5. Baseline - CBC in am  INsomnia: - continue atarax  Depression: - continue Celexa    DVT prophylaxis: SCD pt auto anticoagulating w/ INR 1.6  Code Status: FULL  Family Communication: sister  Disposition Plan: pending improvement  Consults called: cardio thoracic  Admission  status: Inpatient - Tele.    MERRELL, DAVID J MD [facility]  If 7PM-7AM, please contact night-coverage [email or web address] Password [email or web address]  11/23/2015, 4:08 PM

## 2015-11-23 NOTE — ED Provider Notes (Signed)
CSN: [id number]     Arrival date & time 11/23/15  1229 History   First MD Initiated Contact with Patient 11/23/15 1236     Chief Complaint  Patient presents with  . Altered Mental Status   PT IS A 65 YO WM WITH A HX OF CIRRHOSIS DUE TO NASH WITH MULTIPLE ADMISSIONS FOR HEPATIC ENCEPHALOPATHY.  THE PT WAS ADMITTED FROM 6/13 TO 6/18 FOR THE SAME.  PT WENT TO [facility] AFTER D/C FROM HERE.   THEY SENT HIM HERE TODAY BECAUSE OF AMS.  PT IS UNABLE TO GIVE ANY HX.  [place], HE ALSO HAD A RECURRENT RIGHT PLEURAL EFFUSION THAT WAS DRAINED (1.5 L) BY THORACENTESIS.  (Consider location/radiation/quality/duration/timing/severity/associated sxs/prior Treatment) Patient is a 65 y.o. male presenting with altered mental status. The history is provided by the EMS personnel.  Altered Mental Status Presenting symptoms: behavior changes, confusion and disorientation   Severity:  Severe Most recent episode:  Today Episode history:  Continuous Timing:  Constant Progression:  Worsening   Past Medical History  Diagnosis Date  . Sleep apnea   . Splenomegaly     slt thrombocytopenia;no complics. egd Q000111Q neg for varices, ?mild portal gastropathy; 02/2011 nl,novarices  . 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 Hypertension   . Adenomatous colon polyp '01 & '08  . Hemorrhoids     Severe anorectal pain and anal fissure w bleeding following hemorrhoidectomy may 2010  . Family history of colon cancer     mother --21  . Seasonal allergies   . Screening     Hepatocellular Screening CT neg 05/2008, U/S neg 04/2009,05/2010  . Dysphagia     BaS/tab neg 8/09; egd's neg for ring/strict--?dysmotility  . Chronic diastolic congestive heart failure ([place])     a.  ECHO 01/13/14: EF 55-60%; moderate LVH, G2DD, biatrial enlargement; mild RVE; moderate MR; mildly elevated, pulm pressures.  . OSA (obstructive sleep apnea)   . Morbid obesity ([place])   . Frequent PVCs   . Pancytopenia ([place])    . Cirrhosis ([place])     a. Due to NASH, followed by [facility].  Marland Kitchen Shortness of breath   . Arthritis     RA  " ALL OVER "  . Dilatation of aorta (HCC)     a. Mild aortic root dilitation by echo 01/13/14  . Chronic hepatitis ([place])     cirrhosis  . Hepatic encephalopathy ([place])   . CKD (chronic kidney disease)   . Pleural effusion 08/2015   Past Surgical History  Procedure Laterality Date  . Fissure surgery    . Hemorrhoid surgery    . Esophagogastroduodenoscopy (egd) with propofol N/A 03/11/2015    Procedure: ESOPHAGOGASTRODUODENOSCOPY (EGD) WITH PROPOFOL;  Surgeon: Ronald Lobo, MD;  Location: [facility];  Service: Endoscopy;  Laterality: N/A;  . Colonoscopy with propofol N/A 03/11/2015    Procedure: COLONOSCOPY WITH PROPOFOL;  Surgeon: Ronald Lobo, MD;  Location: [facility];  Service: Endoscopy;  Laterality: N/A;   Family History  Problem Relation Age of Onset  . Cancer Mother     died of breast cancer  . Liver disease Father   . Thyroid disease Sister    Social History  Substance Use Topics  . Smoking status: Never Smoker   . Smokeless tobacco: Never Used  . Alcohol Use: No     Comment: quit 2008    Review of Systems  Unable to perform ROS: Mental status change  Psychiatric/Behavioral: Positive for confusion.      Allergies  Review of patient's allergies indicates no known allergies.  Home Medications   Prior to Admission medications   Medication Sig Start Date End Date Taking? Authorizing Provider  cholecalciferol (VITAMIN D) 1000 UNITS tablet Take 2,000 Units by mouth every morning.     Historical Provider, MD  citalopram (CELEXA) 10 MG tablet Take 1 tablet by mouth daily. 09/30/15 09/29/16  Historical Provider, MD  esomeprazole (NEXIUM) 40 MG capsule Take 40 mg by mouth every evening.     Historical Provider, MD  furosemide (LASIX) 40 MG tablet Take 1 tablet (40 mg total) by mouth 2 (two) times daily. Patient taking differently: Take 40 mg by mouth  daily. Alternate taking 40 and 60 mg every other day. 02/01/14   Dayna N Dunn, PA-C  hydrOXYzine (ATARAX/VISTARIL) 25 MG tablet Take 25 mg by mouth every evening.  08/26/15   Historical Provider, MD  lactulose (CHRONULAC) 10 GM/15ML solution Take 45 mLs (30 g total) by mouth 3 (three) times daily. Patient taking differently: Take 15 g by mouth 3 (three) times daily.  09/05/15   Lavina Hamman, MD  metoprolol succinate (TOPROL-XL) 25 MG 24 hr tablet Take 1 tablet by mouth at bedtime. 10/27/15 10/26/16  Historical Provider, MD  Multiple Vitamin (MULTIVITAMIN) tablet Take 1 tablet by mouth every morning.     Historical Provider, MD  rifaximin (XIFAXAN) 550 MG TABS tablet Take 1 tablet (550 mg total) by mouth 2 (two) times daily. 02/13/14   Geradine Girt, DO  simethicone (MYLICON) 80 MG chewable tablet Chew 1 tablet (80 mg total) by mouth 2 (two) times daily. Patient taking differently: Chew 160 mg by mouth 4 (four) times daily as needed for flatulence.  07/03/15   Modena Jansky, MD  spironolactone (ALDACTONE) 50 MG tablet Take 1 tablet (50 mg total) by mouth daily. 11/21/15   Kelvin Cellar, MD  zinc sulfate 220 MG capsule Take 220 mg by mouth daily. 08/17/15 08/16/16  Historical Provider, MD   BP 122/57 mmHg  Pulse 117  Temp(Src) 98.1 F (36.7 C) (Oral)  Resp 28  SpO2 95% Physical Exam  Constitutional: He appears well-developed and well-nourished. He appears lethargic.  HENT:  Head: Normocephalic and atraumatic.  Right Ear: External ear normal.  Left Ear: External ear normal.  Nose: Nose normal.  Mouth/Throat: Mucous membranes are dry.  Eyes: Conjunctivae and EOM are normal. Pupils are equal, round, and reactive to light.  Neck: Normal range of motion. Neck supple.  Cardiovascular: Normal rate, regular rhythm, normal heart sounds and intact distal pulses.   Pulmonary/Chest: Effort normal and breath sounds normal.  Abdominal: He exhibits distension. Bowel sounds are decreased.  Musculoskeletal:  He exhibits edema.  Neurological: He appears lethargic.  PT IS AWAKE, BUT NOT ORIENTED.  PT SOMNOLENT, BUT WILL WAKE UP WHEN STIMULATED.  Skin:  CHRONIC SKIN CHANGES BILATERAL LE  Nursing note and vitals reviewed.   ED Course  Procedures (including critical care time) Labs Review Labs Reviewed  CBC WITH DIFFERENTIAL/PLATELET - Abnormal; Notable for the following:    WBC 3.7 (*)    RBC 3.42 (*)    Hemoglobin 10.5 (*)    HCT 31.4 (*)    RDW 17.0 (*)    Platelets 70 (*)    All other components within normal limits  AMMONIA - Abnormal; Notable for the following:    Ammonia 127 (*)    All other components within normal limits  COMPREHENSIVE  METABOLIC PANEL - Abnormal; Notable for the following:    BUN 37 (*)    Creatinine, Ser 1.69 (*)    Total Protein 5.5 (*)    Albumin 2.9 (*)    AST 59 (*)    Total Bilirubin 2.8 (*)    GFR calc non Af Amer 41 (*)    GFR calc Af Amer 48 (*)    All other components within normal limits  PROTIME-INR - Abnormal; Notable for the following:    Prothrombin Time 19.3 (*)    INR 1.62 (*)    All other components within normal limits  URINALYSIS, ROUTINE W REFLEX MICROSCOPIC (NOT AT [facility]) - Abnormal; Notable for the following:    APPearance CLOUDY (*)    Hgb urine dipstick TRACE (*)    Leukocytes, UA TRACE (*)    All other components within normal limits  URINE MICROSCOPIC-ADD ON - Abnormal; Notable for the following:    Squamous Epithelial / LPF 0-5 (*)    Bacteria, UA MANY (*)    Casts HYALINE CASTS (*)    All other components within normal limits  URINE RAPID DRUG SCREEN, HOSP PERFORMED  I-STAT CG4 LACTIC ACID, ED  CBG MONITORING, ED    Imaging Review Dg Chest 2 View  11/23/2015  CLINICAL DATA:  Altered mental status, history diabetes mellitus, hypertension EXAM: CHEST  2 VIEW COMPARISON:  11/19/2015 FINDINGS: Normal heart size mediastinal contours. Large RIGHT pleural effusion and basilar atelectasis. Decreased lung volumes. LEFT lung  grossly clear. No pneumothorax. Chronic RIGHT rotator cuff tear. IMPRESSION: Large RIGHT pleural effusion with RIGHT basilar atelectasis. Electronically Signed   By: Lavonia Dana M.D.   On: 11/23/2015 13:10   I have personally reviewed and evaluated these images and lab results as part of my medical decision-making.   EKG Interpretation   Date/Time:  Tuesday November 23 2015 12:38:14 EDT Ventricular Rate:  79 PR Interval:    QRS Duration: 91 QT Interval:  423 QTC Calculation: 427 R Axis:   -23 Text Interpretation:  Sinus rhythm Multiform ventricular premature  complexes Borderline left axis deviation Low voltage, precordial leads  RSR' in V1 or V2, right VCD or RVH Confirmed by [facility] MD, Sherri Mcarthy ([id number])  on 11/23/2015 1:49:13 PM      MDM  PT WITH RECURRENT ELEVATED AMMONIA AND ENCEPHALOPATHY.  PT WILL BE GIVEN LACTULOSE IN THE ED.  I SPOKE WITH DR. Marily Memos ([facility]) FOR ADMISSION. Final diagnoses:  Encephalopathy  Pleural effusion, right      Isla Pence, MD 11/23/15 1436

## 2015-11-24 DIAGNOSIS — J90 Pleural effusion, not elsewhere classified: Secondary | ICD-10-CM

## 2015-11-24 DIAGNOSIS — K7460 Unspecified cirrhosis of liver: Secondary | ICD-10-CM

## 2015-11-24 DIAGNOSIS — G9340 Encephalopathy, unspecified: Secondary | ICD-10-CM

## 2015-11-24 DIAGNOSIS — F329 Major depressive disorder, single episode, unspecified: Secondary | ICD-10-CM

## 2015-11-24 DIAGNOSIS — N183 Chronic kidney disease, stage 3 (moderate): Secondary | ICD-10-CM

## 2015-11-24 DIAGNOSIS — K7581 Nonalcoholic steatohepatitis (NASH): Secondary | ICD-10-CM

## 2015-11-24 LAB — COMPREHENSIVE METABOLIC PANEL
ALK PHOS: 94 U/L (ref 38–126)
ALT: 28 U/L (ref 17–63)
AST: 39 U/L (ref 15–41)
Albumin: 2.6 g/dL — ABNORMAL LOW (ref 3.5–5.0)
Anion gap: 5 (ref 5–15)
BILIRUBIN TOTAL: 2.7 mg/dL — AB (ref 0.3–1.2)
BUN: 32 mg/dL — AB (ref 6–20)
CO2: 25 mmol/L (ref 22–32)
CREATININE: 1.53 mg/dL — AB (ref 0.61–1.24)
Calcium: 8.9 mg/dL (ref 8.9–10.3)
Chloride: 106 mmol/L (ref 101–111)
GFR calc Af Amer: 54 mL/min — ABNORMAL LOW (ref >60)
GFR, EST NON AFRICAN AMERICAN: 46 mL/min — AB (ref >60)
GLUCOSE: 88 mg/dL (ref 65–99)
Potassium: 4.3 mmol/L (ref 3.5–5.1)
Sodium: 136 mmol/L (ref 135–145)
TOTAL PROTEIN: 5 g/dL — AB (ref 6.5–8.1)

## 2015-11-24 LAB — GLUCOSE, CAPILLARY
GLUCOSE-CAPILLARY: 78 mg/dL (ref 65–99)
GLUCOSE-CAPILLARY: 82 mg/dL (ref 65–99)
GLUCOSE-CAPILLARY: 82 mg/dL (ref 65–99)
Glucose-Capillary: 78 mg/dL (ref 65–99)
Glucose-Capillary: 96 mg/dL (ref 65–99)
Glucose-Capillary: 93 mg/dL (ref 65–99)
Glucose-Capillary: 94 mg/dL (ref 65–99)

## 2015-11-24 LAB — AMMONIA
Ammonia: 65 umol/L — ABNORMAL HIGH (ref 9–35)
Ammonia: 63 umol/L — ABNORMAL HIGH (ref 9–35)
Ammonia: 74 umol/L — ABNORMAL HIGH (ref 9–35)

## 2015-11-24 LAB — CBC
HCT: 30.9 % — ABNORMAL LOW (ref 39.0–52.0)
Hemoglobin: 10 g/dL — ABNORMAL LOW (ref 13.0–17.0)
MCH: 31.1 pg (ref 26.0–34.0)
MCHC: 32.4 g/dL (ref 30.0–36.0)
MCV: 96 fL (ref 78.0–100.0)
PLATELETS: 56 10*3/uL — AB (ref 150–400)
RBC: 3.22 MIL/uL — ABNORMAL LOW (ref 4.22–5.81)
RDW: 17.2 % — AB (ref 11.5–15.5)
WBC: 3.4 10*3/uL — AB (ref 4.0–10.5)

## 2015-11-24 LAB — CULTURE, BODY FLUID W GRAM STAIN -BOTTLE

## 2015-11-24 LAB — CULTURE, BODY FLUID-BOTTLE: Culture: NO GROWTH

## 2015-11-24 MED ORDER — LACTULOSE 10 GM/15ML PO SOLN
20.0000 g | Freq: Two times a day (BID) | ORAL | Status: DC
  Administered 2015-11-24: 20 g via ORAL
  Filled 2015-11-24: qty 30

## 2015-11-24 MED ORDER — OXYCODONE HCL 5 MG PO TABS
5.0000 mg | ORAL_TABLET | Freq: Four times a day (QID) | ORAL | Status: DC | PRN
  Administered 2015-11-25 (×2): 5 mg via ORAL
  Filled 2015-11-24 (×2): qty 1

## 2015-11-24 MED ORDER — LACTULOSE 10 GM/15ML PO SOLN
30.0000 g | Freq: Three times a day (TID) | ORAL | Status: DC
  Administered 2015-11-24 – 2015-11-25 (×3): 30 g via ORAL
  Filled 2015-11-24 (×3): qty 45

## 2015-11-24 NOTE — Progress Notes (Signed)
CSW spoke with pt wife at bedside- patient minimally responsive  Wife would like to take patient home if at all possible- states pt needs to be able to get up from bed with 1 person assist/rolling walker  Wife lives at home with pt and has been helping with chronic liver problems- feels as if she can properly care for pt and pt lactulose needs better than SNF since she has been helping him at home for a long time  Dtr lives across the street and pt cousins live nearby as well and wife states they can help  Interested in home health services- RNCM informed  CSW signing off for now but will reassess if PT eval shows pt needing more assistance than can be provided at home  Domenica Reamer, [place] Worker [phone]

## 2015-11-24 NOTE — Evaluation (Signed)
Physical Therapy Evaluation Patient Details Name: Peter Larson MRN: [id number] DOB: 1950/10/14 Today's Date: 11/24/2015   History of Present Illness  Peter Larson is a 65 y.o. male with medical history significant of sleep apnea, DM, HTN, obesity, and ASH/cirrhosis, C KD, recurrent pleural effusions presenting from nursing home with acute encephalopathy.  Clinical Impression  Patient presents with decreased independence with mobility due to deficits listed in PT problem list.  He will benefit from skilled PT in the acute setting to allow return home following continued SNF level rehab at d/c.     Follow Up Recommendations SNF;Supervision/Assistance - 24 hour    Equipment Recommendations  None recommended by PT    Recommendations for Other Services       Precautions / Restrictions Precautions Precautions: Fall      Mobility  Bed Mobility Overal bed mobility: Needs Assistance Bed Mobility: Supine to Sit     Supine to sit: Min assist;HOB elevated     General bed mobility comments: assist to lift trunk, scoot to EOB  Transfers Overall transfer level: Needs assistance Equipment used: Rolling walker (2 wheeled) Transfers: Sit to/from [facility] Sit to Stand: Min assist;+2 physical assistance Stand pivot transfers: Min assist;+2 physical assistance       General transfer comment: assist for rising from EOB to walker, stand pivot with RW 2 A for safety, one episode of knee buckling  Ambulation/Gait                Stairs            Wheelchair Mobility    Modified Rankin (Stroke Patients Only)       Balance Overall balance assessment: Needs assistance   Sitting balance-Leahy Scale: Good     Standing balance support: Bilateral upper extremity supported Standing balance-Leahy Scale: Poor Standing balance comment: UE support for balance                             Pertinent Vitals/Pain Pain Assessment: No/denies pain     Home Living Family/patient expects to be discharged to:: Skilled nursing facility Living Arrangements: Spouse/significant other Available Help at Discharge: Family Type of Home: House Home Access: Ramped entrance     Home Layout: One level Home Equipment: Cane - single point;Tub bench;Hand held shower head;Walker - 2 wheels      Prior Function Level of Independence: Independent with assistive device(s);Needs assistance   Gait / Transfers Assistance Needed: was independent prior to recent admission, from SNF this admission with continued limited ability at SNF to participate due to [place]        Extremity/Trunk Assessment   Upper Extremity Assessment: Overall WFL for tasks assessed           Lower Extremity Assessment: Generalized weakness      Cervical / Trunk Assessment: Kyphotic  Communication   Communication: No difficulties  Cognition Arousal/Alertness: Lethargic Behavior During Therapy: Flat affect Overall Cognitive Status: Impaired/Different from baseline Area of Impairment: Orientation;Problem solving Orientation Level: Place;Situation;Time;Disoriented to     Following Commands: Follows one step commands with increased time Safety/Judgement: Decreased awareness of deficits   Problem Solving: Slow processing;Requires verbal cues;Decreased initiation      General Comments      Exercises        Assessment/Plan    PT Assessment Patient needs continued PT services  PT Diagnosis Generalized  weakness;Altered mental status;Difficulty walking   PT Problem List Decreased strength;Decreased cognition;Decreased balance;Decreased mobility;Decreased activity tolerance  PT Treatment Interventions DME instruction;Gait training;Functional mobility training;Patient/family education;Therapeutic activities;Balance training;Therapeutic exercise   PT Goals (Current goals can be found in the Care Plan section) Acute Rehab PT  Goals Patient Stated Goal: to get stronger PT Goal Formulation: With patient/family Time For Goal Achievement: 12/01/15 Potential to Achieve Goals: Fair    Frequency Min 3X/week   Barriers to discharge        Co-evaluation               End of Session Equipment Utilized During Treatment: Gait belt Activity Tolerance: Patient tolerated treatment well Patient left: in chair;with call bell/phone within reach;with chair alarm set;with family/visitor present           Time: [id number] PT Time Calculation (min) (ACUTE ONLY): 21 min   Charges:   PT Evaluation $PT Eval Moderate Complexity: 1 Procedure     PT G CodesReginia Naas 2015/12/24, 1:43 PM  Magda Kiel, [place] 2015-12-24

## 2015-11-24 NOTE — NC FL2 (Signed)
[place] LEVEL OF CARE SCREENING TOOL     IDENTIFICATION  Patient Name: Peter Larson Birthdate: June 30, 1950 Sex: male Admission Date (Current Location): 11/23/2015  [facility] and [place] Number:  Herbalist and Address:  [facility] [place], [place] [address] [place], [place] [address]      Provider Number: [id number]  Attending Physician Name and Address:  Barton Dubois, MD  Relative Name and Phone Number:       Current Level of Care: Hospital Recommended Level of Care: [place] Prior Approval Number:    Date Approved/Denied:   PASRR Number: [id number]  Discharge Plan: SNF    Current Diagnoses: Patient Active Problem List   Diagnosis Date Noted  . Encephalopathy acute 11/23/2015  . Metabolic encephalopathy [id number]  . NASH (nonalcoholic steatohepatitis) 11/23/2015  . Cirrhosis ([place]) 11/23/2015  . Recurrent pleural effusion on right 11/23/2015  . Anemia, chronic disease 11/23/2015  . Insomnia 11/23/2015  . Depression 11/23/2015  . Encephalopathy 11/16/2015  . AKI (acute kidney injury) ([place]) 09/03/2015  . Watery diarrhea 09/03/2015  . Acute renal failure superimposed on stage 3 chronic kidney disease ([place])   . Diarrhea   . Chronic diastolic CHF (congestive heart failure) ([place])   . Pleural effusion, right   . Moderate mitral regurgitation 08/31/2015  . Acute on chronic diastolic CHF (congestive heart failure), NYHA class 1 ([place]) 08/31/2015  . Pleural effusion on right 07/01/2015  . CHF exacerbation ([place]) 07/01/2015  . Hepatic encephalopathy ([place]) 02/23/2014  . CKD (chronic kidney disease), stage III   . Morbid obesity ([place])   . Frequent PVCs   . Diabetes mellitus without complication ([place])   . Liver cirrhosis secondary to NASH   . Chronic diastolic congestive heart failure, NYHA class 1 ([place]) 01/30/2014  . SOB (shortness of breath) 01/29/2014  . OSA (obstructive sleep apnea) 07/16/2013  . Essential  hypertension, benign 07/16/2013  . PVC (premature ventricular contraction) 07/16/2013    Orientation RESPIRATION BLADDER Height & Weight     Self, Situation, Place  Normal External catheter Weight: (!) 136.487 kg (300 lb 14.4 oz) Height:  6\' 2"  (188 cm)  BEHAVIORAL SYMPTOMS/MOOD NEUROLOGICAL BOWEL NUTRITION STATUS      Incontinent Diet (see DC summary)  AMBULATORY STATUS COMMUNICATION OF NEEDS Skin   Extensive Assist Verbally Normal                       Personal Care Assistance Level of Assistance    Bathing Assistance: Maximum assistance Feeding assistance: Maximum assistance Dressing Assistance: Maximum assistance     Functional Limitations Info             SPECIAL CARE FACTORS FREQUENCY  PT (By licensed PT), OT (By licensed OT)     PT Frequency: 5/wk OT Frequency: 5/wk            Contractures      Additional Factors Info  Code Status Code Status Info: DNR             Current Medications (11/24/2015):  This is the current hospital active medication list Current Facility-Administered Medications  Medication Dose Route Frequency Provider Last Rate Last Dose  . cefTRIAXone (ROCEPHIN) 1 g in dextrose 5 % 50 mL IVPB  1 g Intravenous Q24H Waldemar Dickens, MD   1 g at 11/23/15 1737  . citalopram (CELEXA) tablet 10 mg  10 mg Oral Daily Waldemar Dickens, MD   10 mg  at 11/24/15 0939  . dextrose 5 %-0.45 % sodium chloride infusion   Intravenous Continuous Rhetta Mura Schorr, NP 75 mL/hr at 11/23/15 2203    . furosemide (LASIX) tablet 40 mg  40 mg Oral Daily Waldemar Dickens, MD   40 mg at 11/24/15 [id number]  . hydrOXYzine (ATARAX/VISTARIL) tablet 25 mg  25 mg Oral QHS PRN Waldemar Dickens, MD      . lactulose [facility]) 10 GM/15ML solution 30 g  30 g Oral TID Barton Dubois, MD      . metoprolol succinate (TOPROL-XL) 24 hr tablet 25 mg  25 mg Oral QHS Waldemar Dickens, MD   25 mg at 11/23/15 2213  . ondansetron (ZOFRAN) tablet 4 mg  4 mg Oral Q6H PRN Waldemar Dickens, MD        Or  . ondansetron [facility]) injection 4 mg  4 mg Intravenous Q6H PRN Waldemar Dickens, MD      . oxyCODONE (Oxy IR/ROXICODONE) immediate release tablet 5 mg  5 mg Oral Q6H PRN Barton Dubois, MD      . pantoprazole (PROTONIX) EC tablet 80 mg  80 mg Oral Q1200 Waldemar Dickens, MD   80 mg at 11/24/15 1210  . rifaximin (XIFAXAN) tablet 550 mg  550 mg Oral BID Waldemar Dickens, MD   550 mg at 11/24/15 [id number]  . sodium chloride flush (NS) 0.9 % injection 3 mL  3 mL Intravenous Q12H Waldemar Dickens, MD   3 mL at 11/24/15 1001  . spironolactone (ALDACTONE) tablet 50 mg  50 mg Oral Daily Waldemar Dickens, MD   50 mg at 11/24/15 [id number]     Discharge Medications: Please see discharge summary for a list of discharge medications.  Relevant Imaging Results:  Relevant Lab Results:   Additional Information SS # [id number]  Cranford Mon, [place]

## 2015-11-24 NOTE — Progress Notes (Signed)
CSW spoke with wife after PT eval- pt wife still wanting to take patient home if possible and hopeful pt will continue to improve rapidly  Wife was agreeable to SNF search in case pt does not improve enough to return home.  Domenica Reamer, [place] Social Worker [phone]

## 2015-11-24 NOTE — Clinical Social Work Note (Signed)
Clinical Social Work Assessment  Patient Details  Name: Peter Larson MRN: [id number] Date of Birth: [birth year]  Date of referral:  11/24/15               Reason for consult:  Facility Placement                Permission sought to share information with:  Chartered certified accountant granted to share information::  Yes, Verbal Permission Granted  Name::     Scientist, forensic::  snfs  Relationship::  wife  Contact Information:     Housing/Transportation Living arrangements for the past 2 months:  Single Family Home Source of Information:  Spouse Patient Interpreter Needed:  None Criminal Activity/Legal Involvement Pertinent to Current Situation/Hospitalization:  No - Comment as needed Significant Relationships:  Adult Children, Spouse Lives with:  Spouse Do you feel safe going back to the place where you live?  Yes Need for family participation in patient care:  Yes (Comment)  Care giving concerns:  Pt lives at home with wife- dtr lives across the street has fair amount of support but feels as if pt needs to be capable of getting up with min assist   Social Worker assessment / plan:  CSW spoke with pt wife about plan for pt at time of DC. Wife reports pt was at [place] for a few days prior to readmission- feels as if readmit is due to improper dosing of lactulose.   Wife feels as if she might need to take pt home to keep better eye on his medication management and is unsure if SNF can care for pt with complicated needs due to chronic illness.  Employment status:    Forensic scientist:  Managed Care PT Recommendations:  [place] / Referral to community resources:  [place]  Patient/Family's Response to care:  Wife wants to take patient home if possible but is agreeable to SNF search to research options if pt is needing this level of care.  Patient/Family's Understanding of and Emotional Response to Diagnosis, Current  Treatment, and Prognosis:  Pt wife is very understanding of pt condition and expressed understanding of disease progression  Emotional Assessment Appearance:  Appears stated age Attitude/Demeanor/Rapport:  Lethargic Affect (typically observed):  Appropriate Orientation:  Oriented to Self, Oriented to Place, Oriented to Situation Alcohol / Substance use:  Not Applicable Psych involvement (Current and /or in the community):  No (Comment)  Discharge Needs  Concerns to be addressed:  Care Coordination Readmission within the last 30 days:  Yes Current discharge risk:  Physical Impairment Barriers to Discharge:  Continued Medical Work up   Cranford Mon, LCSW 11/24/2015, 4:17 PM

## 2015-11-24 NOTE — Progress Notes (Signed)
[facility] PROGRESS NOTE  Peter Larson [id number] DOB: [birth year] DOA: 11/23/2015 PCP: [name], MD  Interim summary and HPI 65 y.o. male with medical history significant of sleep apnea, DM, HTN, obesity, and ASH/cirrhosis, C KD, recurrent pleural effusions presenting from nursing home with acute encephalopathy. Patient was nerves normal state of health when he was discharged from the hospital on 11/16/2015. Family reports that one day ago patient did very well especially from a nutritional standpoint. Patient's appetite was better than it has been several months. There are no focal complaints by the patient including fever, cough, nausea, diarrhea, constipation, emesis, headache, abdominal pain, dysuria or frequency. Per report patient had only 1 bowel movement since leaving the hospital. Per review of nursing home charts patient has received lactulose 30 mg 3 times a day since arrival to the facility (family felt different ans expressed that even they have charted this, in their presence lactulose was not given more than once and patient was having 1 BM daily intermittently).  Patient admitted from [facility]; family will like a different SNF if needed at discharge.  Assessment/Plan: Acute encephalopathy: multifactorial: due to UTI and hepatic encephalopathy. Ammonia 127 on admission.  No evidence of sepsis/SBP, seizure, stroke, or other etiology. This is a common recurring problem for the patient. - -continue Lactulose, now that he is more alert and ble to take PO's safely, will use 30g TID -will repeat ammonia in am -Ceftriaxone for UTI -follow culture data -continue supportive care  NASH/Cirrhosis: Pt followed by [facility]. Second admission for complication from cirrhosis in past 2 wks. MELD score 22 (20% approx with 76mo mortality). [facility] to get on [facility] at [facility].  -will continue spironolactone, lasix, xifaxin and lactulose -patient is DNR and family even optimistic,  realistic of patient situation  Pleural effusion: Recurrent. 4 admissions for treatment in past 5 months. Currently not in respiratory distress/failure. Discussed possibility of PleurX catheter w/ Cardio thoracic service -continue O2 PRN - f/u Cardio thoracic recs regarding ??PleurX??  CKD: Cr 1.69. At baseline -follow renal function trend intermittently -currently 1.53   HTN: - continue metoprolol   Anemia: 10.5. Baseline - CBC to be follow intermittently   INsomnia: - continue atarax as needed   Depression: - continue Celexa  Code Status: DNR Family Communication: sister at bedside  Disposition Plan: to be determine. Either SNF vs home with  [facility] services. Depending patient improvement    Consultants:  None   Procedures:  See below for x-ray reports   Antibiotics:  Rocephin   HPI/Subjective: Afebrile, slightly more responsive today and able to follow commands. Oriented X2.  Objective: Filed Vitals:   11/24/15 0559 11/24/15 1348  BP: 117/50 117/59  Pulse: 112 57  Temp: 98 F (36.7 C) 98.1 F (36.7 C)  Resp: 20 18    Intake/Output Summary (Last 24 hours) at 11/24/15 1829 Last data filed at 11/24/15 1757  Gross per 24 hour  Intake  642.5 ml  Output   3100 ml  Net -2457.5 ml   Filed Weights   11/23/15 1700  Weight: 136.487 kg (300 lb 14.4 oz)    Exam:   General:  Slightly more alert, oriented X2, no acute distress and afebrile  Cardiovascular: S1 and S2, no rubs or gallops  Respiratory: good air movement, no wheezing or crackles   Abdomen: soft, NT, positive BS  Musculoskeletal: 1+ edema bilaterally, no cyanosis   Data Reviewed: Basic Metabolic Panel:  Recent Labs Lab 11/19/15 0332 11/20/15 0402 11/21/15 0345 11/23/15  1245 11/24/15 0613  NA 134* 134* 136 136 136  K 5.4* 5.1 4.9 4.8 4.3  CL 105 106 106 107 106  CO2 24 24 24 23 25   GLUCOSE 86 82 88 98 88  BUN 58* 50* 45* 37* 32*  CREATININE 1.87* 1.66* 1.50* 1.69* 1.53*  CALCIUM  9.2 9.1 9.2 9.3 8.9   Liver Function Tests:  Recent Labs Lab 11/23/15 1245 11/24/15 0613  AST 59* 39  ALT 32 28  ALKPHOS 105 94  BILITOT 2.8* 2.7*  PROT 5.5* 5.0*  ALBUMIN 2.9* 2.6*   No results for input(s): LIPASE, AMYLASE in the last 168 hours.  Recent Labs Lab 11/18/15 0646 11/23/15 1245 11/23/15 2320 11/24/15 0613  AMMONIA 40* 127* 65* 63*   CBC:  Recent Labs Lab 11/19/15 0332 11/20/15 0402 11/23/15 1245 11/24/15 0613  WBC 3.9* 3.7* 3.7* 3.4*  NEUTROABS  --   --  2.3  --   HGB 9.5* 9.6* 10.5* 10.0*  HCT 27.9* 28.3* 31.4* 30.9*  MCV 91.2 93.4 91.8 96.0  PLT 47* 52* 70* 56*   BNP (last 3 results)  Recent Labs  06/30/15 1948 07/01/15 0414 09/06/15 0030  BNP 80.9 89.0 36.1   CBG:  Recent Labs Lab 11/23/15 2342 11/24/15 0403 11/24/15 0811 11/24/15 1201 11/24/15 1725  GLUCAP 85 78 82 78 82    Recent Results (from the past 240 hour(s))  MRSA PCR Screening     Status: None   Collection Time: 11/16/15  4:15 PM  Result Value Ref Range Status   MRSA by PCR NEGATIVE NEGATIVE Final    Comment:        The GeneXpert MRSA Assay (FDA approved for NASAL specimens only), is one component of a comprehensive MRSA colonization surveillance program. It is not intended to diagnose MRSA infection nor to guide or monitor treatment for MRSA infections.   Culture, body fluid-bottle     Status: None   Collection Time: 11/19/15  3:31 PM  Result Value Ref Range Status   Specimen Description FLUID PLEURAL  Final   Special Requests BOTTLES DRAWN AEROBIC AND ANAEROBIC 5CC  Final   Culture   Final    NO GROWTH 5 DAYS Performed at [facility]    Report Status 11/24/2015 FINAL  Final  Gram stain     Status: None   Collection Time: 11/19/15  3:31 PM  Result Value Ref Range Status   Specimen Description FLUID PLEURAL  Final   Special Requests NONE  Final   Gram Stain   Final    RARE WBC PRESENT,BOTH PMN AND MONONUCLEAR NO ORGANISMS SEEN Performed  at [facility]    Report Status 11/20/2015 FINAL  Final     Studies: Dg Chest 2 View  11/23/2015  CLINICAL DATA:  Altered mental status, history diabetes mellitus, hypertension EXAM: CHEST  2 VIEW COMPARISON:  11/19/2015 FINDINGS: Normal heart size mediastinal contours. Large RIGHT pleural effusion and basilar atelectasis. Decreased lung volumes. LEFT lung grossly clear. No pneumothorax. Chronic RIGHT rotator cuff tear. IMPRESSION: Large RIGHT pleural effusion with RIGHT basilar atelectasis. Electronically Signed   By: Lavonia Dana M.D.   On: 11/23/2015 13:10   Ct Head Wo Contrast  11/23/2015  CLINICAL DATA:  mental status changes, lethargy EXAM: CT HEAD WITHOUT CONTRAST TECHNIQUE: Contiguous axial images were obtained from the base of the skull through the vertex without intravenous contrast. COMPARISON:  11/16/2015 FINDINGS: Brain: No intracranial hemorrhage, mass effect or midline shift. No acute cortical infarction.  Mild cerebral atrophy. No mass lesion is noted on this unenhanced scan. No intra or extra-axial fluid collection. Vascular: No hyperdense vessel or unexpected calcification. Skull: Negative for fracture or focal lesion. Sinuses/Orbits: No acute findings. Other: None IMPRESSION: No acute intracranial abnormality. Mild cerebral atrophy. No definite acute cortical infarction. Electronically Signed   By: Lahoma Crocker M.D.   On: 11/23/2015 14:37    Scheduled Meds: . cefTRIAXone (ROCEPHIN)  IV  1 g Intravenous Q24H  . citalopram  10 mg Oral Daily  . furosemide  40 mg Oral Daily  . lactulose  30 g Oral TID  . metoprolol succinate  25 mg Oral QHS  . pantoprazole  80 mg Oral Q1200  . rifaximin  550 mg Oral BID  . sodium chloride flush  3 mL Intravenous Q12H  . spironolactone  50 mg Oral Daily   Continuous Infusions: . dextrose 5 % and 0.45% NaCl 75 mL/hr at 11/23/15 2203    Active Problems:   CKD (chronic kidney disease), stage III   Encephalopathy acute   Metabolic  encephalopathy   NASH (nonalcoholic steatohepatitis)   Cirrhosis (HCC)   Recurrent pleural effusion on right   Anemia, chronic disease   Insomnia   Depression    Time spent: 35 minutes    Barton Dubois  [facility] Pager [phone]. If 7PM-7AM, please contact night-coverage at [email or web address], password [facility] 11/24/2015, 6:29 PM  LOS: 1 day

## 2015-11-25 DIAGNOSIS — D638 Anemia in other chronic diseases classified elsewhere: Secondary | ICD-10-CM

## 2015-11-25 DIAGNOSIS — N390 Urinary tract infection, site not specified: Secondary | ICD-10-CM

## 2015-11-25 LAB — COMPREHENSIVE METABOLIC PANEL
ALBUMIN: 2.6 g/dL — AB (ref 3.5–5.0)
ALK PHOS: 103 U/L (ref 38–126)
ALT: 28 U/L (ref 17–63)
ANION GAP: 7 (ref 5–15)
AST: 43 U/L — AB (ref 15–41)
BUN: 29 mg/dL — AB (ref 6–20)
CALCIUM: 8.8 mg/dL — AB (ref 8.9–10.3)
CO2: 24 mmol/L (ref 22–32)
Chloride: 105 mmol/L (ref 101–111)
Creatinine, Ser: 1.5 mg/dL — ABNORMAL HIGH (ref 0.61–1.24)
GFR calc Af Amer: 55 mL/min — ABNORMAL LOW (ref >60)
GFR calc non Af Amer: 47 mL/min — ABNORMAL LOW (ref >60)
GLUCOSE: 92 mg/dL (ref 65–99)
POTASSIUM: 3.6 mmol/L (ref 3.5–5.1)
SODIUM: 136 mmol/L (ref 135–145)
Total Bilirubin: 2.5 mg/dL — ABNORMAL HIGH (ref 0.3–1.2)
Total Protein: 5.3 g/dL — ABNORMAL LOW (ref 6.5–8.1)

## 2015-11-25 LAB — GLUCOSE, CAPILLARY
GLUCOSE-CAPILLARY: 93 mg/dL (ref 65–99)
GLUCOSE-CAPILLARY: 87 mg/dL (ref 65–99)
Glucose-Capillary: 88 mg/dL (ref 65–99)
Glucose-Capillary: 108 mg/dL — ABNORMAL HIGH (ref 65–99)

## 2015-11-25 LAB — AMMONIA: Ammonia: 65 umol/L — ABNORMAL HIGH (ref 9–35)

## 2015-11-25 MED ORDER — TRAMADOL HCL 50 MG PO TABS
50.0000 mg | ORAL_TABLET | Freq: Once | ORAL | Status: AC
  Administered 2015-11-25: 50 mg via ORAL
  Filled 2015-11-25: qty 1

## 2015-11-25 MED ORDER — LACTULOSE 10 GM/15ML PO SOLN
40.0000 g | Freq: Three times a day (TID) | ORAL | Status: DC
  Administered 2015-11-25 – 2015-11-27 (×4): 40 g via ORAL
  Filled 2015-11-25 (×4): qty 60

## 2015-11-25 NOTE — Progress Notes (Addendum)
[facility] PROGRESS NOTE  Peter Larson [id number] DOB: September 04, 1950 DOA: 11/23/2015 PCP: [name], MD  Interim summary and HPI 65 y.o. male with medical history significant of sleep apnea, DM, HTN, obesity, and ASH/cirrhosis, C KD, recurrent pleural effusions presenting from nursing home with acute encephalopathy. Patient was nerves normal state of health when he was discharged from the hospital on 11/16/2015. Family reports that one day ago patient did very well especially from a nutritional standpoint. Patient's appetite was better than it has been several months. There are no focal complaints by the patient including fever, cough, nausea, diarrhea, constipation, emesis, headache, abdominal pain, dysuria or frequency. Per report patient had only 1 bowel movement since leaving the hospital. Per review of nursing home charts patient has received lactulose 30 mg 3 times a day since arrival to the facility (family felt different ans expressed that even they have charted this, in their presence lactulose was not given more than once and patient was having 1 BM daily intermittently).  Patient admitted from [facility]; family will like a different SNF if needed at discharge.  Assessment/Plan: Acute encephalopathy: multifactorial: due to E. Coli UTI and hepatic encephalopathy. Ammonia 127 on admission.  No evidence of sepsis/SBP, seizure, stroke, or other etiology. This is a common recurring problem for the patient. - -continue Lactulose, now that he is more alert and ble to take PO's safely, will use 40g TID -will also continue xifaxin -will repeat ammonia in am -Continue ceftriaxone for UTI -follow culture data -continue supportive care -will contact [facility] in am; as family will like if possible to transfer patient to be seen and treated by his transplant team   NASH/Cirrhosis: Pt followed by [place]. Second admission for complication from cirrhosis in past 2 wks. MELD score 22 (20% approx  with 25mo mortality). Trying to get on [facility] list at [facility].  -will continue spironolactone, lasix, xifaxin and lactulose -patient is DNR and family even optimistic, realistic of patient situation  Pleural effusion: Recurrent. 4 admissions for treatment in past 5 months. Currently not in respiratory distress/failure. Discussed possibility of PleurX catheter w/ Cardio thoracic service -continue O2 PRN - f/u Cardio thoracic recs regarding ??PleurX??  CKD: Cr 1.69. At baseline -follow renal function trend intermittently -last level 1.53   HTN: - continue metoprolol  -BP is stable and well control  Anemia: 10.5. Baseline - CBC to be follow intermittently   Insomnia: - was on atarax as needed  -medication on hold given ongoing somnolence  Depression: - continue Celexa  Code Status: DNR Family Communication: sister at bedside and wife by phone  Disposition Plan: to be determine. Either SNF vs home with [facility] services. Depending patient improvement    Consultants:  None   Procedures:  See below for x-ray reports   Antibiotics:  Rocephin   HPI/Subjective: Afebrile, no complaining of SOB or SP. Slightly more responsive today and able to follow commands. Oriented X2. Reports some suprapubic discomfort.  Objective: Filed Vitals:   11/25/15 1359 11/25/15 1955  BP: 136/81 124/84  Pulse: 91 116  Temp: 97.8 F (36.6 C) 98.4 F (36.9 C)  Resp: 20 20    Intake/Output Summary (Last 24 hours) at 11/25/15 2023 Last data filed at 11/25/15 1400  Gross per 24 hour  Intake 2648.5 ml  Output    204 ml  Net 2444.5 ml   Filed Weights   11/23/15 1700  Weight: 136.487 kg (300 lb 14.4 oz)    Exam:   General:  Slightly  more alert, oriented X2, no acute distress and afebrile. Patient is very weak and still with long periods of somnolence and lethargy. Per family still not at baseline.  Cardiovascular: S1 and S2, no rubs or gallops  Respiratory: good air movement, no  wheezing or crackles   Abdomen: soft, NT, positive BS  Musculoskeletal: 1+ edema bilaterally, no cyanosis   Data Reviewed: Basic Metabolic Panel:  Recent Labs Lab 11/20/15 0402 11/21/15 0345 11/23/15 1245 11/24/15 0613 11/25/15 0616  NA 134* 136 136 136 136  K 5.1 4.9 4.8 4.3 3.6  CL 106 106 107 106 105  CO2 24 24 23 25 24   GLUCOSE 82 88 98 88 92  BUN 50* 45* 37* 32* 29*  CREATININE 1.66* 1.50* 1.69* 1.53* 1.50*  CALCIUM 9.1 9.2 9.3 8.9 8.8*   Liver Function Tests:  Recent Labs Lab 11/23/15 1245 11/24/15 0613 11/25/15 0616  AST 59* 39 43*  ALT 32 28 28  ALKPHOS 105 94 103  BILITOT 2.8* 2.7* 2.5*  PROT 5.5* 5.0* 5.3*  ALBUMIN 2.9* 2.6* 2.6*   No results for input(s): LIPASE, AMYLASE in the last 168 hours.  Recent Labs Lab 11/23/15 1245 11/23/15 2320 11/24/15 0613 11/24/15 1903  AMMONIA 127* 65* 63* 74*   CBC:  Recent Labs Lab 11/19/15 0332 11/20/15 0402 11/23/15 1245 11/24/15 0613  WBC 3.9* 3.7* 3.7* 3.4*  NEUTROABS  --   --  2.3  --   HGB 9.5* 9.6* 10.5* 10.0*  HCT 27.9* 28.3* 31.4* 30.9*  MCV 91.2 93.4 91.8 96.0  PLT 47* 52* 70* 56*   BNP (last 3 results)  Recent Labs  06/30/15 1948 07/01/15 0414 09/06/15 0030  BNP 80.9 89.0 36.1   CBG:  Recent Labs Lab 11/24/15 1944 11/24/15 2328 11/25/15 0756 11/25/15 1221 11/25/15 1619  GLUCAP 93 94 88 108* 93    Recent Results (from the past 240 hour(s))  MRSA PCR Screening     Status: None   Collection Time: 11/16/15  4:15 PM  Result Value Ref Range Status   MRSA by PCR NEGATIVE NEGATIVE Final    Comment:        The GeneXpert MRSA Assay (FDA approved for NASAL specimens only), is one component of a comprehensive MRSA colonization surveillance program. It is not intended to diagnose MRSA infection nor to guide or monitor treatment for MRSA infections.   Culture, body fluid-bottle     Status: None   Collection Time: 11/19/15  3:31 PM  Result Value Ref Range Status   Specimen  Description FLUID PLEURAL  Final   Special Requests BOTTLES DRAWN AEROBIC AND ANAEROBIC 5CC  Final   Culture   Final    NO GROWTH 5 DAYS Performed at [facility]    Report Status 11/24/2015 FINAL  Final  Gram stain     Status: None   Collection Time: 11/19/15  3:31 PM  Result Value Ref Range Status   Specimen Description FLUID PLEURAL  Final   Special Requests NONE  Final   Gram Stain   Final    RARE WBC PRESENT,BOTH PMN AND MONONUCLEAR NO ORGANISMS SEEN Performed at [facility]    Report Status 11/20/2015 FINAL  Final  Culture, Urine     Status: Abnormal (Preliminary result)   Collection Time: 11/23/15  1:29 PM  Result Value Ref Range Status   Specimen Description URINE, CLEAN CATCH  Final   Special Requests ADDED [id number] [phone]  Final   Culture >=100,000 COLONIES/mL ESCHERICHIA COLI (  A)  Final   Report Status PENDING  Incomplete     Studies: No results found.  Scheduled Meds: . cefTRIAXone (ROCEPHIN)  IV  1 g Intravenous Q24H  . citalopram  10 mg Oral Daily  . furosemide  40 mg Oral Daily  . lactulose  40 g Oral TID  . metoprolol succinate  25 mg Oral QHS  . pantoprazole  80 mg Oral Q1200  . rifaximin  550 mg Oral BID  . sodium chloride flush  3 mL Intravenous Q12H  . spironolactone  50 mg Oral Daily   Continuous Infusions: . dextrose 5 % and 0.45% NaCl 75 mL/hr at 11/25/15 1305    Active Problems:   CKD (chronic kidney disease), stage III   Encephalopathy acute   Metabolic encephalopathy   NASH (nonalcoholic steatohepatitis)   Cirrhosis (HCC)   Recurrent pleural effusion on right   Anemia, chronic disease   Insomnia   Depression    Time spent: 35 minutes    Barton Dubois  [facility] Pager [phone]. If 7PM-7AM, please contact night-coverage at [email or web address], password [facility] 11/25/2015, 8:23 PM  LOS: 2 days

## 2015-11-25 NOTE — Care Management Note (Signed)
Case Management Note  Patient Details  Name: Peter Larson MRN: [id number] Date of Birth: 1951/03/28  Subjective/Objective:                 Patient comes from [facility] with AMS and hepatic encephalopathy. Spoke with patient's wife over the phone to discuss DC needs, as she is at work and comes to the hospital in the evenings. She states that she believes the patient's treatment plan upon discharge should be daily lactulose adjustments based on daily ammonia levels. She was told by [facility] that they could not provide that level of care and theerfore does not want patient to return there at DC and wanted him to come home. CM reinforced to wife that daily titration of medication based on daily blood draws is not a typical outpatient treatment plan for hepatic encephalopathy at a SNF or [place] level of care. CM stated that patients do not always get daily ammonia levels even while in the hospital. Wife stated that she would refer to his gastro at [facility] for their opinion.She wants patient to DC to home with care given by family while she is at work. CM voiced concerns to her about amount of daily care that patient would need. He is 300 pounds and unable to get out of bed without assist of 2 PT at this time. Family in room stated that they will leaving on Saturday.  CM will meet with or call wife again tomorrow to establish dispo plan. Dr Dyann Kief updated on dispo planning as of now.    Action/Plan:   Expected Discharge Date:  11/26/15               Expected Discharge Plan:  [place]  In-House Referral:  Clinical Social Work  Discharge planning Services     Post Acute Care Choice:    Choice offered to:  Spouse Peter Larson, wife)  DME Arranged:    DME Agency:     HH Arranged:  RN, PT, OT, Nurse's Aide, Social Work [employer] Agency:     Status of Service:  In process, will continue to follow  If discussed at Long Length of Stay Meetings, dates discussed:    Additional Comments:  Carles Collet, RN 11/25/2015, 3:43 PM

## 2015-11-25 NOTE — Progress Notes (Signed)
CSW spoke with patient's wife over the phone. She states that she does not want him to return to [facility] since they are not able to continuously monitor patient and adjust his medications constantly. She would like to take him home with family support since she knows his care routine. CSW to alert RNCM for home health needs.  CSW signing off.  [name] [name] LCSWA [phone]

## 2015-11-25 NOTE — Progress Notes (Signed)
Pt with some beats of vtach. Provider [place] notified. Pt in no apparent distress and asymptomatic. Will continue to monitor pt

## 2015-11-25 NOTE — Progress Notes (Signed)
RT  NOTE:  Pt has brought in home CPAP. He stated he can manage it on his own. RT will monitor.

## 2015-11-26 DIAGNOSIS — G4733 Obstructive sleep apnea (adult) (pediatric): Secondary | ICD-10-CM

## 2015-11-26 LAB — URINE CULTURE: Culture: >=100000 — AB

## 2015-11-26 LAB — BASIC METABOLIC PANEL
ANION GAP: 6 (ref 5–15)
BUN: 27 mg/dL — ABNORMAL HIGH (ref 6–20)
CALCIUM: 8.4 mg/dL — AB (ref 8.9–10.3)
CHLORIDE: 104 mmol/L (ref 101–111)
CO2: 22 mmol/L (ref 22–32)
CREATININE: 1.42 mg/dL — AB (ref 0.61–1.24)
GFR calc non Af Amer: 51 mL/min — ABNORMAL LOW (ref >60)
GFR, EST AFRICAN AMERICAN: 59 mL/min — AB (ref >60)
GLUCOSE: 106 mg/dL — AB (ref 65–99)
Potassium: 3.7 mmol/L (ref 3.5–5.1)
Sodium: 132 mmol/L — ABNORMAL LOW (ref 135–145)

## 2015-11-26 LAB — GLUCOSE, CAPILLARY
GLUCOSE-CAPILLARY: 99 mg/dL (ref 65–99)
GLUCOSE-CAPILLARY: 71 mg/dL (ref 65–99)
GLUCOSE-CAPILLARY: 73 mg/dL (ref 65–99)
Glucose-Capillary: 104 mg/dL — ABNORMAL HIGH (ref 65–99)
Glucose-Capillary: 111 mg/dL — ABNORMAL HIGH (ref 65–99)
Glucose-Capillary: 94 mg/dL (ref 65–99)

## 2015-11-26 LAB — MAGNESIUM: Magnesium: 1.5 mg/dL — ABNORMAL LOW (ref 1.7–2.4)

## 2015-11-26 MED ORDER — MAGNESIUM SULFATE 2 GM/50ML IV SOLN
2.0000 g | Freq: Once | INTRAVENOUS | Status: AC
  Administered 2015-11-26: 2 g via INTRAVENOUS
  Filled 2015-11-26: qty 50

## 2015-11-26 MED ORDER — CEFUROXIME AXETIL 500 MG PO TABS
500.0000 mg | ORAL_TABLET | Freq: Two times a day (BID) | ORAL | Status: DC
  Administered 2015-11-26 – 2015-11-27 (×2): 500 mg via ORAL
  Filled 2015-11-26 (×5): qty 1

## 2015-11-26 MED ORDER — SIMETHICONE 40 MG/0.6ML PO SUSP
40.0000 mg | Freq: Four times a day (QID) | ORAL | Status: DC | PRN
  Administered 2015-11-26 – 2015-11-27 (×3): 40 mg via ORAL
  Filled 2015-11-26 (×6): qty 0.6

## 2015-11-26 MED ORDER — TRAMADOL HCL 50 MG PO TABS
50.0000 mg | ORAL_TABLET | Freq: Once | ORAL | Status: AC
  Administered 2015-11-26: 50 mg via ORAL
  Filled 2015-11-26: qty 1

## 2015-11-26 NOTE — Progress Notes (Signed)
Placed patient on his home CPAP for the night.  Patient is tolerating well at this time. 

## 2015-11-26 NOTE — Progress Notes (Signed)
Physical Therapy Treatment Patient Details Name: LYNELL MANSOOR MRN: [id number] DOB: 1951/04/02 Today's Date: 11/26/2015    History of Present Illness ADHAM BORREGO is a 65 y.o. male with medical history significant of sleep apnea, DM, HTN, obesity, and ASH/cirrhosis, C KD, recurrent pleural effusions presenting from nursing home with acute encephalopathy.    PT Comments    Pt making slow, steady progress however continue to recommend ST-SNF due to fact he is requiring assist with all mobility and currently requires assist of two people. If wife elects to take him home he currently will need 24 physical assist and two people if getting OOB.  Follow Up Recommendations  SNF;Supervision/Assistance - 24 hour     Equipment Recommendations  None recommended by PT    Recommendations for Other Services       Precautions / Restrictions Precautions Precautions: Fall Restrictions Weight Bearing Restrictions: No    Mobility  Bed Mobility Overal bed mobility: Needs Assistance Bed Mobility: Supine to Sit     Supine to sit: +2 for physical assistance;Mod assist;HOB elevated     General bed mobility comments: Assist to move legs off bed and elevate trunk into sitting  Transfers Overall transfer level: Needs assistance Equipment used: Rolling walker (2 wheeled) Transfers: Sit to/from Stand Sit to Stand: +2 physical assistance;Min assist;From elevated surface         General transfer comment: Assist to bring hips up and for balance. Verbal cues for hand placement.  Ambulation/Gait Ambulation/Gait assistance: +2 physical assistance;Min assist Ambulation Distance (Feet): 75 Feet Assistive device: Rolling walker (2 wheeled) Gait Pattern/deviations: Step-through pattern;Decreased step length - right;Decreased step length - left;Trunk flexed;Wide base of support Gait velocity: decr Gait velocity interpretation: Below normal speed for age/gender General Gait Details: Verbal cues to  stand more erect and stay closer to walker. Assist for balance and support.   Stairs            Wheelchair Mobility    Modified Rankin (Stroke Patients Only)       Balance Overall balance assessment: Needs assistance Sitting-balance support: No upper extremity supported;Feet supported Sitting balance-Leahy Scale: Good     Standing balance support: Bilateral upper extremity supported Standing balance-Leahy Scale: Poor Standing balance comment: walker and min A for static standing                    Cognition Arousal/Alertness: Awake/alert Behavior During Therapy: WFL for tasks assessed/performed Overall Cognitive Status: Within Functional Limits for tasks assessed                      Exercises      General Comments        Pertinent Vitals/Pain Pain Assessment: No/denies pain    Home Living                      Prior Function            PT Goals (current goals can now be found in the care plan section) Progress towards PT goals: Progressing toward goals    Frequency  Min 3X/week    PT Plan Current plan remains appropriate    Co-evaluation             End of Session Equipment Utilized During Treatment: Gait belt Activity Tolerance: Patient tolerated treatment well Patient left: in chair;with call bell/phone within reach;with chair alarm set;with family/visitor present     Time: [id number] PT Time Calculation (min) (  ACUTE ONLY): 27 min  Charges:  $Gait Training: 23-37 mins                    G Codes:      Silverio Hagan 12/03/2015, 12:55 PM [facility] PT [phone]

## 2015-11-26 NOTE — Progress Notes (Signed)
CSW spoke with patient's wife again. She is insistent on taking patient home. She states she is aware of risks and of patient's weight, but she chooses to take him home.   [name] LCSWA [phone]

## 2015-11-26 NOTE — Progress Notes (Signed)
[facility] PROGRESS NOTE  Peter Larson [id number] DOB: [birth year] DOA: 11/23/2015 PCP: [name], MD  Interim summary and HPI 65 y.o. male with medical history significant of sleep apnea, DM, HTN, obesity, and ASH/cirrhosis, C KD, recurrent pleural effusions presenting from nursing home with acute encephalopathy. Patient was nerves normal state of health when he was discharged from the hospital on 11/16/2015. Family reports that one day ago patient did very well especially from a nutritional standpoint. Patient's appetite was better than it has been several months. There are no focal complaints by the patient including fever, cough, nausea, diarrhea, constipation, emesis, headache, abdominal pain, dysuria or frequency. Per report patient had only 1 bowel movement since leaving the hospital. Per review of nursing home charts patient has received lactulose 30 mg 3 times a day since arrival to the facility (family felt different ans expressed that even they have charted this, in their presence lactulose was not given more than once and patient was having 1 BM daily intermittently).  Patient admitted from [facility]; family will like a different SNF if needed at discharge.  Assessment/Plan: Acute encephalopathy: multifactorial: due to E. Coli UTI and hepatic encephalopathy. Ammonia 127 on admission.  No evidence of sepsis/SBP, seizure, stroke, or other etiology. This is a common recurring problem for the patient. - -continue Lactulose 40g TID (patient with 4-5 BM's daily) -will use simethicone to assist with cramps and obstipation  -will continue xifaxin BID -ammonia level 64 today -Continue antibiotics, but will transition to ceftin to complete therapy (anticipated last day 6/27) -continue supportive care -I have contacted [facility] service and they felt not need for transfer currently; continue current treatment and outpatient follow up as previously scheduled.  NASH/Cirrhosis: Pt followed  by [facility]. Second admission for complication from cirrhosis in past 2 wks. MELD score 22 (20% approx with 108mo mortality). Trying to get on transplant list at [facility].  -will continue spironolactone, lasix, xifaxin and lactulose (dose adjusted as mentioned above)  Pleural effusion: Recurrent. 4 admissions for treatment in past 5 months. Currently not in respiratory distress/failure.  -Discussed possibility of PleurX catheter w/ Cardio thoracic service; but recommendations given to continue PRN thoracentesis for now. -continue O2 PRN  OSA: -continue CPAP  CKD: Cr 1.69. At baseline -follow renal function trend intermittently -last level 1.42   HTN: -continue metoprolol  -BP is stable and well control  Anemia: 10.5. Baseline -CBC to be follow intermittently   Insomnia: -was on atarax as needed  -medication on hold given ongoing somnolence; will continue holding   Depression: -Continue Celexa  Code Status: DNR Family Communication: sister at bedside and wife by phone  Disposition Plan: to be determine. Either SNF vs home with [facility] services. Depending patient improvement    Consultants:  None   Procedures:  See below for x-ray reports   Antibiotics:  Rocephin 6/20>>6/23  Ceftin 6/23>>6/27  HPI/Subjective: Afebrile, no complaining of SOB or CP. AAOX3 today and able to follow commands properly. Patient achieving 4-5 BM's per day. Weak and deconditioned.  Objective: Filed Vitals:   11/26/15 0546 11/26/15 0550  BP: 93/46 121/59  Pulse: 55   Temp: 97.7 F (36.5 C)   Resp: 15     Intake/Output Summary (Last 24 hours) at 11/26/15 1113 Last data filed at 11/26/15 0900  Gross per 24 hour  Intake   1078 ml  Output    206 ml  Net    872 ml   Filed Weights   11/23/15 1700  Weight: 136.487  kg (300 lb 14.4 oz)    Exam:   General:  Slightly more alert, oriented X3, no acute distress and afebrile. Patient is weak and deconditioned, requiring 2 person assist to change  positions and standing. Able to follow commands properly and even instructing/directing his assistance about work for his company.   Cardiovascular: S1 and S2, no rubs or gallops  Respiratory: good air movement, no wheezing or crackles   Abdomen: soft, NT, positive BS  Musculoskeletal: 1+ edema bilaterally, no cyanosis   Data Reviewed: Basic Metabolic Panel:  Recent Labs Lab 11/21/15 0345 11/23/15 1245 11/24/15 0613 11/25/15 0616 11/26/15 0526  NA 136 136 136 136 132*  K 4.9 4.8 4.3 3.6 3.7  CL 106 107 106 105 104  CO2 24 23 25 24 22   GLUCOSE 88 98 88 92 106*  BUN 45* 37* 32* 29* 27*  CREATININE 1.50* 1.69* 1.53* 1.50* 1.42*  CALCIUM 9.2 9.3 8.9 8.8* 8.4*  MG  --   --   --   --  1.5*   Liver Function Tests:  Recent Labs Lab 11/23/15 1245 11/24/15 0613 11/25/15 0616  AST 59* 39 43*  ALT 32 28 28  ALKPHOS 105 94 103  BILITOT 2.8* 2.7* 2.5*  PROT 5.5* 5.0* 5.3*  ALBUMIN 2.9* 2.6* 2.6*   No results for input(s): LIPASE, AMYLASE in the last 168 hours.  Recent Labs Lab 11/23/15 1245 11/23/15 2320 11/24/15 0613 11/24/15 1903 11/25/15 2049  AMMONIA 127* 65* 63* 74* 65*   CBC:  Recent Labs Lab 11/20/15 0402 11/23/15 1245 11/24/15 0613  WBC 3.7* 3.7* 3.4*  NEUTROABS  --  2.3  --   HGB 9.6* 10.5* 10.0*  HCT 28.3* 31.4* 30.9*  MCV 93.4 91.8 96.0  PLT 52* 70* 56*   BNP (last 3 results)  Recent Labs  06/30/15 1948 07/01/15 0414 09/06/15 0030  BNP 80.9 89.0 36.1   CBG:  Recent Labs Lab 11/25/15 1619 11/25/15 1954 11/26/15 0045 11/26/15 0414 11/26/15 0814  GLUCAP 93 87 111* 104* 94    Recent Results (from the past 240 hour(s))  MRSA PCR Screening     Status: None   Collection Time: 11/16/15  4:15 PM  Result Value Ref Range Status   MRSA by PCR NEGATIVE NEGATIVE Final    Comment:        The GeneXpert MRSA Assay (FDA approved for NASAL specimens only), is one component of a comprehensive MRSA colonization surveillance program. It is  not intended to diagnose MRSA infection nor to guide or monitor treatment for MRSA infections.   Culture, body fluid-bottle     Status: None   Collection Time: 11/19/15  3:31 PM  Result Value Ref Range Status   Specimen Description FLUID PLEURAL  Final   Special Requests BOTTLES DRAWN AEROBIC AND ANAEROBIC 5CC  Final   Culture   Final    NO GROWTH 5 DAYS Performed at [facility]    Report Status 11/24/2015 FINAL  Final  Gram stain     Status: None   Collection Time: 11/19/15  3:31 PM  Result Value Ref Range Status   Specimen Description FLUID PLEURAL  Final   Special Requests NONE  Final   Gram Stain   Final    RARE WBC PRESENT,BOTH PMN AND MONONUCLEAR NO ORGANISMS SEEN Performed at [facility]    Report Status 11/20/2015 FINAL  Final  Culture, Urine     Status: Abnormal   Collection Time: 11/23/15  1:29 PM  Result Value Ref Range Status   Specimen Description URINE, CLEAN CATCH  Final   Special Requests ADDED [id number]  Final   Culture >=100,000 COLONIES/mL ESCHERICHIA COLI (A)  Final   Report Status 11/26/2015 FINAL  Final   Organism ID, Bacteria ESCHERICHIA COLI (A)  Final      Susceptibility   Escherichia coli - MIC*    AMPICILLIN 4 SENSITIVE Sensitive     CEFAZOLIN <=4 SENSITIVE Sensitive     CEFTRIAXONE <=1 SENSITIVE Sensitive     CIPROFLOXACIN <=0.25 SENSITIVE Sensitive     GENTAMICIN <=1 SENSITIVE Sensitive     IMIPENEM <=0.25 SENSITIVE Sensitive     NITROFURANTOIN <=16 SENSITIVE Sensitive     TRIMETH/SULFA <=20 SENSITIVE Sensitive     AMPICILLIN/SULBACTAM <=2 SENSITIVE Sensitive     PIP/TAZO <=4 SENSITIVE Sensitive     * >=100,000 COLONIES/mL ESCHERICHIA COLI     Studies: No results found.  Scheduled Meds: . cefUROXime  500 mg Oral BID WC  . citalopram  10 mg Oral Daily  . furosemide  40 mg Oral Daily  . lactulose  40 g Oral TID  . metoprolol succinate  25 mg Oral QHS  . pantoprazole  80 mg Oral Q1200  . rifaximin  550 mg Oral BID   . sodium chloride flush  3 mL Intravenous Q12H  . spironolactone  50 mg Oral Daily   Continuous Infusions: . dextrose 5 % and 0.45% NaCl 75 mL/hr at 11/26/15 [id number]    Active Problems:   CKD (chronic kidney disease), stage III   Encephalopathy acute   Metabolic encephalopathy   NASH (nonalcoholic steatohepatitis)   Cirrhosis (HCC)   Recurrent pleural effusion on right   Anemia, chronic disease   Insomnia   Depression    Time spent: 35 minutes    Barton Dubois  [facility] Pager [phone]. If 7PM-7AM, please contact night-coverage at [email or web address], password [facility] 11/26/2015, 11:13 AM  LOS: 3 days

## 2015-11-27 LAB — CBC
HEMATOCRIT: 32.3 % — AB (ref 39.0–52.0)
HEMOGLOBIN: 10.6 g/dL — AB (ref 13.0–17.0)
MCH: 30.5 pg (ref 26.0–34.0)
MCHC: 32.8 g/dL (ref 30.0–36.0)
MCV: 93.1 fL (ref 78.0–100.0)
Platelets: 62 10*3/uL — ABNORMAL LOW (ref 150–400)
RBC: 3.47 MIL/uL — AB (ref 4.22–5.81)
RDW: 16.5 % — ABNORMAL HIGH (ref 11.5–15.5)
WBC: 4.6 10*3/uL (ref 4.0–10.5)

## 2015-11-27 LAB — GLUCOSE, CAPILLARY
GLUCOSE-CAPILLARY: 58 mg/dL — AB (ref 65–99)
GLUCOSE-CAPILLARY: 87 mg/dL (ref 65–99)
GLUCOSE-CAPILLARY: 77 mg/dL (ref 65–99)
GLUCOSE-CAPILLARY: 92 mg/dL (ref 65–99)
Glucose-Capillary: 69 mg/dL (ref 65–99)
Glucose-Capillary: 87 mg/dL (ref 65–99)

## 2015-11-27 LAB — MAGNESIUM: Magnesium: 1.7 mg/dL (ref 1.7–2.4)

## 2015-11-27 MED ORDER — HYDROXYZINE HCL 25 MG PO TABS: 12.5000 mg | ORAL_TABLET | Freq: Every evening | ORAL | Status: AC | PRN

## 2015-11-27 MED ORDER — CEFUROXIME AXETIL 500 MG PO TABS: 500.0000 mg | ORAL_TABLET | Freq: Two times a day (BID) | ORAL | Status: AC

## 2015-11-27 MED ORDER — LACTULOSE 10 GM/15ML PO SOLN: 30.0000 g | Freq: Three times a day (TID) | ORAL | Status: AC

## 2015-11-27 MED ORDER — FUROSEMIDE 40 MG PO TABS: 40.0000 mg | ORAL_TABLET | Freq: Every day | ORAL | Status: DC

## 2015-11-27 MED ORDER — MAGNESIUM OXIDE 400 (241.3 MG) MG PO TABS: 400.0000 mg | ORAL_TABLET | Freq: Two times a day (BID) | ORAL | Status: DC

## 2015-11-27 NOTE — Progress Notes (Signed)
Hypoglycemic Event  CBG 58  Treatment: 15 GM carbohydrate snack  Symptoms: None  Follow-up CBG: Time:0440 CBG Result:69  Possible Reasons for Event: Inadequate meal intake  Comments/MD notified:Patient did not drink his juice as requested.  Will retake CBG in 15 min. At [place]  CBG now [place]

## 2015-11-27 NOTE — Progress Notes (Signed)
Patient discharge teaching given, including activity, diet, follow up appointment, and mediations. Patient verbalized understanding of all discharge instructions. IV access was discontinued. Vitals are stable, Skin intact except as charted in most recent assessments. Patient to be escorted out by NT, to be driven home by family.

## 2015-11-27 NOTE — Discharge Summary (Signed)
Physician Discharge Summary  Peter Larson [id number] DOB: [birth year] DOA: 11/23/2015  PCP: [name], MD  Admit date: 11/23/2015 Discharge date: 11/27/2015  Time spent: 35 minutes  Recommendations for Outpatient Follow-up:  Repeat CBC to assess hgb trend Repeat CMET to follow LFT's, electrolytes and renal function   Discharge Diagnoses:  Active Problems:   CKD (chronic kidney disease), stage III   Encephalopathy acute   Metabolic encephalopathy   NASH (nonalcoholic steatohepatitis)   Cirrhosis (HCC)   Recurrent pleural effusion on right   Anemia, chronic disease   Insomnia   Depression Obesity   Discharge Condition: stable and improved. Discharge home with [facility] services as requested by family. Will follow up with PCP in 10 days and with gastroenterology/liver transplant group next week as previously scheduled.  Diet recommendation: low sodium and low calorie diet   Filed Weights   11/23/15 1700  Weight: 136.487 kg (300 lb 14.4 oz)    History of present illness:  65 y.o. male with medical history significant of sleep apnea, DM, HTN, obesity, and ASH/cirrhosis, C KD, recurrent pleural effusions presenting from nursing home with acute encephalopathy. Patient was nerves normal state of health when he was discharged from the hospital on 11/16/2015. Family reports that one day ago patient did very well especially from a nutritional standpoint. Patient's appetite was better than it has been several months. There are no focal complaints by the patient including fever, cough, nausea, diarrhea, constipation, emesis, headache, abdominal pain, dysuria or frequency. Per report patient had only 1 bowel movement since leaving the hospital. Per review of nursing home charts patient has received lactulose 30 mg 3 times a day since arrival to the facility (family felt different ans expressed that even they have charted this, in their presence lactulose was not given more than once and patient  was having 1 BM daily intermittently).  Hospital Course:  Acute encephalopathy: multifactorial: due to UTI and hepatic encephalopathy. Ammonia 127 on admission.  No evidence of sepsis/SBP, seizure, stroke, or other etiology. This is a common recurring problem for the patient.  -continue Lactulose 30g TID (patient's family to titrate as needed to achieve goal of 4-5 BM's daily) -will continue to use nexium for reflux and simethicone to assist with cramps and obstipation  -will continue xifaxan BID -ammonia level 64 at discharge -Continue antibiotics, but will transition to ceftin to complete therapy (anticipated last day 6/27) -I have contacted [facility] service and they felt not need for transfer currently; continue current treatment and outpatient follow up as previously scheduled.  NASH/Cirrhosis: Pt followed by [facility]. Second admission for complication from cirrhosis in past 2 wks. MELD score 22 (20% approx with 63mo mortality). Trying to get on transplant list at [facility].  -will continue spironolactone, lasix, xifaxin and lactulose -patient and family even optimistic, realistic of patient situation -continue outpatient follow up at [facility] with gastroenterology/liver specialist service   Pleural effusion: Recurrent. 4 admissions for treatment in past 5 months. Currently not in respiratory distress/failure. Discussed possibility of PleurX catheter w/ Cardio thoracic service -for now plan is to continue PRN thoracentesis  -will discussed with liver specialist/gastroenterology group at [facility] and if re-accumulation to frequent Dr. Servando Snare ok to place pleurex catheter at that time  OSA -continue CPAP at bedtime  CKD: Cr 1.69. At baseline -follow renal function trend intermittently to assess renal function  -currently 1.42   HTN: -continue metoprolol  -BP is stable and well controlled  Anemia: 10.5. Baseline -recommend CBC to be follow intermittently  to assess Hgb trend  INsomnia: - continue  atarax as needed (dose adjusted to prevent oversedation, given decrease liver function)  Depression: - continue Celexa  Obesity -Body mass index is 38.62 kg/(m^2). -patient advise to follow low calorie diet and to increase physical activity to lose weight and improve muscular mass/strenght   Procedures:  See below for x-ray reports   Consultations:  CVTS (Dr. Servando Snare)  Discharge Exam: Filed Vitals:   11/26/15 2300 11/27/15 0621  BP: 111/51 108/52  Pulse: 104 55  Temp: 98.7 F (37.1 C) 99 F (37.2 C)  Resp: 18 17    General: Significantly improved mentation. Per family at baseline. Oriented X3, no acute distress and afebrile. Patient is weak and deconditioned, requiring 2 person assist to change positions and standing.   Cardiovascular: S1 and S2, no rubs or gallops  Respiratory: good air movement, no wheezing or crackles; good O2 sat on RA  Abdomen: soft, NT, positive BS  Musculoskeletal: 1+ edema bilaterally, no cyanosis   Discharge Instructions   Discharge Instructions    Diet - low sodium heart healthy    Complete by:  As directed      Discharge instructions    Complete by:  As directed   Take medications as prescribed Follow balanced healthy diet (and control portions for extra proteins, sodium and calories intake) Maintain adequate hydration Follow up with gastroenterology/Liver service as scheduled  Arrange follow up with PCP in 10 days          Current Discharge Medication List    START taking these medications   Details  cefUROXime (CEFTIN) 500 MG tablet Take 1 tablet (500 mg total) by mouth 2 (two) times daily with a meal. Qty: 7 tablet, Refills: 0    magnesium oxide (MAGOX 400) 400 (241.3 Mg) MG tablet Take 1 tablet (400 mg total) by mouth 2 (two) times daily. Qty: 60 tablet, Refills: 1      CONTINUE these medications which have CHANGED   Details  furosemide (LASIX) 40 MG tablet Take 1 tablet (40 mg total) by mouth daily.     hydrOXYzine (ATARAX/VISTARIL) 25 MG tablet Take 0.5 tablets (12.5 mg total) by mouth at bedtime as needed for anxiety (insomnia).    lactulose (CHRONULAC) 10 GM/15ML solution Take 45 mLs (30 g total) by mouth 3 (three) times daily. Please increase up to 40g three times a day if not achieving 4-5 BM daily or patient mentation is worsening. Qty: 473 mL, Refills: 0      CONTINUE these medications which have NOT CHANGED   Details  cholecalciferol (VITAMIN D) 1000 UNITS tablet Take 2,000 Units by mouth every morning.     citalopram (CELEXA) 10 MG tablet Take 1 tablet by mouth daily.    esomeprazole (NEXIUM) 40 MG capsule Take 40 mg by mouth every evening.     metoprolol succinate (TOPROL-XL) 25 MG 24 hr tablet Take 1 tablet by mouth at bedtime.    Multiple Vitamin (MULTIVITAMIN) tablet Take 1 tablet by mouth every morning.     rifaximin (XIFAXAN) 550 MG TABS tablet Take 1 tablet (550 mg total) by mouth 2 (two) times daily. Qty: 60 tablet, Refills: 0    simethicone (MYLICON) 80 MG chewable tablet Chew 1 tablet (80 mg total) by mouth 2 (two) times daily.    spironolactone (ALDACTONE) 50 MG tablet Take 1 tablet (50 mg total) by mouth daily. Qty: 30 tablet, Refills: 0    zinc sulfate 220 MG capsule Take 220 mg by  mouth daily.       No Known Allergies Follow-up Information    Follow up with [name], MD. Schedule an appointment as soon as possible for a visit in 10 days.   Specialty:  Family Medicine   Contact information:   [address] [place] [id number] [phone]        The results of significant diagnostics from this hospitalization (including imaging, microbiology, ancillary and laboratory) are listed below for reference.    Significant Diagnostic Studies: Dg Chest 1 View  11/19/2015  CLINICAL DATA:  Status post right thoracentesis today. Postprocedural examination. EXAM: CHEST 1 VIEW COMPARISON:  Single-view of the chest earlier today.  FINDINGS: Right pleural effusion is decreased after thoracentesis. No pneumothorax is identified. The left lung is clear. No left pleural effusion is identified. There is cardiomegaly. IMPRESSION: Decreased right pleural effusion after thoracentesis. Negative for pneumothorax. Electronically Signed   By: Inge Rise M.D.   On: 11/19/2015 15:21   Dg Chest 1 View  11/17/2015  CLINICAL DATA:  Shortness of breath EXAM: CHEST 1 VIEW COMPARISON:  09/06/2015 FINDINGS: The tail like opacification of the entire right lung is new from the previous exam and is favored to represent enlarging right pleural effusion. Left lung is clear. There is pulmonary vascular congestion. IMPRESSION: 1. Veil like opacification of the right lung. Unchanged from previous exam. Electronically Signed   By: Kerby Moors M.D.   On: 11/17/2015 08:45   Dg Chest 2 View  11/23/2015  CLINICAL DATA:  Altered mental status, history diabetes mellitus, hypertension EXAM: CHEST  2 VIEW COMPARISON:  11/19/2015 FINDINGS: Normal heart size mediastinal contours. Large RIGHT pleural effusion and basilar atelectasis. Decreased lung volumes. LEFT lung grossly clear. No pneumothorax. Chronic RIGHT rotator cuff tear. IMPRESSION: Large RIGHT pleural effusion with RIGHT basilar atelectasis. Electronically Signed   By: Lavonia Dana M.D.   On: 11/23/2015 13:10   Ct Head Wo Contrast  11/23/2015  CLINICAL DATA:  mental status changes, lethargy EXAM: CT HEAD WITHOUT CONTRAST TECHNIQUE: Contiguous axial images were obtained from the base of the skull through the vertex without intravenous contrast. COMPARISON:  11/16/2015 FINDINGS: Brain: No intracranial hemorrhage, mass effect or midline shift. No acute cortical infarction. Mild cerebral atrophy. No mass lesion is noted on this unenhanced scan. No intra or extra-axial fluid collection. Vascular: No hyperdense vessel or unexpected calcification. Skull: Negative for fracture or focal lesion. Sinuses/Orbits: No  acute findings. Other: None IMPRESSION: No acute intracranial abnormality. Mild cerebral atrophy. No definite acute cortical infarction. Electronically Signed   By: Lahoma Crocker M.D.   On: 11/23/2015 14:37   Ct Head Wo Contrast  11/16/2015  CLINICAL DATA:  Altered mental status EXAM: CT HEAD WITHOUT CONTRAST TECHNIQUE: Contiguous axial images were obtained from the base of the skull through the vertex without intravenous contrast. COMPARISON:  02/12/2014 FINDINGS: Ventricle size normal.  Cerebral volume normal for age. Negative for acute or chronic infarction. Negative for hemorrhage or mass lesion. No fluid collection or midline shift. Normal calvarium. IMPRESSION: Negative Electronically Signed   By: Franchot Gallo M.D.   On: 11/16/2015 13:40   US Renal  11/16/2015  CLINICAL DATA:  Acute renal failure EXAM: RENAL / URINARY TRACT ULTRASOUND COMPLETE COMPARISON:  November 19, 2013 FINDINGS: Right Kidney: Length: 11.9 cm. Echogenicity within normal limits. There is renal cortical thinning. No mass perinephric fluid, or hydronephrosis visualized. No sonographically demonstrable calculus or ureterectasis. Left Kidney: Length: 12.2 cm. Echogenicity within normal limits.  There is renal cortical thinning. No mass, perinephric fluid, or hydronephrosis visualized. No sonographically demonstrable calculus or ureterectasis. Bladder: Appears normal for degree of bladder distention. There is ascites in the right upper abdomen. The contour and echotexture of the liver are indicative of underlying cirrhosis. IMPRESSION: There is bilateral renal cortical thinning, a finding associated with medical renal disease. The renal echogenicity is normal. No obstructing foci are identified in either kidney. There is ascites. The contour and echotexture of the liver are consistent with underlying hepatic cirrhosis. Electronically Signed   By: Lowella Grip III M.D.   On: 11/16/2015 16:03   Dg Chest Port 1 View  11/19/2015  CLINICAL  DATA:  Shortness of breath EXAM: PORTABLE CHEST 1 VIEW COMPARISON:  11/17/2015 chest radiograph. FINDINGS: Stable cardiomediastinal silhouette with normal heart size. No pneumothorax. Moderate right pleural effusion, not appreciably changed. No left pleural effusion. Patchy opacity throughout the right lung, not appreciably changed. No acute consolidative airspace disease in the left lung. IMPRESSION: Stable moderate right pleural effusion and patchy opacity throughout the right lung, favoring atelectasis, with a component of pneumonia or underlying lung mass not excluded. Electronically Signed   By: Ilona Sorrel M.D.   On: 11/19/2015 11:29   Dg Shoulder Left  11/03/2015  CLINICAL DATA:  Multiple falls with left shoulder pain and bruising. EXAM: LEFT SHOULDER - 2+ VIEW COMPARISON:  Chest radiograph 09/06/2015 FINDINGS: Left shoulder is located. Areas of lucency in the left humeral head without definite fracture. Left AC joint is intact. Visualized left ribs are intact. IMPRESSION: No acute abnormality in left shoulder. Electronically Signed   By: Markus Daft M.D.   On: 11/03/2015 17:08   US Thoracentesis Asp Pleural Space W/img Guide  11/19/2015  INDICATION: 65 year old with fatty liver disease with a right pleural effusion. Request is made today for a diagnostic and therapeutic thoracentesis EXAM: ULTRASOUND GUIDED DIAGNOSTIC AND THERAPEUTIC THORACENTESIS MEDICATIONS: 1% lidocaine COMPLICATIONS: None immediate. PROCEDURE: An ultrasound guided thoracentesis was thoroughly discussed with the patient and his wife and questions answered. The benefits, risks, alternatives and complications were also discussed. The patient's wife understands and wishes to proceed with the procedure. Written consent was obtained. Ultrasound was performed to localize and mark an adequate pocket of fluid in the right chest. The area was then prepped and draped in the normal sterile fashion. 1% Lidocaine was used for local anesthesia. A  Safe-T-Centesis catheter was introduced. Thoracentesis was performed. The catheter was removed and a dressing applied. FINDINGS: A total of approximately 1.5 L of yellow fluid was removed. Samples were sent to the laboratory as requested by the clinical team. IMPRESSION: Successful ultrasound guided right thoracentesis yielding 1.5 L of pleural fluid. Given this was the patient's first thoracentesis, the procedure was terminated at 1.5 L. There was a small residual amount of effusion remaining on ultrasound imaging. Read by: Saverio Danker, PA-C Electronically Signed   By: Marybelle Killings M.D.   On: 11/19/2015 15:52    Microbiology: Recent Results (from the past 240 hour(s))  Culture, body fluid-bottle     Status: None   Collection Time: 11/19/15  3:31 PM  Result Value Ref Range Status   Specimen Description FLUID PLEURAL  Final   Special Requests BOTTLES DRAWN AEROBIC AND ANAEROBIC 5CC  Final   Culture   Final    NO GROWTH 5 DAYS Performed at [facility]    Report Status 11/24/2015 FINAL  Final  Gram stain     Status: None  Collection Time: 11/19/15  3:31 PM  Result Value Ref Range Status   Specimen Description FLUID PLEURAL  Final   Special Requests NONE  Final   Gram Stain   Final    RARE WBC PRESENT,BOTH PMN AND MONONUCLEAR NO ORGANISMS SEEN Performed at [facility]    Report Status 11/20/2015 FINAL  Final  Culture, Urine     Status: Abnormal   Collection Time: 11/23/15  1:29 PM  Result Value Ref Range Status   Specimen Description URINE, CLEAN CATCH  Final   Special Requests ADDED [id number] [phone]  Final   Culture >=100,000 COLONIES/mL ESCHERICHIA COLI (A)  Final   Report Status 11/26/2015 FINAL  Final   Organism ID, Bacteria ESCHERICHIA COLI (A)  Final      Susceptibility   Escherichia coli - MIC*    AMPICILLIN 4 SENSITIVE Sensitive     CEFAZOLIN <=4 SENSITIVE Sensitive     CEFTRIAXONE <=1 SENSITIVE Sensitive     CIPROFLOXACIN <=0.25 SENSITIVE Sensitive      GENTAMICIN <=1 SENSITIVE Sensitive     IMIPENEM <=0.25 SENSITIVE Sensitive     NITROFURANTOIN <=16 SENSITIVE Sensitive     TRIMETH/SULFA <=20 SENSITIVE Sensitive     AMPICILLIN/SULBACTAM <=2 SENSITIVE Sensitive     PIP/TAZO <=4 SENSITIVE Sensitive     * >=100,000 COLONIES/mL ESCHERICHIA COLI     Labs: Basic Metabolic Panel:  Recent Labs Lab 11/21/15 0345 11/23/15 1245 11/24/15 0613 11/25/15 0616 11/26/15 0526 11/27/15 0548  NA 136 136 136 136 132*  --   K 4.9 4.8 4.3 3.6 3.7  --   CL 106 107 106 105 104  --   CO2 24 23 25 24 22   --   GLUCOSE 88 98 88 92 106*  --   BUN 45* 37* 32* 29* 27*  --   CREATININE 1.50* 1.69* 1.53* 1.50* 1.42*  --   CALCIUM 9.2 9.3 8.9 8.8* 8.4*  --   MG  --   --   --   --  1.5* 1.7   Liver Function Tests:  Recent Labs Lab 11/23/15 1245 11/24/15 0613 11/25/15 0616  AST 59* 39 43*  ALT 32 28 28  ALKPHOS 105 94 103  BILITOT 2.8* 2.7* 2.5*  PROT 5.5* 5.0* 5.3*  ALBUMIN 2.9* 2.6* 2.6*    Recent Labs Lab 11/23/15 1245 11/23/15 2320 11/24/15 0613 11/24/15 1903 11/25/15 2049  AMMONIA 127* 65* 63* 74* 65*   CBC:  Recent Labs Lab 11/23/15 1245 11/24/15 0613 11/27/15 0548  WBC 3.7* 3.4* 4.6  NEUTROABS 2.3  --   --   HGB 10.5* 10.0* 10.6*  HCT 31.4* 30.9* 32.3*  MCV 91.8 96.0 93.1  PLT 70* 56* 62*   BNP (last 3 results)  Recent Labs  06/30/15 1948 07/01/15 0414 09/06/15 0030  BNP 80.9 89.0 36.1   CBG:  Recent Labs Lab 11/27/15 0418 11/27/15 0452 11/27/15 0525 11/27/15 0829 11/27/15 1226  GLUCAP 58* 69 87 77 92    Signed:  Barton Dubois MD.  [facility] 11/27/2015, 1:22 PM

## 2015-11-27 NOTE — Progress Notes (Signed)
CM spoke with pt's wife as pt is indisposed and wife states they choose [facility] to render HHPT/OT.  Referral called to [facility] rep, Tiffany.  No other CM needs were communicated.

## 2015-12-14 ENCOUNTER — Emergency Department (HOSPITAL_COMMUNITY): Payer: 59

## 2015-12-14 ENCOUNTER — Inpatient Hospital Stay (HOSPITAL_COMMUNITY): Payer: 59

## 2015-12-14 ENCOUNTER — Encounter (HOSPITAL_COMMUNITY): Payer: Self-pay

## 2015-12-14 ENCOUNTER — Inpatient Hospital Stay (HOSPITAL_COMMUNITY)
Admission: EM | Admit: 2015-12-14 | Discharge: 2015-12-16 | DRG: 442 | Disposition: A | Discharge status: Other Acute Inpatient Hospital | Payer: 59 | Attending: Internal Medicine | Admitting: Internal Medicine

## 2015-12-14 DIAGNOSIS — R161 Splenomegaly, not elsewhere classified: Secondary | ICD-10-CM | POA: Diagnosis present

## 2015-12-14 DIAGNOSIS — N183 Chronic kidney disease, stage 3 unspecified: Secondary | ICD-10-CM | POA: Diagnosis present

## 2015-12-14 DIAGNOSIS — I77819 Aortic ectasia, unspecified site: Secondary | ICD-10-CM | POA: Diagnosis not present

## 2015-12-14 DIAGNOSIS — I10 Essential (primary) hypertension: Secondary | ICD-10-CM | POA: Diagnosis present

## 2015-12-14 DIAGNOSIS — K7290 Hepatic failure, unspecified without coma: Secondary | ICD-10-CM | POA: Diagnosis present

## 2015-12-14 DIAGNOSIS — D696 Thrombocytopenia, unspecified: Secondary | ICD-10-CM | POA: Diagnosis present

## 2015-12-14 DIAGNOSIS — K7581 Nonalcoholic steatohepatitis (NASH): Secondary | ICD-10-CM | POA: Diagnosis not present

## 2015-12-14 DIAGNOSIS — G4733 Obstructive sleep apnea (adult) (pediatric): Secondary | ICD-10-CM | POA: Diagnosis not present

## 2015-12-14 DIAGNOSIS — R188 Other ascites: Secondary | ICD-10-CM | POA: Diagnosis present

## 2015-12-14 DIAGNOSIS — B957 Other staphylococcus as the cause of diseases classified elsewhere: Secondary | ICD-10-CM | POA: Diagnosis not present

## 2015-12-14 DIAGNOSIS — Z8379 Family history of other diseases of the digestive system: Secondary | ICD-10-CM

## 2015-12-14 DIAGNOSIS — Z9889 Other specified postprocedural states: Secondary | ICD-10-CM | POA: Diagnosis present

## 2015-12-14 DIAGNOSIS — J918 Pleural effusion in other conditions classified elsewhere: Secondary | ICD-10-CM | POA: Diagnosis present

## 2015-12-14 DIAGNOSIS — N179 Acute kidney failure, unspecified: Secondary | ICD-10-CM | POA: Diagnosis not present

## 2015-12-14 DIAGNOSIS — R7881 Bacteremia: Secondary | ICD-10-CM | POA: Diagnosis not present

## 2015-12-14 DIAGNOSIS — J948 Other specified pleural conditions: Secondary | ICD-10-CM | POA: Diagnosis present

## 2015-12-14 DIAGNOSIS — E860 Dehydration: Secondary | ICD-10-CM | POA: Diagnosis present

## 2015-12-14 DIAGNOSIS — I130 Hypertensive heart and chronic kidney disease with heart failure and stage 1 through stage 4 chronic kidney disease, or unspecified chronic kidney disease: Secondary | ICD-10-CM | POA: Diagnosis present

## 2015-12-14 DIAGNOSIS — J189 Pneumonia, unspecified organism: Secondary | ICD-10-CM

## 2015-12-14 DIAGNOSIS — D638 Anemia in other chronic diseases classified elsewhere: Secondary | ICD-10-CM | POA: Diagnosis not present

## 2015-12-14 DIAGNOSIS — Z800 Family history of malignant neoplasm of digestive organs: Secondary | ICD-10-CM

## 2015-12-14 DIAGNOSIS — E875 Hyperkalemia: Secondary | ICD-10-CM | POA: Diagnosis not present

## 2015-12-14 DIAGNOSIS — I959 Hypotension, unspecified: Secondary | ICD-10-CM | POA: Diagnosis not present

## 2015-12-14 DIAGNOSIS — R6510 Systemic inflammatory response syndrome (SIRS) of non-infectious origin without acute organ dysfunction: Secondary | ICD-10-CM | POA: Diagnosis present

## 2015-12-14 DIAGNOSIS — Z79899 Other long term (current) drug therapy: Secondary | ICD-10-CM | POA: Diagnosis not present

## 2015-12-14 DIAGNOSIS — N189 Chronic kidney disease, unspecified: Secondary | ICD-10-CM

## 2015-12-14 DIAGNOSIS — E1122 Type 2 diabetes mellitus with diabetic chronic kidney disease: Secondary | ICD-10-CM | POA: Diagnosis present

## 2015-12-14 DIAGNOSIS — Z6838 Body mass index (BMI) 38.0-38.9, adult: Secondary | ICD-10-CM | POA: Diagnosis not present

## 2015-12-14 DIAGNOSIS — K7200 Acute and subacute hepatic failure without coma: Principal | ICD-10-CM | POA: Diagnosis present

## 2015-12-14 DIAGNOSIS — R1310 Dysphagia, unspecified: Secondary | ICD-10-CM | POA: Diagnosis not present

## 2015-12-14 DIAGNOSIS — K7682 Hepatic encephalopathy: Secondary | ICD-10-CM | POA: Diagnosis present

## 2015-12-14 DIAGNOSIS — K7460 Unspecified cirrhosis of liver: Secondary | ICD-10-CM | POA: Diagnosis present

## 2015-12-14 DIAGNOSIS — I5032 Chronic diastolic (congestive) heart failure: Secondary | ICD-10-CM | POA: Diagnosis not present

## 2015-12-14 DIAGNOSIS — J90 Pleural effusion, not elsewhere classified: Secondary | ICD-10-CM | POA: Diagnosis present

## 2015-12-14 LAB — I-STAT TROPONIN, ED: Troponin i, poc: 0 ng/mL (ref 0.00–0.08)

## 2015-12-14 LAB — GRAM STAIN

## 2015-12-14 LAB — URINALYSIS, ROUTINE W REFLEX MICROSCOPIC
BILIRUBIN URINE: NEGATIVE
GLUCOSE, UA: NEGATIVE mg/dL
HGB URINE DIPSTICK: NEGATIVE
Ketones, ur: 15 mg/dL — AB
Leukocytes, UA: NEGATIVE
Nitrite: NEGATIVE
PROTEIN: NEGATIVE mg/dL
Specific Gravity, Urine: 1.025 (ref 1.005–1.030)
pH: 5 (ref 5.0–8.0)

## 2015-12-14 LAB — BODY FLUID CELL COUNT WITH DIFFERENTIAL
Eos, Fluid: 0 %
LYMPHS FL: 53 %
Monocyte-Macrophage-Serous Fluid: 35 % — ABNORMAL LOW (ref 50–90)
NEUTROPHIL FLUID: 12 % (ref 0–25)
WBC FLUID: 197 uL (ref 0–1000)

## 2015-12-14 LAB — APTT: APTT: 36 s (ref 24–37)

## 2015-12-14 LAB — CBC WITH DIFFERENTIAL/PLATELET
BASOS ABS: 0 10*3/uL (ref 0.0–0.1)
BASOS PCT: 0 %
EOS ABS: 0 10*3/uL (ref 0.0–0.7)
Eosinophils Relative: 0 %
HCT: 33.9 % — ABNORMAL LOW (ref 39.0–52.0)
Hemoglobin: 11.3 g/dL — ABNORMAL LOW (ref 13.0–17.0)
Lymphocytes Relative: 9 %
Lymphs Abs: 1.6 10*3/uL (ref 0.7–4.0)
MCH: 31.4 pg (ref 26.0–34.0)
MCHC: 33.3 g/dL (ref 30.0–36.0)
MCV: 94.2 fL (ref 78.0–100.0)
MONO ABS: 1.1 10*3/uL — AB (ref 0.1–1.0)
Monocytes Relative: 6 %
NEUTROS ABS: 14.8 10*3/uL — AB (ref 1.7–7.7)
NEUTROS PCT: 85 %
PLATELETS: 71 10*3/uL — AB (ref 150–400)
RBC: 3.6 MIL/uL — ABNORMAL LOW (ref 4.22–5.81)
RDW: 16.9 % — AB (ref 11.5–15.5)
WBC: 17.5 10*3/uL — ABNORMAL HIGH (ref 4.0–10.5)

## 2015-12-14 LAB — I-STAT ARTERIAL BLOOD GAS, ED
ACID-BASE DEFICIT: 6 mmol/L — AB (ref 0.0–2.0)
Bicarbonate: 17.7 mEq/L — ABNORMAL LOW (ref 20.0–24.0)
O2 SAT: 75 %
PCO2 ART: 29.2 mmHg — AB (ref 35.0–45.0)
PH ART: 7.393 (ref 7.350–7.450)
TCO2: 19 mmol/L (ref 0–100)
pO2, Arterial: 40 mmHg — ABNORMAL LOW (ref 80.0–100.0)

## 2015-12-14 LAB — COMPREHENSIVE METABOLIC PANEL
ALBUMIN: 2.7 g/dL — AB (ref 3.5–5.0)
ALT: 27 U/L (ref 17–63)
ANION GAP: 7 (ref 5–15)
AST: 47 U/L — AB (ref 15–41)
Alkaline Phosphatase: 119 U/L (ref 38–126)
BILIRUBIN TOTAL: 2.9 mg/dL — AB (ref 0.3–1.2)
BUN: 37 mg/dL — ABNORMAL HIGH (ref 6–20)
CHLORIDE: 105 mmol/L (ref 101–111)
CO2: 19 mmol/L — AB (ref 22–32)
Calcium: 8.7 mg/dL — ABNORMAL LOW (ref 8.9–10.3)
Creatinine, Ser: 1.85 mg/dL — ABNORMAL HIGH (ref 0.61–1.24)
GFR calc Af Amer: 43 mL/min — ABNORMAL LOW (ref >60)
GFR calc non Af Amer: 37 mL/min — ABNORMAL LOW (ref >60)
GLUCOSE: 117 mg/dL — AB (ref 65–99)
POTASSIUM: 5.4 mmol/L — AB (ref 3.5–5.1)
SODIUM: 131 mmol/L — AB (ref 135–145)
TOTAL PROTEIN: 5.6 g/dL — AB (ref 6.5–8.1)

## 2015-12-14 LAB — PROCALCITONIN: Procalcitonin: 0.72 ng/mL

## 2015-12-14 LAB — AMMONIA: Ammonia: 91 umol/L — ABNORMAL HIGH (ref 9–35)

## 2015-12-14 LAB — PROTEIN, BODY FLUID: Total protein, fluid: <3 g/dL

## 2015-12-14 LAB — LACTATE DEHYDROGENASE, PLEURAL OR PERITONEAL FLUID: LD FL: 49 U/L — AB (ref 3–23)

## 2015-12-14 LAB — MRSA PCR SCREENING: MRSA by PCR: NEGATIVE

## 2015-12-14 LAB — LACTIC ACID, PLASMA
LACTIC ACID, VENOUS: 2.8 mmol/L — AB (ref 0.5–1.9)
Lactic Acid, Venous: 2.1 mmol/L (ref 0.5–1.9)

## 2015-12-14 LAB — PROTIME-INR
INR: 1.81 — AB (ref 0.00–1.49)
PROTHROMBIN TIME: 20.9 s — AB (ref 11.6–15.2)

## 2015-12-14 LAB — PHOSPHORUS: PHOSPHORUS: 3.7 mg/dL (ref 2.5–4.6)

## 2015-12-14 LAB — I-STAT CG4 LACTIC ACID, ED
LACTIC ACID, VENOUS: 2.71 mmol/L — AB (ref 0.5–1.9)
Lactic Acid, Venous: 2.51 mmol/L (ref 0.5–1.9)

## 2015-12-14 LAB — MAGNESIUM: Magnesium: 2.4 mg/dL (ref 1.7–2.4)

## 2015-12-14 LAB — LACTATE DEHYDROGENASE: LDH: 208 U/L — ABNORMAL HIGH (ref 98–192)

## 2015-12-14 MED ORDER — SODIUM CHLORIDE 0.9 % IV BOLUS (SEPSIS)
1000.0000 mL | Freq: Once | INTRAVENOUS | Status: AC
  Administered 2015-12-14: 1000 mL via INTRAVENOUS

## 2015-12-14 MED ORDER — ALBUMIN HUMAN 5 % IV SOLN
25.0000 g | Freq: Once | INTRAVENOUS | Status: AC
  Administered 2015-12-14: 25 g via INTRAVENOUS
  Filled 2015-12-14 (×2): qty 500

## 2015-12-14 MED ORDER — LIDOCAINE HCL 1 % IJ SOLN
INTRAMUSCULAR | Status: AC
  Filled 2015-12-14: qty 20

## 2015-12-14 MED ORDER — SODIUM CHLORIDE 0.9 % IV SOLN
2000.0000 mg | Freq: Once | INTRAVENOUS | Status: AC
  Administered 2015-12-14: 2000 mg via INTRAVENOUS
  Filled 2015-12-14: qty 2000

## 2015-12-14 MED ORDER — SODIUM CHLORIDE 0.9 % IV SOLN
1500.0000 mg | INTRAVENOUS | Status: DC
  Administered 2015-12-15 – 2015-12-16 (×2): 1500 mg via INTRAVENOUS
  Filled 2015-12-14 (×3): qty 1500

## 2015-12-14 MED ORDER — SODIUM CHLORIDE 0.9 % IV SOLN
INTRAVENOUS | Status: AC
  Administered 2015-12-14 (×2): via INTRAVENOUS

## 2015-12-14 MED ORDER — LACTULOSE ENEMA
300.0000 mL | Freq: Once | ORAL | Status: AC
  Administered 2015-12-14: 300 mL via RECTAL
  Filled 2015-12-14: qty 300

## 2015-12-14 MED ORDER — PIPERACILLIN-TAZOBACTAM 3.375 G IVPB
3.3750 g | Freq: Three times a day (TID) | INTRAVENOUS | Status: DC
  Administered 2015-12-14: 3.375 g via INTRAVENOUS
  Filled 2015-12-14 (×3): qty 50

## 2015-12-14 MED ORDER — SODIUM CHLORIDE 0.9% FLUSH
3.0000 mL | Freq: Two times a day (BID) | INTRAVENOUS | Status: DC
  Administered 2015-12-14 – 2015-12-16 (×5): 3 mL via INTRAVENOUS

## 2015-12-14 MED ORDER — FOLIC ACID 5 MG/ML IJ SOLN
1.0000 mg | Freq: Every day | INTRAMUSCULAR | Status: DC
  Administered 2015-12-14 – 2015-12-15 (×2): 1 mg via INTRAVENOUS
  Filled 2015-12-14 (×2): qty 0.2

## 2015-12-14 MED ORDER — LACTULOSE 10 GM/15ML PO SOLN
30.0000 g | Freq: Two times a day (BID) | ORAL | Status: DC
  Administered 2015-12-14 – 2015-12-15 (×2): 30 g via ORAL
  Filled 2015-12-14 (×2): qty 45

## 2015-12-14 MED ORDER — LACTULOSE ENEMA
300.0000 mL | Freq: Two times a day (BID) | ORAL | Status: DC
  Administered 2015-12-14: 300 mL via RECTAL
  Filled 2015-12-14 (×3): qty 300

## 2015-12-14 MED ORDER — SODIUM CHLORIDE 0.9 % IV SOLN
INTRAVENOUS | Status: DC
  Administered 2015-12-14: 06:00:00 via INTRAVENOUS

## 2015-12-14 MED ORDER — VANCOMYCIN HCL 10 G IV SOLR: 1500.0000 mg | INTRAVENOUS | Status: DC

## 2015-12-14 MED ORDER — THIAMINE HCL 100 MG/ML IJ SOLN
100.0000 mg | Freq: Every day | INTRAMUSCULAR | Status: DC
  Administered 2015-12-14 – 2015-12-15 (×2): 100 mg via INTRAVENOUS
  Filled 2015-12-14 (×2): qty 2

## 2015-12-14 MED ORDER — PIPERACILLIN-TAZOBACTAM 3.375 G IVPB
3.3750 g | Freq: Three times a day (TID) | INTRAVENOUS | Status: DC
  Administered 2015-12-14 – 2015-12-15 (×3): 3.375 g via INTRAVENOUS
  Filled 2015-12-14 (×4): qty 50

## 2015-12-14 MED ORDER — ALBUMIN HUMAN 5 % IV SOLN
25.0000 g | Freq: Once | INTRAVENOUS | Status: AC
Start: 2015-12-14 — End: 2015-12-14
  Administered 2015-12-14: 25 g via INTRAVENOUS
  Filled 2015-12-14: qty 500

## 2015-12-14 NOTE — Progress Notes (Addendum)
Received call from interventional radiology physician assistant who reports a total of 1.7 L cloudy colored fluid obtained but during procedure patient's blood pressure dropped to 75/39 so procedure was terminated. Will give albumin 5% 25 g IV 2 bottles today   1711: Serum LDH has not been collected so reordered as a stat  Erin Hearing, ANP

## 2015-12-14 NOTE — Progress Notes (Signed)
Pharmacy Antibiotic Note  Peter Larson is a 65 y.o. male admitted on 12/14/2015 with altered mental status.  Pharmacy has been consulted for Vancomycin/Zosyn dosing for possible PNA. WBC is elevated, noted renal dysfunction.   Plan: -Vancomycin 2000 mg IV x 1, then 1500 mg IV q24h -Zosyn 3.375G IV q8h to be infused over 4 hours -Trend WBC, temp, renal function  -Drug levels as indicated   Temp (24hrs), Avg:99.6 F (37.6 C), Min:99.6 F (37.6 C), Max:99.6 F (37.6 C)   Recent Labs Lab 12/14/15 0303 12/14/15 0322  WBC 17.5*  --   CREATININE 1.85*  --   LATICACIDVEN  --  2.51*    CrCl cannot be calculated (Unknown ideal weight.).    No Known Allergies  Narda Bonds 12/14/2015 5:01 AM

## 2015-12-14 NOTE — Progress Notes (Signed)
This is a no charge note  Pending on admission per Dr. Leonides Schanz.  65 year old male with  past medical history of [place], diastolic CHF, HCV, cirrhosis, CKD, OSA, hypertension, GERD, depression, who presents with altered mental status and confusion. Patient does not have abdominal pain per EDP. Ammonia level 91, lactate 2.51, INR 1.81, negative urinalysis, temperature 99.6, blood pressure 129/55, potassium 5.4 without EKG change, negative CT head for acute intracranial abnormalities. Chest x-ray showed persistent right pleural effusion with atelectasis versus infiltration. Patient was given  30 g of lactulose for possible hepatic encephalopathy. One dose of vanco and Zosyn were given by EDP. Pt is accepted to tele bed as inpt.  Ivor Costa, MD  [facility] Pager [phone]  If 7PM-7AM, please contact night-coverage [email or web address] Password [email or web address] 12/14/2015, 5:20 AM

## 2015-12-14 NOTE — H&P (Signed)
History and Physical    Peter Larson [id number] DOB: 1950-06-10 DOA: 12/14/2015   PCP: [name], MD   Patient coming from/Resides with: Private residence/lives with wife  Chief Complaint: Acute altered mentation  HPI: Peter Larson is a 65 y.o. male with a past medical history of known cirrhosis and multiple recurrent hospitalizations in the past 6-8 months for acute hepatic encephalopathy not associated with acute decompensated cirrhosis, obesity, sleep apnea, history of high output stools on lactulose, chronic kidney disease stage III, chronic diastolic congestive heart failure grade 1, hypertension who presents to the hospital with less than 24 hours of acute altered mentation in his usual typical pattern. Patient was at baseline mentation yesterday and was actually able to go to work as usual but as the afternoon progressed when patient returned home from work he was more confused. Wife gave an extra dose of lactulose without improvement in patient's symptoms. By this morning patient very lethargic although able to open eyes and talk. Wife reports patient not yet on official transplant list at [facility] although apparently a multidisciplinary team meeting is planned for today to discuss the possibility of transitioning him to the transplant list since he has had multiple repeated hospitalizations for encephalopathy not controlled by medications.  ED Course:  Vital signs: PO Temp 99.6 rectal-BP 136/64-pulse 94-respirations 24-room air saturations 96-97% CT head without contrast: Negative mildly motion degraded CT of the head Lab data: Sodium 131, potassium 5.4, CO2 19, BUN 37, creatinine 1.5, glucose 117, albumin 2.7, AST 47, ammonia 91, total bilirubin 2.9, troponin 0.00, lactic acid has been persistently elevated 3 collections in the ER (2.51, 2.8, 2.71), Procalcitonin 0.72, WBC 17,500 with left shift neutrophils 85% and absolute neutrophils 14.8%, hemoglobin 11.3, platelets 71,000; PT  20.9, INR 1.81, PTT 36, urinalysis mildly abnormal with amber color and 15 ketones and slightly concentrated specific gravity 1.025; blood cultures obtained in the ER. Medications and treatments: Normal saline bolus 1 L, Chronulac 300 mL rectal 1, vancomycin 2 g IV 1, Zosyn 3.375 g IV 1,  Review of Systems:  In addition to the HPI above,  No Fever-chills, myalgias or other constitutional symptoms No Headache, changes with Vision or hearing, new weakness, tingling, numbness in any extremity, No problems swallowing food or Liquids, indigestion/reflux No Chest pain, Cough or Shortness of Breath, palpitations, orthopnea or DOE No Abdominal pain, N/V; no melena or hematochezia, no dark tarry stools, Bowel movements are regular and loose secondary to lactulose No dysuria, hematuria or flank pain No new skin rashes, lesions, masses or bruises, No new joints pains-aches No recent weight gain or loss No polyuria, polydypsia or polyphagia,   Past Medical History  Diagnosis Date  . Splenomegaly     slt thrombocytopenia;no complics. egd [id number] neg for varices, ?mild portal gastropathy; 02/2011 nl,novarices  . 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 Hypertension   . Adenomatous colon polyp '01 & '08  . Hemorrhoids     Severe anorectal pain and anal fissure w bleeding following hemorrhoidectomy may 2010  . Family history of colon cancer     mother --29  . Seasonal allergies   . Screening     Hepatocellular Screening CT neg 05/2008, U/S neg 04/2009,05/2010  . Dysphagia     BaS/tab neg 8/09; egd's neg for ring/strict--?dysmotility  . Chronic diastolic congestive heart failure ([place])     a.  ECHO 01/13/14: EF 55-60%; moderate LVH, G2DD, biatrial enlargement; mild RVE; moderate MR; mildly  elevated, pulm pressures.  . Morbid obesity ([place])   . Frequent PVCs   . Pancytopenia ([place])   . Cirrhosis ([place])     a. Due to NASH, followed by [facility].  Marland Kitchen Shortness of breath     . Arthritis     RA  " ALL OVER "  . Dilatation of aorta ([facility])     a. Mild aortic root dilitation by echo 01/13/14  . Chronic hepatitis ([place])     cirrhosis  . Hepatic encephalopathy ([place])   . CKD (chronic kidney disease)   . Pleural effusion 08/2015  . NASH (nonalcoholic steatohepatitis) dx'd 2007  . OSA on CPAP     Past Surgical History  Procedure Laterality Date  . Anal fissure repair    . Hemorrhoid surgery    . Esophagogastroduodenoscopy (egd) with propofol N/A 03/11/2015    Procedure: ESOPHAGOGASTRODUODENOSCOPY (EGD) WITH PROPOFOL;  Surgeon: Ronald Lobo, MD;  Location: [facility];  Service: Endoscopy;  Laterality: N/A;  . Colonoscopy with propofol N/A 03/11/2015    Procedure: COLONOSCOPY WITH PROPOFOL;  Surgeon: Ronald Lobo, MD;  Location: [facility];  Service: Endoscopy;  Laterality: N/A;    Social History   Social History  . Marital Status: Married    Spouse Name: N/A  . Number of Children: N/A  . Years of Education: N/A   Occupational History  . Not on file.   Social History Main Topics  . Smoking status: Never Smoker   . Smokeless tobacco: Never Used  . Alcohol Use: Yes     Comment: 11/23/2015  "none since 2007's dx fatty liver disease"  . Drug Use: No  . Sexual Activity: Not on file   Other Topics Concern  . 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 Cancer Mother     died of breast cancer  . Liver disease Father   . Thyroid disease Sister      Prior to Admission medications   Medication Sig Start Date End Date Taking? Authorizing Provider  cholecalciferol (VITAMIN D) 1000 UNITS tablet Take 2,000 Units by mouth every morning.    Yes Historical Provider, MD  citalopram (CELEXA) 10 MG tablet Take 1 tablet by mouth daily. 09/30/15 09/29/16 Yes Historical Provider, MD   esomeprazole (NEXIUM) 40 MG capsule Take 40 mg by mouth every evening.    Yes Historical Provider, MD  famotidine (PEPCID) 20 MG tablet Take 20 mg by mouth daily as needed for heartburn or indigestion.   Yes Historical Provider, MD  furosemide (LASIX) 40 MG tablet Take 1 tablet (40 mg total) by mouth daily. 11/27/15  Yes Barton Dubois, MD  hydrOXYzine (ATARAX/VISTARIL) 25 MG tablet Take 0.5 tablets (12.5 mg total) by mouth at bedtime as needed for anxiety (insomnia). Patient taking differently: Take 25 mg by mouth every 8 (eight) hours as needed for itching.  11/27/15  Yes Barton Dubois, MD  lactulose (CHRONULAC) 10 GM/15ML solution Take 45 mLs (30 g total) by mouth 3 (three) times daily. Please increase up to 40g three times a day if not achieving 4-5 BM daily or patient mentation is worsening. 11/27/15  Yes Barton Dubois, MD  magnesium oxide (MAGOX 400) 400 (241.3 Mg) MG tablet Take 1 tablet (400 mg total) by mouth 2 (two) times daily. 11/27/15  Yes Clifton James  Dyann Kief, MD  metoprolol succinate (TOPROL-XL) 25 MG 24 hr tablet Take 1 tablet by mouth at bedtime. 10/27/15 10/26/16 Yes Historical Provider, MD  milk thistle 175 MG tablet Take 350 mg by mouth daily.   Yes Historical Provider, MD  Multiple Vitamin (MULTIVITAMIN) tablet Take 1 tablet by mouth every morning.    Yes Historical Provider, MD  rifaximin (XIFAXAN) 550 MG TABS tablet Take 1 tablet (550 mg total) by mouth 2 (two) times daily. 02/13/14  Yes Geradine Girt, DO  simethicone (MYLICON) 80 MG chewable tablet Chew 1 tablet (80 mg total) by mouth 2 (two) times daily. Patient taking differently: Chew 160 mg by mouth 4 (four) times daily as needed for flatulence.  07/03/15  Yes Modena Jansky, MD  spironolactone (ALDACTONE) 50 MG tablet Take 1 tablet (50 mg total) by mouth daily. Patient taking differently: Take 100 mg by mouth daily.  11/21/15  Yes Kelvin Cellar, MD  zinc sulfate 220 MG capsule Take 220 mg by mouth daily. 08/17/15 08/16/16 Yes  Historical Provider, MD    Physical Exam: Filed Vitals:   12/14/15 0722 12/14/15 0730 12/14/15 0830 12/14/15 0920  BP:  108/50 121/44 90/51  Pulse:  82 79 73  Temp: 99.2 F (37.3 C)     TempSrc: Oral     Resp:  20 20 16   SpO2:  98% 99% 97%      Constitutional: Lethargic but nontoxic-appearing Eyes: PERRL, lids and conjunctivae normal ENMT: Mucous membranes are dry, Posterior pharynx clear of any exudate or lesions.Normal dentition.  Neck: normal, supple, no masses, no thyromegaly Respiratory: clear to auscultation bilaterally somewhat decreased in bases, no wheezing, no crackles. Normal respiratory effort. No accessory muscle use. RA Cardiovascular: Regular rate and rhythm, no murmurs / rubs / gallops. No extremity edema. 2+ pedal pulses. No carotid bruits. Adequate capillary refill although feet somewhat slightly cooler to touch. Abdomen: no tenderness, no masses palpated. No appreciable hepatosplenomegaly difficult to adequately assess given the degree of abdominal obesity. Bowel sounds positive. Obese and soft Musculoskeletal: no clubbing / cyanosis. No joint deformity upper and lower extremities. Good ROM, no contractures. Normal muscle tone.  Skin: no rashes, lesions, ulcers. No induration Neurologic: CN 2-12 are to be grossly intact although testing limited given patient's altered mentation. Sensation intact, DTR at least diminished normal. Strength pierced to be 5/5 x all 4 extremities stone observation of patient's spontaneous movement noting he is unable to participate adequately due to altered mentation.  Psychiatric: Patient is lethargic but arousable and opens eyes but is confused otherwise   Labs on Admission: I have personally reviewed following labs and imaging studies  CBC:  Recent Labs Lab 12/14/15 0303  WBC 17.5*  NEUTROABS 14.8*  HGB 11.3*  HCT 33.9*  MCV 94.2  PLT 71*   Basic Metabolic Panel:  Recent Labs Lab 12/14/15 0303  NA 131*  K 5.4*  CL  105  CO2 19*  GLUCOSE 117*  BUN 37*  CREATININE 1.85*  CALCIUM 8.7*   GFR: CrCl cannot be calculated (Unknown ideal weight.). Liver Function Tests:  Recent Labs Lab 12/14/15 0303  AST 47*  ALT 27  ALKPHOS 119  BILITOT 2.9*  PROT 5.6*  ALBUMIN 2.7*   No results for input(s): LIPASE, AMYLASE in the last 168 hours.  Recent Labs Lab 12/14/15 0303  AMMONIA 91*   Coagulation Profile:  Recent Labs Lab 12/14/15 0303  INR 1.81*   Cardiac Enzymes: No results for input(s): CKTOTAL, CKMB, CKMBINDEX, TROPONINI in the  last 168 hours. BNP (last 3 results) No results for input(s): PROBNP in the last 8760 hours. HbA1C: No results for input(s): HGBA1C in the last 72 hours. CBG: No results for input(s): GLUCAP in the last 168 hours. Lipid Profile: No results for input(s): CHOL, HDL, LDLCALC, TRIG, CHOLHDL, LDLDIRECT in the last 72 hours. Thyroid Function Tests: No results for input(s): TSH, T4TOTAL, FREET4, T3FREE, THYROIDAB in the last 72 hours. Anemia Panel: No results for input(s): VITAMINB12, FOLATE, FERRITIN, TIBC, IRON, RETICCTPCT in the last 72 hours. Urine analysis:    Component Value Date/Time   COLORURINE AMBER* 12/14/2015 0420   APPEARANCEUR CLOUDY* 12/14/2015 0420   LABSPEC 1.025 12/14/2015 0420   PHURINE 5.0 12/14/2015 0420   GLUCOSEU NEGATIVE 12/14/2015 0420   HGBUR NEGATIVE 12/14/2015 0420   BILIRUBINUR NEGATIVE 12/14/2015 0420   KETONESUR 15* 12/14/2015 0420   PROTEINUR NEGATIVE 12/14/2015 0420   UROBILINOGEN 1.0 02/23/2014 1710   NITRITE NEGATIVE 12/14/2015 0420   LEUKOCYTESUR NEGATIVE 12/14/2015 0420   Sepsis Labs: @LABRCNTIP (procalcitonin:4,lacticidven:4) )No results found for this or any previous visit (from the past 240 hour(s)).   Radiological Exams on Admission: Ct Head Wo Contrast  12/14/2015  CLINICAL DATA:  Worsening confusion. Recurrent RIGHT pleural effusion. History of hypertension diabetes, chronic kidney disease, cirrhosis. EXAM: CT  HEAD WITHOUT CONTRAST TECHNIQUE: Contiguous axial images were obtained from the base of the skull through the vertex without intravenous contrast. COMPARISON:  CT HEAD November 23, 2015 FINDINGS: Mildly motion degraded examination. INTRACRANIAL CONTENTS: The ventricles and sulci are normal for age. No intraparenchymal hemorrhage, mass effect nor midline shift. No acute large vascular territory infarcts. No abnormal extra-axial fluid collections. Basal cisterns are patent. Minimal calcific atherosclerosis of the carotid siphons. ORBITS: The included ocular globes and orbital contents are non-suspicious. SINUSES: The mastoid aircells and included paranasal sinuses are well-aerated. SKULL/SOFT TISSUES: No skull fracture. No significant soft tissue swelling. IMPRESSION: Negative mildly motion degraded CT HEAD Electronically Signed   By: Elon Alas M.D.   On: 12/14/2015 04:02   Dg Chest Portable 1 View  12/14/2015  CLINICAL DATA:  Gradually worsening altered mental status starting yesterday. History of recurrent right-sided pleural effusions. EXAM: PORTABLE CHEST 1 VIEW COMPARISON:  11/23/2015 FINDINGS: Shallow inspiration. Large right pleural effusion for cyst. Atelectasis or infiltration in the right lung. Appearance is similar to previous study. Heart size is mildly enlarged. No pneumothorax. IMPRESSION: Persistent right pleural effusion with infiltration or atelectasis in the right lung. No significant improvement since previous study. Electronically Signed   By: Lucienne Capers M.D.   On: 12/14/2015 04:07    EKG: (Independently reviewed) sinus rhythm with ventricular rate 88 bpm, QTC 448 ms, nonischemic appearing EKG and stable with previous EKG  Assessment/Plan Principal Problem:   Acute hepatic encephalopathy/NASH cirrhosis -Complex patient who presents with acute encephalopathic symptoms typical to previous admissions with elevated ammonia level despite adherence to medications at home -Patient  presents with concomitant elevated lactic acid and leukocytosis without source of infection that could be contributing to patient's encephalopathy as well -Hold rifaximin due to altered mental status -Lactulose enema twice a day -Follow ammonia level -Suspect degree of acute kidney injury with elevated BUN contributing -Followed by [facility] transplant team although officially not on transplant list; patient's wife states they are to have a multidisciplinary team meeting today to determine if patient can transition to transplant list due to increased frequency of hospitalizations secondary to recurrent encephalopathy and persistent right pleural effusion -Patient has stable thrombocytopenia platelets less than  100,000 greater than 50,000 -Has mild coagulopathy with PT 20.9 and INR 1.81 with normal PTT  Active Problems:   AKI (acute kidney injury)  /CKD (chronic kidney disease), stage III -Suspect related to dehydration which is multifactorial: High-volume stool output related to necessary lactulose in setting of concomitant use of Lasix and spironolactone for known hepatic ascites without peripheral edema -Hold Lasix and Aldactone especially with borderline hyperkalemia -Patient without peripheral edema and no significant ascites based on physical exam today -With repeated hospitalizations with similar presentation and documented high output i.e. high volume loss stools with lactulose may need to consider utilizing diuretics on a prn basis in the future (based on weight gain as opposed to daily usage)    Dehydration -Appears to be recurrent-see above -Time-limited IV fluids at 75 mL per hour for 18 hours only    SIRS (systemic inflammatory response syndrome) -Patient presents with altered mentation likely explained by elevated ammonia level, elevated white count greater than 12,000, at rate greater than 90, respirations greater than 20, PCO2 less than 32; although he does have acute kidney injury  this is very mild and just slightly above his baseline so do not feel he meets criteria for sepsis -Serum lactate was greater than 2 but is trending downward with hydration -Procalcitonin mildly elevated at 0.72 but is persistently elevated and likely related to patient's cirrhosis -No clinical findings of SBP or other source of infection but given abnormal chest x-ray (see below) could have infectious or aspiration component especially with recent hospitalization we'll continue empiric vancomycin and Zosyn given in ER -Follow up on blood cultures -Urinalysis not consistent with UTI **An ABG was obtained that demonstrated normal pH with an elevated base excess but this was also inadvertently mixed gas with respiratory suspecting they not only obtained an arterial sample but also aspirated venous blood as well as when sample was obtained therefore the spurious low PO2 reading    Essential hypertension, benign -Current blood pressure somewhat suboptimal so hold Toprol-XL and diuretics as above    Chronic diastolic congestive heart failure, NYHA class 1 -Appears to be compensated -Do not suspect effusion is reflective of decompensated heart failure (see below) -Diuretics on hold -Do not have current weight but weight at discharge was 300 pounds and since December 2016 weight has decreased from a peak of 370 pounds    Recurrent right pleural effusion -Suspect etiology related to shunting backflow from cirrhosis and not from heart failure -Has undergone thoracentesis in the past but continues to reaccumulate which is not unexpected given patient's cirrhosis -Typically these type of effusions are not responsive to diuretic therapies -May benefit from a shunting procedure internally (porto hepatic systemic shunt) versus TIPS procedure to minimize recurrence-shunting procedure likely would need to be done at tertiary facility such as  -Patient's wife states that [facility] transplant team adamantly stated  patient should never undergo Pleurx tube placement -Cannot exclude concomitant aspiration or infectious process at this point -Ultrasound guided thoracentesis this admission    OSA (obstructive sleep apnea)/Morbid obesity  -Nocturnal CPAP -Based on previous discharge weight patient has lost 70 pounds since December of last year    Anemia, chronic disease -Baseline hemoglobin appears to be around 10.6 when euvolemic  -Current hemoglobin 11.3 which may indicate volume depletion      DVT prophylaxis: SCDs Code Status: Full Code Family Communication: Wife at bedside Disposition Plan: Anticipate discharge back to home environment once medically stable Consults called: None  Admission status: Down/inpatient  Rigby Leonhardt L. ANP-BC [facility] Pager [phone]   If 7PM-7AM, please contact night-coverage [email or web address] Password [facility]  12/14/2015, 9:29 AM

## 2015-12-14 NOTE — Procedures (Signed)
Ultrasound-guided diagnostic and therapeutic right thoracentesis performed yielding 1.7 liters of slightly cloudy yellow colored fluid. No immediate complications, but procedure terminated due to low BP of 75/39. Follow-up chest x-ray pending.       Daisy Lites E 11:20 AM 12/14/2015

## 2015-12-14 NOTE — Progress Notes (Signed)
   12/14/15 1400  Clinical Encounter Type  Visited With Patient  Visit Type Initial  Spiritual Encounters  Spiritual Needs Emotional  Stress Factors  Patient Stress Factors Exhausted;Health changes  Family Stress Factors None identified   Chaplain doing rounds on floor and stopped in to visit with patient. Patient requested something to drink, if possible.  Chaplain offered ministry of presence to patient.  Patient shared that he attends [employer] and pastor has been able to visit with him.  Vilinda Blanks Nichalos Brenton 12/14/2015 2:15 PM

## 2015-12-14 NOTE — ED Notes (Signed)
Pt arrived via [facility] c/o [facility].  Wife states she was instructed by primary care doctor to bring pt in when his confusion increases.  States had to call ambulance b/c he forgets how to stand.  Pt is candidate for transplant.  Non alcoholic cirrhosis.

## 2015-12-14 NOTE — ED Provider Notes (Signed)
TIME SEEN: 3:15 AM  CHIEF COMPLAINT: Altered mental status   Level V Caveat due to AMS   HPI: Pt is a 65 y.o. male with PMHx of DM, HTN, CHF, CKD, cirrhosis 2/2 NASH, and recurrent right-sided pleural effusion, who presents to the ED complaining of gradually worsening, altered mental status that started yesterday. Progressively worsen throughout the day. Has had multiple admissions for hepatic and cephalopathy and wife reports this seems similar. Per wife, pt usually becomes very confused during these episodes; she notes these episodes occur about once a month in which he has to visit the ED. Per wife, pt has been sluggish all day. She states pt took lactulose earlier today; she notes she gave him an extra dose this evening when she noticed his sluggishness. Pt's wife reports pt has not missed a single dose. He has been having multiple loose bowel movements every day. Per wife, pt became more confused after eating a small dinner. She reports pt would not get up from his chair when prompted to go to bed; she states this is similar to how he has acted in the past due to episodes of confusion from hepatic encephalopathy. States it was like he "did not know how to stand".  Pt's wife denies fever, coughing, and vomiting. She notes pt fell and hit his head ~1 month ago but denies any other recent head injury. Pt's wife reports pt is currently waiting to see if he will be placed on the list for a liver transplant. Followed at [facility] currently. Per wife, pt is not taking any anticoagulants. Denies any other changes in medications. He is also on rifaximin. No drug or alcohol use. She notes pt's PCP is Dr. Moreen Fowler at [facility].   ROS: Unable to perform due to AMS  PAST MEDICAL HISTORY/PAST SURGICAL HISTORY:  Past Medical History  Diagnosis Date  . Splenomegaly     slt thrombocytopenia;no complics. egd [id number] neg for varices, ?mild portal gastropathy; 02/2011 nl,novarices  . 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 Hypertension   . Adenomatous colon polyp '01 & '08  . Hemorrhoids     Severe anorectal pain and anal fissure w bleeding following hemorrhoidectomy may 2010  . Family history of colon cancer     mother --18  . Seasonal allergies   . Screening     Hepatocellular Screening CT neg 05/2008, U/S neg 04/2009,05/2010  . Dysphagia     BaS/tab neg 8/09; egd's neg for ring/strict--?dysmotility  . Chronic diastolic congestive heart failure ([place])     a.  ECHO 01/13/14: EF 55-60%; moderate LVH, G2DD, biatrial enlargement; mild RVE; moderate MR; mildly elevated, pulm pressures.  . Morbid obesity ([place])   . Frequent PVCs   . Pancytopenia ([place])   . Cirrhosis ([place])     a. Due to NASH, followed by [facility].  Marland Kitchen Shortness of breath   . Arthritis     RA  " ALL OVER "  . Dilatation of aorta ([facility])     a. Mild aortic root dilitation by echo 01/13/14  . Chronic hepatitis ([place])     cirrhosis  . Hepatic encephalopathy ([place])   . CKD (chronic kidney disease)   . Pleural effusion 08/2015  . NASH (nonalcoholic steatohepatitis) dx'd 2007  . OSA on CPAP     MEDICATIONS:  Prior to Admission medications   Medication Sig Start Date End Date Taking? Authorizing Provider  cholecalciferol (VITAMIN D) 1000 UNITS  tablet Take 2,000 Units by mouth every morning.     Historical Provider, MD  citalopram (CELEXA) 10 MG tablet Take 1 tablet by mouth daily. 09/30/15 09/29/16  Historical Provider, MD  esomeprazole (NEXIUM) 40 MG capsule Take 40 mg by mouth every evening.     Historical Provider, MD  furosemide (LASIX) 40 MG tablet Take 1 tablet (40 mg total) by mouth daily. 11/27/15   Barton Dubois, MD  hydrOXYzine (ATARAX/VISTARIL) 25 MG tablet Take 0.5 tablets (12.5 mg total) by mouth at bedtime as needed for anxiety (insomnia). 11/27/15   Barton Dubois, MD  lactulose (CHRONULAC) 10 GM/15ML solution Take 45 mLs (30 g total) by mouth 3 (three) times daily. Please increase up to 40g three  times a day if not achieving 4-5 BM daily or patient mentation is worsening. 11/27/15   Barton Dubois, MD  magnesium oxide (MAGOX 400) 400 (241.3 Mg) MG tablet Take 1 tablet (400 mg total) by mouth 2 (two) times daily. 11/27/15   Barton Dubois, MD  metoprolol succinate (TOPROL-XL) 25 MG 24 hr tablet Take 1 tablet by mouth at bedtime. 10/27/15 10/26/16  Historical Provider, MD  Multiple Vitamin (MULTIVITAMIN) tablet Take 1 tablet by mouth every morning.     Historical Provider, MD  rifaximin (XIFAXAN) 550 MG TABS tablet Take 1 tablet (550 mg total) by mouth 2 (two) times daily. 02/13/14   Geradine Girt, DO  simethicone (MYLICON) 80 MG chewable tablet Chew 1 tablet (80 mg total) by mouth 2 (two) times daily. Patient taking differently: Chew 160 mg by mouth 4 (four) times daily as needed for flatulence.  07/03/15   Modena Jansky, MD  spironolactone (ALDACTONE) 50 MG tablet Take 1 tablet (50 mg total) by mouth daily. 11/21/15   Kelvin Cellar, MD  zinc sulfate 220 MG capsule Take 220 mg by mouth daily. 08/17/15 08/16/16  Historical Provider, MD    ALLERGIES:  No Known Allergies  SOCIAL HISTORY:  Social History  Substance Use Topics  . Smoking status: Never Smoker   . Smokeless tobacco: Never Used  . Alcohol Use: Yes     Comment: 11/23/2015  "none since 2007's dx fatty liver disease"    FAMILY HISTORY: Family History  Problem Relation Age of Onset  . Cancer Mother     died of breast cancer  . Liver disease Father   . Thyroid disease Sister     EXAM: BP 153/109 mmHg  Pulse 91  Temp(Src) 99.6 F (37.6 C) (Rectal)  Resp 21  SpO2 99% CONSTITUTIONAL: Chronically ill-appearing, very drowsy, arouses to voice and is able to answer his name but does not answer any other questions correctly or follow commands HEAD: Normocephalic, atraumatic EYES: Conjunctivae clear, PERRL ENT: normal nose; no rhinorrhea; dry mucous membranes NECK: Supple, no meningismus, no LAD  CARD: RRR; S1 and S2  appreciated; no murmurs, no clicks, no rubs, no gallops RESP: Normal chest excursion without splinting or tachypnea; breath sounds decreased on the right side; normal breath sounds on the left side, no wheezes, no rhonchi, no rales, no hypoxia or respiratory distress, speaking full sentences ABD/GI: Normal bowel sounds; non-distended; soft, non-tender, no rebound, no guarding, no peritoneal signs BACK:  The back appears normal and is non-tender to palpation, there is no CVA tenderness EXT: Normal ROM in all joints; non-tender to palpation; no edema; normal capillary refill; no cyanosis, no calf tenderness or swelling    SKIN: Normal color for age and race; warm; no rash NEURO: Moves all extremities  equally, has slurred speech, no obvious facial droop, does not follow commands or answer questions appropriately, no asterixis  MEDICAL DECISION MAKING: Patient here with altered mental status. Has been admitted several times in the past for hepatic encephalopathy. Wife feels that this is similar. Was recently admitted June 13 to June 18 and then again June 20 through June 24. During last admission was found to have a urinary tract infection. Not currently on antibiotics. Wife has been giving him his rifaximin and lactulose regularly and states he is having multiple loose stools a day. Will check labs, urine, chest x-ray, head CT. Anticipate admission.  ED PROGRESS: Patient's labs showed a leukocytosis with left shift. He has chronic thrombocytopenia. He has mild elevated creatinine at 1.85 which appears to be stable. Urine shows no sign of infection. Lactate is mildly elevated. Ammonia is 91. Chest x-ray shows persistent right-sided pleural effusion with infiltrate versus atelectasis. Head CT shows no acute abnormality. Given new elevation in his white blood cell count and lactate, will treat with antibiotic for possible healthcare associated pneumonia. We'll give rectal lactulose. We'll discuss with medicine  for admission.   5:15 AM  Discussed patient's case with hospitalist, Dr. Blaine Hamper.  Recommend admission to inpatient, telemetry bed.  I will place holding orders per their request. Patient and family (if present) updated with plan. Care transferred to hospitalist service.  I reviewed all nursing notes, vitals, pertinent old records, EKGs, labs, imaging (as available).      EKG Interpretation  Date/Time:  Tuesday December 14 2015 04:05:13 EDT Ventricular Rate:  88 PR Interval:    QRS Duration: 96 QT Interval:  370 QTC Calculation: 448 R Axis:     Text Interpretation:  Sinus rhythm Borderline prolonged PR interval Low voltage, precordial leads Anteroseptal infarct, age indeterminate No significant change since last tracing Confirmed by Pharoah Goggins,  DO, Rebakah Cokley ([id number]) on 12/14/2015 4:23:08 AM        I personally performed the services described in this documentation, which was scribed in my presence. The recorded information has been reviewed and is accurate.    [place], DO 12/14/15 [phone]

## 2015-12-14 NOTE — Progress Notes (Signed)
ABG Results reported to Renato Shin, MD @ 820 by Maxie Barb RRT RCP.

## 2015-12-15 DIAGNOSIS — B957 Other staphylococcus as the cause of diseases classified elsewhere: Secondary | ICD-10-CM | POA: Diagnosis present

## 2015-12-15 DIAGNOSIS — D696 Thrombocytopenia, unspecified: Secondary | ICD-10-CM | POA: Insufficient documentation

## 2015-12-15 DIAGNOSIS — R7881 Bacteremia: Secondary | ICD-10-CM | POA: Diagnosis present

## 2015-12-15 LAB — BLOOD CULTURE ID PANEL (REFLEXED)
Acinetobacter baumannii: NOT DETECTED
CANDIDA ALBICANS: NOT DETECTED
CANDIDA KRUSEI: NOT DETECTED
CANDIDA PARAPSILOSIS: NOT DETECTED
CANDIDA TROPICALIS: NOT DETECTED
Candida glabrata: NOT DETECTED
Carbapenem resistance: NOT DETECTED
ENTEROBACTER CLOACAE COMPLEX: NOT DETECTED
ENTEROCOCCUS SPECIES: NOT DETECTED
Enterobacteriaceae species: NOT DETECTED
Escherichia coli: NOT DETECTED
Haemophilus influenzae: NOT DETECTED
KLEBSIELLA OXYTOCA: NOT DETECTED
KLEBSIELLA PNEUMONIAE: NOT DETECTED
Listeria monocytogenes: NOT DETECTED
Methicillin resistance: DETECTED — AB
Neisseria meningitidis: NOT DETECTED
PROTEUS SPECIES: NOT DETECTED
PSEUDOMONAS AERUGINOSA: NOT DETECTED
STAPHYLOCOCCUS AUREUS BCID: NOT DETECTED
STREPTOCOCCUS AGALACTIAE: NOT DETECTED
STREPTOCOCCUS PNEUMONIAE: NOT DETECTED
Serratia marcescens: NOT DETECTED
Staphylococcus species: DETECTED — AB
Streptococcus pyogenes: NOT DETECTED
Streptococcus species: NOT DETECTED
VANCOMYCIN RESISTANCE: NOT DETECTED

## 2015-12-15 LAB — APTT: aPTT: 41 seconds — ABNORMAL HIGH (ref 24–37)

## 2015-12-15 LAB — CBC
HEMATOCRIT: 26.5 % — AB (ref 39.0–52.0)
HEMATOCRIT: 27.8 % — AB (ref 39.0–52.0)
HEMOGLOBIN: 8.9 g/dL — AB (ref 13.0–17.0)
HEMOGLOBIN: 9.2 g/dL — AB (ref 13.0–17.0)
MCH: 31.8 pg (ref 26.0–34.0)
MCH: 31.6 pg (ref 26.0–34.0)
MCHC: 33.6 g/dL (ref 30.0–36.0)
MCHC: 33.1 g/dL (ref 30.0–36.0)
MCV: 94.6 fL (ref 78.0–100.0)
MCV: 95.5 fL (ref 78.0–100.0)
PLATELETS: 48 10*3/uL — AB (ref 150–400)
Platelets: 63 10*3/uL — ABNORMAL LOW (ref 150–400)
RBC: 2.8 MIL/uL — ABNORMAL LOW (ref 4.22–5.81)
RBC: 2.91 MIL/uL — ABNORMAL LOW (ref 4.22–5.81)
RDW: 17.2 % — ABNORMAL HIGH (ref 11.5–15.5)
RDW: 17.1 % — ABNORMAL HIGH (ref 11.5–15.5)
WBC: 6.1 10*3/uL (ref 4.0–10.5)
WBC: 6.4 10*3/uL (ref 4.0–10.5)

## 2015-12-15 LAB — COMPREHENSIVE METABOLIC PANEL
ALT: 21 U/L (ref 17–63)
ANION GAP: 5 (ref 5–15)
AST: 33 U/L (ref 15–41)
Albumin: 2.6 g/dL — ABNORMAL LOW (ref 3.5–5.0)
Alkaline Phosphatase: 81 U/L (ref 38–126)
BILIRUBIN TOTAL: 3.1 mg/dL — AB (ref 0.3–1.2)
BUN: 39 mg/dL — ABNORMAL HIGH (ref 6–20)
CO2: 21 mmol/L — ABNORMAL LOW (ref 22–32)
Calcium: 8.5 mg/dL — ABNORMAL LOW (ref 8.9–10.3)
Chloride: 108 mmol/L (ref 101–111)
Creatinine, Ser: 1.92 mg/dL — ABNORMAL HIGH (ref 0.61–1.24)
GFR, EST AFRICAN AMERICAN: 41 mL/min — AB (ref >60)
GFR, EST NON AFRICAN AMERICAN: 35 mL/min — AB (ref >60)
Glucose, Bld: 83 mg/dL (ref 65–99)
POTASSIUM: 4.9 mmol/L (ref 3.5–5.1)
Sodium: 134 mmol/L — ABNORMAL LOW (ref 135–145)
TOTAL PROTEIN: 5 g/dL — AB (ref 6.5–8.1)

## 2015-12-15 LAB — MISC LABCORP TEST (SEND OUT): Labcorp test code: 88062

## 2015-12-15 LAB — BASIC METABOLIC PANEL
Anion gap: 7 (ref 5–15)
BUN: 40 mg/dL — AB (ref 6–20)
CHLORIDE: 108 mmol/L (ref 101–111)
CO2: 18 mmol/L — AB (ref 22–32)
CREATININE: 1.89 mg/dL — AB (ref 0.61–1.24)
Calcium: 8.4 mg/dL — ABNORMAL LOW (ref 8.9–10.3)
GFR calc non Af Amer: 36 mL/min — ABNORMAL LOW (ref >60)
GFR, EST AFRICAN AMERICAN: 42 mL/min — AB (ref >60)
GLUCOSE: 101 mg/dL — AB (ref 65–99)
Potassium: 4.9 mmol/L (ref 3.5–5.1)
Sodium: 133 mmol/L — ABNORMAL LOW (ref 135–145)

## 2015-12-15 LAB — AMMONIA: AMMONIA: 31 umol/L (ref 9–35)

## 2015-12-15 LAB — PH, BODY FLUID: PH, BODY FLUID: 7.9

## 2015-12-15 LAB — PROTIME-INR
INR: 2.06 — AB (ref 0.00–1.49)
PROTHROMBIN TIME: 23.1 s — AB (ref 11.6–15.2)

## 2015-12-15 LAB — LACTIC ACID, PLASMA: LACTIC ACID, VENOUS: 1.4 mmol/L (ref 0.5–1.9)

## 2015-12-15 LAB — TRIGLYCERIDES, BODY FLUIDS: Triglycerides, Fluid: 57 mg/dL

## 2015-12-15 MED ORDER — CITALOPRAM HYDROBROMIDE 20 MG PO TABS
10.0000 mg | ORAL_TABLET | Freq: Every day | ORAL | Status: DC
  Administered 2015-12-15 – 2015-12-16 (×2): 10 mg via ORAL
  Filled 2015-12-15 (×2): qty 1

## 2015-12-15 MED ORDER — HYDROXYZINE HCL 25 MG PO TABS
25.0000 mg | ORAL_TABLET | Freq: Three times a day (TID) | ORAL | Status: DC | PRN
  Administered 2015-12-15 – 2015-12-16 (×2): 25 mg via ORAL
  Filled 2015-12-15 (×2): qty 1

## 2015-12-15 MED ORDER — ADULT MULTIVITAMIN W/MINERALS CH
1.0000 | ORAL_TABLET | Freq: Every day | ORAL | Status: DC
  Administered 2015-12-15 – 2015-12-16 (×2): 1 via ORAL
  Filled 2015-12-15 (×2): qty 1

## 2015-12-15 MED ORDER — MAGNESIUM OXIDE 400 (241.3 MG) MG PO TABS
400.0000 mg | ORAL_TABLET | Freq: Two times a day (BID) | ORAL | Status: DC
  Administered 2015-12-15 – 2015-12-16 (×3): 400 mg via ORAL
  Filled 2015-12-15 (×3): qty 1

## 2015-12-15 MED ORDER — VITAMIN D 1000 UNITS PO TABS
2000.0000 [IU] | ORAL_TABLET | Freq: Every morning | ORAL | Status: DC
  Administered 2015-12-15 – 2015-12-16 (×2): 2000 [IU] via ORAL
  Filled 2015-12-15 (×2): qty 2

## 2015-12-15 MED ORDER — HYDROXYZINE HCL 25 MG PO TABS: 25.0000 mg | ORAL_TABLET | Freq: Three times a day (TID) | ORAL | Status: DC | PRN

## 2015-12-15 MED ORDER — ONE-DAILY MULTI VITAMINS PO TABS: 1.0000 | ORAL_TABLET | Freq: Every morning | ORAL | Status: DC

## 2015-12-15 MED ORDER — SPIRONOLACTONE 25 MG PO TABS
100.0000 mg | ORAL_TABLET | Freq: Every day | ORAL | Status: DC
  Administered 2015-12-15 – 2015-12-16 (×2): 100 mg via ORAL
  Filled 2015-12-15 (×2): qty 4

## 2015-12-15 MED ORDER — PANTOPRAZOLE SODIUM 40 MG PO TBEC
80.0000 mg | DELAYED_RELEASE_TABLET | Freq: Every day | ORAL | Status: DC
  Administered 2015-12-15: 80 mg via ORAL
  Filled 2015-12-15: qty 2

## 2015-12-15 MED ORDER — LACTULOSE 10 GM/15ML PO SOLN
30.0000 g | Freq: Three times a day (TID) | ORAL | Status: DC
  Administered 2015-12-15 – 2015-12-16 (×4): 30 g via ORAL
  Filled 2015-12-15 (×4): qty 45

## 2015-12-15 MED ORDER — FUROSEMIDE 40 MG PO TABS
40.0000 mg | ORAL_TABLET | Freq: Every day | ORAL | Status: DC
  Administered 2015-12-15 – 2015-12-16 (×2): 40 mg via ORAL
  Filled 2015-12-15 (×2): qty 1

## 2015-12-15 MED ORDER — SIMETHICONE 80 MG PO CHEW: 160.0000 mg | CHEWABLE_TABLET | Freq: Four times a day (QID) | ORAL | Status: DC | PRN

## 2015-12-15 MED ORDER — RIFAXIMIN 550 MG PO TABS
550.0000 mg | ORAL_TABLET | Freq: Two times a day (BID) | ORAL | Status: DC
  Administered 2015-12-15 – 2015-12-16 (×3): 550 mg via ORAL
  Filled 2015-12-15 (×4): qty 1

## 2015-12-15 MED ORDER — HYDROCORTISONE 1 % EX CREA
TOPICAL_CREAM | Freq: Four times a day (QID) | CUTANEOUS | Status: DC | PRN
  Administered 2015-12-16: 13:00:00 via TOPICAL
  Filled 2015-12-15: qty 28

## 2015-12-15 MED ORDER — WITCH HAZEL-GLYCERIN EX PADS
MEDICATED_PAD | CUTANEOUS | Status: DC | PRN
  Administered 2015-12-16: 1 via TOPICAL
  Filled 2015-12-15: qty 100

## 2015-12-15 NOTE — Progress Notes (Signed)
[place] TEAM 1 - Stepdown/ICU TEAM  Peter Larson  [id number] DOB: [birth year] DOA: 12/14/2015 PCP: [name], MD    Brief Narrative:  65 y.o. male with a history of NASH cirrhosis and multiple recurrent hospitalizations in the past 6-8 months for acute hepatic encephalopathy, obesity, sleep apnea, CKD stage III, chronic diastolic congestive heart failure grade 1, and HTN who presented to the hospital with less than 24-48 hours of acute altered mentation c/w his usual hepatic encephalopathy. His wife is well versed on titrating his medical tx, and gave him an extra dose of lactulose without improvement.  He was strictly compliant w/ all of his prior scheduled doses.    In the ED he was found to have an ammonia level of 91.  He was tx w/ a lactulose enema, and a diagnostic paracentesis was performed.        Subjective: Mental status has rapidly returned to normal s/p lactulose enema, w/ normalization of his ammonia.  He denies cp, sob, n/v, or abdom pain. He has required I/O cath x2 since admit due to urinary retention.    I had a lengthy discussion w/ he and his wife regarding his care at [facility], and his ongoing transplant evaluation.  He has important clinic visits scheduled on 12/16/15 at [facility] to continue this process.    Assessment & Plan:  Acute hepatic encephalopathy / NASH cirrhosis -elevated ammonia level despite adherence to medications at home -Followed by [facility] Transplant Team - currently undergoing w/u to be place on transplant list w/ next appointment 7/13  2/2 blood cx + Meth resistant Coag neg Staph   AKI / CKD stage III -baseline crt appears to be 1.4-1.5 -resume diuretics today and follow   Urinary retention -thought to be due to being in bed - ambulate - follow - if recurs will have to consider foley cath in setting of AKI  Essential hypertension, benign -Current blood pressure somewhat suboptimal so hold Toprol-XL and diuretics as above  Chronic diastolic  congestive heart failure, NYHA class 1 -compensated - weight 6/20 was 136.5kg Filed Weights   12/15/15 1000  Weight: 135.172 kg (298 lb)    Chronic right pleural effusion / hydrothorax -has undergone thoracentesis in the past but continues to reaccumulate which is not unexpected -not clinically significant at this time   OSA -Nocturnal CPAP  Anemia, chronic disease -Baseline hemoglobin appears to be ~9.5-10.6 - no gross evidence of blood loss - suspect "drop" since admit due to hydration - follow trend   Recent Labs Lab 12/14/15 0303 12/15/15 0349  HGB 11.3* 8.9*    DVT prophylaxis: SCDs Code Status: FULL CODE Family Communication: spoke w/ wife at bedside at length  Disposition Plan: awaiting ID eval - possible d/c home for clinic f/u at [facility] tomorrow, v/s attempt to facilitate direct transfer to [facility]   Consultants:  ID  Procedures: 7/11 paracentesis - 1.7L  Antimicrobials:  Zosyn 7/11 > Vanc 7/11 >  Objective: Blood pressure 130/58, pulse 61, temperature 98 F (36.7 C), temperature source Oral, resp. rate 20, weight 135.172 kg (298 lb), SpO2 97 %.  Intake/Output Summary (Last 24 hours) at 12/15/15 1223 Last data filed at 12/15/15 0953  Gross per 24 hour  Intake   1513 ml  Output    800 ml  Net    713 ml   Filed Weights   12/15/15 1000  Weight: 135.172 kg (298 lb)    Examination: General: No acute respiratory distress - alert and conversant  Lungs: poor air movement in R base - clear th/o other fields - no wheeze  Cardiovascular: Regular rate and rhythm without murmur gallop or rub - distant HS  Abdomen: Nontender, protuberant, soft, bowel sounds positive, no rebound, no appreciable mass Extremities: No significant cyanosis, clubbing, or edema bilateral lower extremities  CBC:  Recent Labs Lab 12/14/15 0303 12/15/15 0349  WBC 17.5* 6.1  NEUTROABS 14.8*  --   HGB 11.3* 8.9*  HCT 33.9* 26.5*  MCV 94.2 94.6  PLT 71* 48*   Basic Metabolic  Panel:  Recent Labs Lab 12/14/15 0303 12/14/15 1228 12/15/15 0349  NA 131*  --  134*  K 5.4*  --  4.9  CL 105  --  108  CO2 19*  --  21*  GLUCOSE 117*  --  83  BUN 37*  --  39*  CREATININE 1.85*  --  1.92*  CALCIUM 8.7*  --  8.5*  MG  --  2.4  --   PHOS  --  3.7  --    GFR: Estimated Creatinine Clearance: 56.8 mL/min (by C-G formula based on Cr of 1.92).  Liver Function Tests:  Recent Labs Lab 12/14/15 0303 12/15/15 0349  AST 47* 33  ALT 27 21  ALKPHOS 119 81  BILITOT 2.9* 3.1*  PROT 5.6* 5.0*  ALBUMIN 2.7* 2.6*    Recent Labs Lab 12/14/15 0303 12/15/15 0349  AMMONIA 91* 31    Coagulation Profile:  Recent Labs Lab 12/14/15 0303 12/15/15 0349  INR 1.81* 2.06*    HbA1C: HGB A1C MFR BLD  Date/Time Value Ref Range Status  08/31/2015 08:17 AM 5.1 4.8 - 5.6 % Final    Comment:    (NOTE)         Pre-diabetes: 5.7 - 6.4         Diabetes: >6.4         Glycemic control for adults with diabetes: <7.0      Recent Results (from the past 240 hour(s))  Culture, blood (x 2)     Status: Abnormal (Preliminary result)   Collection Time: 12/14/15  5:30 AM  Result Value Ref Range Status   Specimen Description BLOOD RIGHT ARM  Final   Special Requests BOTTLES DRAWN AEROBIC AND ANAEROBIC 10ML  Final   Culture  Setup Time   Final    GRAM POSITIVE COCCI IN CLUSTERS AEROBIC BOTTLE ONLY CRITICAL RESULT CALLED TO, READ BACK BY AND VERIFIED WITH: CARON AMEND,PHARMD @0106  12/15/15 MKELLY    Culture STAPHYLOCOCCUS SPECIES (COAGULASE NEGATIVE) (A)  Final   Report Status PENDING  Incomplete  Blood Culture ID Panel (Reflexed)     Status: Abnormal   Collection Time: 12/14/15  5:30 AM  Result Value Ref Range Status   Enterococcus species NOT DETECTED NOT DETECTED Final   Vancomycin resistance NOT DETECTED NOT DETECTED Final   Listeria monocytogenes NOT DETECTED NOT DETECTED Final   Staphylococcus species DETECTED (A) NOT DETECTED Final    Comment: CRITICAL RESULT  CALLED TO, READ BACK BY AND VERIFIED WITH: CARON AMEND,PHARMD @0106  12/15/15 MKELLY    Staphylococcus aureus NOT DETECTED NOT DETECTED Final   Methicillin resistance DETECTED (A) NOT DETECTED Final    Comment: CRITICAL RESULT CALLED TO, READ BACK BY AND VERIFIED WITH: CARON AMEND,PHARMD @0106  12/15/15    Streptococcus species NOT DETECTED NOT DETECTED Final   Streptococcus agalactiae NOT DETECTED NOT DETECTED Final   Streptococcus pneumoniae NOT DETECTED NOT DETECTED Final   Streptococcus pyogenes NOT DETECTED NOT DETECTED Final  Acinetobacter baumannii NOT DETECTED NOT DETECTED Final   Enterobacteriaceae species NOT DETECTED NOT DETECTED Final   Enterobacter cloacae complex NOT DETECTED NOT DETECTED Final   Escherichia coli NOT DETECTED NOT DETECTED Final   Klebsiella oxytoca NOT DETECTED NOT DETECTED Final   Klebsiella pneumoniae NOT DETECTED NOT DETECTED Final   Proteus species NOT DETECTED NOT DETECTED Final   Serratia marcescens NOT DETECTED NOT DETECTED Final   Carbapenem resistance NOT DETECTED NOT DETECTED Final   Haemophilus influenzae NOT DETECTED NOT DETECTED Final   Neisseria meningitidis NOT DETECTED NOT DETECTED Final   Pseudomonas aeruginosa NOT DETECTED NOT DETECTED Final   Candida albicans NOT DETECTED NOT DETECTED Final   Candida glabrata NOT DETECTED NOT DETECTED Final   Candida krusei NOT DETECTED NOT DETECTED Final   Candida parapsilosis NOT DETECTED NOT DETECTED Final   Candida tropicalis NOT DETECTED NOT DETECTED Final  Culture, blood (x 2)     Status: None (Preliminary result)   Collection Time: 12/14/15  5:41 AM  Result Value Ref Range Status   Specimen Description BLOOD RIGHT HAND  Final   Special Requests IN PEDIATRIC BOTTLE 4ML  Final   Culture  Setup Time   Final    GRAM POSITIVE COCCI IN CLUSTERS AEROBIC BOTTLE ONLY CRITICAL VALUE NOTED.  VALUE IS CONSISTENT WITH PREVIOUSLY REPORTED AND CALLED VALUE.    Culture GRAM POSITIVE COCCI  Final   Report  Status PENDING  Incomplete  Culture, body fluid-bottle     Status: None (Preliminary result)   Collection Time: 12/14/15 11:20 AM  Result Value Ref Range Status   Specimen Description FLUID PLEURAL RIGHT  Final   Special Requests NONE  Final   Culture NO GROWTH < 12 HOURS  Final   Report Status PENDING  Incomplete  Gram stain     Status: None   Collection Time: 12/14/15 11:20 AM  Result Value Ref Range Status   Specimen Description FLUID PLEURAL RIGHT  Final   Special Requests NONE  Final   Gram Stain   Final    FEW WBC PRESENT,BOTH PMN AND MONONUCLEAR NO ORGANISMS SEEN    Report Status 12/14/2015 FINAL  Final  MRSA PCR Screening     Status: None   Collection Time: 12/14/15 12:02 PM  Result Value Ref Range Status   MRSA by PCR NEGATIVE NEGATIVE Final    Comment:        The GeneXpert MRSA Assay (FDA approved for NASAL specimens only), is one component of a comprehensive MRSA colonization surveillance program. It is not intended to diagnose MRSA infection nor to guide or monitor treatment for MRSA infections.      Scheduled Meds: . cholecalciferol  2,000 Units Oral q morning - 10a  . citalopram  10 mg Oral Daily  . furosemide  40 mg Oral Daily  . lactulose  30 g Oral TID  . magnesium oxide  400 mg Oral BID  . multivitamin with minerals  1 tablet Oral Daily  . pantoprazole  80 mg Oral QHS  . rifaximin  550 mg Oral BID  . sodium chloride flush  3 mL Intravenous Q12H  . spironolactone  100 mg Oral Daily  . vancomycin  1,500 mg Intravenous Q24H     LOS: 1 day   Time spent: 35 minutes   Cherene Altes, MD [facility] Office  [phone] Pager - Text Page per Amion as per below:  On-Call/Text Page:      [email or web address]  password [email or web address]  If 7PM-7AM, please contact night-coverage [email or web address] Password [facility] 12/15/2015, 12:23 PM

## 2015-12-15 NOTE — Progress Notes (Signed)
Patient ambulated in hallway with PT. O2 Sats 92 and above.   Milford Cage, RN

## 2015-12-15 NOTE — Progress Notes (Signed)
PHARMACY - PHYSICIAN COMMUNICATION CRITICAL VALUE ALERT - BLOOD CULTURE IDENTIFICATION (BCID)  Results for orders placed or performed during the hospital encounter of 12/14/15  Blood Culture ID Panel (Reflexed) (Collected: 12/14/2015  5:30 AM)  Result Value Ref Range   Enterococcus species NOT DETECTED NOT DETECTED   Vancomycin resistance NOT DETECTED NOT DETECTED   Listeria monocytogenes NOT DETECTED NOT DETECTED   Staphylococcus species DETECTED (A) NOT DETECTED   Staphylococcus aureus NOT DETECTED NOT DETECTED   Methicillin resistance DETECTED (A) NOT DETECTED   Streptococcus species NOT DETECTED NOT DETECTED   Streptococcus agalactiae NOT DETECTED NOT DETECTED   Streptococcus pneumoniae NOT DETECTED NOT DETECTED   Streptococcus pyogenes NOT DETECTED NOT DETECTED   Acinetobacter baumannii NOT DETECTED NOT DETECTED   Enterobacteriaceae species NOT DETECTED NOT DETECTED   Enterobacter cloacae complex NOT DETECTED NOT DETECTED   Escherichia coli NOT DETECTED NOT DETECTED   Klebsiella oxytoca NOT DETECTED NOT DETECTED   Klebsiella pneumoniae NOT DETECTED NOT DETECTED   Proteus species NOT DETECTED NOT DETECTED   Serratia marcescens NOT DETECTED NOT DETECTED   Carbapenem resistance NOT DETECTED NOT DETECTED   Haemophilus influenzae NOT DETECTED NOT DETECTED   Neisseria meningitidis NOT DETECTED NOT DETECTED   Pseudomonas aeruginosa NOT DETECTED NOT DETECTED   Candida albicans NOT DETECTED NOT DETECTED   Candida glabrata NOT DETECTED NOT DETECTED   Candida krusei NOT DETECTED NOT DETECTED   Candida parapsilosis NOT DETECTED NOT DETECTED   Candida tropicalis NOT DETECTED NOT DETECTED    Name of physician (or Provider) Contacted: Forrest Moron, NP  Changes to prescribed antibiotics required: Pt on Zosyn and Vancomycin - no changes needed - to be narrowed further when appropriate.  Sindy Guadeloupe 12/15/2015  1:10 AM

## 2015-12-15 NOTE — Care Management Note (Signed)
Case Management Note  Patient Details  Name: Peter Larson MRN: [id number] Date of Birth: 10/21/1950  Subjective/Objective:      Per ID, pt will need to remain inpatient for bacteremia tx and diagnostic testing, will transfer to [facility] when medicaly stable for transfer.   Notified [facility] liaison and they will provide home health services when pt is discharged from [facility].                            Expected Discharge Plan:  Acute to Acute Transfer  Discharge planning Services  CM Consult  Post Acute Care Choice:  Home Health Choice offered to:  Spouse  HH Arranged:  PT, OT HH Agency:  [facility]  Status of Service:  In process, will continue to follow  Girard Cooter, RN 12/15/2015, 4:26 PM

## 2015-12-15 NOTE — Consult Note (Signed)
[name] for Infectious Disease    Date of Admission:  12/14/2015   Total days of antibiotics 2        Day 2 vancomycin, zosyn       Reason for Consult: Coagulase negative staphylococcal bacteremia    Referring Physician: Joette Catching Primary Care Physician: Antony Contras  Principal Problem:   Coagulase negative Staphylococcus bacteremia Active Problems:   Liver cirrhosis secondary to NASH   Acute hepatic encephalopathy (HCC)   OSA (obstructive sleep apnea)   Morbid obesity (HCC)   AKI (acute kidney injury) ([place])   Recurrent right pleural effusion   Dehydration   SIRS (systemic inflammatory response syndrome) ([place])   Essential hypertension, benign   Chronic diastolic congestive heart failure, NYHA class 1 (HCC)   CKD (chronic kidney disease), stage III   Anemia, chronic disease   . cholecalciferol  2,000 Units Oral q morning - 10a  . citalopram  10 mg Oral Daily  . furosemide  40 mg Oral Daily  . lactulose  30 g Oral TID  . magnesium oxide  400 mg Oral BID  . multivitamin with minerals  1 tablet Oral Daily  . pantoprazole  80 mg Oral QHS  . rifaximin  550 mg Oral BID  . sodium chloride flush  3 mL Intravenous Q12H  . spironolactone  100 mg Oral Daily  . vancomycin  1,500 mg Intravenous Q24H    Recommendations: 1. Continue treatment with vancomycin 2. Repeat blood cultures today 3. Recommend TTE  Assessment: Coagulase negative staphyloccocal bacteremia: This appears to be a true infection with 2 of 2 cultures positive in a clinical picture of acute encephalopathy. Acute infection can cause or exacerbate an episode of hepatic encephalopathy. He is immunocompromised due to his advanced cirrhosis which would make the lack of systemic inflammatory symptoms unsurprising. He does not have an obvious source of infection but is immunodeficient with very friable skin.  Vancomycin is the first line empiric therapy for coagulase negative staph bacteremia and  we do not have susceptibility data yet. It would be unreliable to transition to empiric oral antibiotics at this time, and a PICC for continued IV treatment needs clearance of blood cultures. This is community acquired staph bacteremia of an unknown duration so echocardiography to look for endocarditis is recommended.   HPI: Peter Larson is a 65 y.o. male with a history of increasingly frequent hospitalizations for hepatic encephalopathy from cirrhosis attributed to nonalcoholic steatohepatitis who presented to [facility] for worsening confusion. 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 His mental status was unimproved by an additional dose of lactulose so he came in for evaluation. Studies were significant for leukocytosis, mild lactic acidosis, mild hyponatremia suggesting dehydration and possible infection. He was given lactulose, rifaximin, and empiric antibiotics with improvement in mental status by today. Blood cultures have now returned 2/2 positive for coagulase negative staphylococcus so ID service was consulted.  Review of Systems: Review of Systems  Constitutional: Negative for fever.  HENT: Negative for nosebleeds.   Eyes: Negative for redness.  Respiratory: Negative for cough and shortness of breath.   Cardiovascular: Negative for leg swelling.  Gastrointestinal: Positive for diarrhea. Negative for abdominal pain and blood in stool.  Genitourinary: Negative for dysuria.  Skin: Negative for rash.  Neurological: Positive for tremors.  Endo/Heme/Allergies: Bruises/bleeds easily.  Psychiatric/Behavioral:  The patient is not nervous/anxious.     Past Medical History  Diagnosis Date  . Splenomegaly     slt thrombocytopenia;no complics. egd [id number] neg for varices, ?mild portal gastropathy; 02/2011 nl,novarices  . Diabetes mellitus without  complication ([place])     type two  . Hypertension   . Adenomatous colon polyp '01 & '08  . Hemorrhoids     Severe anorectal pain and anal fissure w bleeding following hemorrhoidectomy may 2010  . Family history of colon cancer     mother --38  . Seasonal allergies   . Screening     Hepatocellular Screening CT neg 05/2008, U/S neg 04/2009,05/2010  . Dysphagia     BaS/tab neg 8/09; egd's neg for ring/strict--?dysmotility  . Chronic diastolic congestive heart failure ([place])     a.  ECHO 01/13/14: EF 55-60%; moderate LVH, G2DD, biatrial enlargement; mild RVE; moderate MR; mildly elevated, pulm pressures.  . Morbid obesity ([place])   . Frequent PVCs   . Pancytopenia ([place])   . Cirrhosis ([place])     a. Due to NASH, followed by [facility].  Marland Kitchen Shortness of breath   . Arthritis     RA  " ALL OVER "  . Dilatation of aorta (HCC)     a. Mild aortic root dilitation by echo 01/13/14  . Chronic hepatitis ([place])     cirrhosis  . Hepatic encephalopathy ([place])   . CKD (chronic kidney disease)   . Pleural effusion 08/2015  . NASH (nonalcoholic steatohepatitis) dx'd 2007  . OSA on CPAP     Social History  Substance Use Topics  . Smoking status: Never Smoker   . Smokeless tobacco: Never Used  . Alcohol Use: Yes     Comment: 11/23/2015  "none since 2007's dx fatty liver disease"    Family History  Problem Relation Age of Onset  . Cancer Mother     died of breast cancer  . Liver disease Father   . Thyroid disease Sister    No Known Allergies  OBJECTIVE: Blood pressure 119/71, pulse 136, temperature 98.4 F (36.9 C), temperature source Oral, resp. rate 20, weight 135.172 kg (298 lb), SpO2 93 %.  Physical Exam GENERAL- alert, obese man in NAD HEENT- Oral mucosa appears moist, no cervical LN enlargement. CARDIAC- RRR, no murmurs. RESP- CTAB, no wheezes ABDOMEN- Large obese abdomen, nontender, no guarding or rebound, normoactive bowel sounds present NEURO- Significant weakness in upper  extremities preventing full extension, tremor worsened with intentional movement EXTREMITIES- symmetric, chronic stasis dermatitis changes at ankles b/l without edema SKIN- Extensive bruising and small skin tears on forearms PSYCH- Normal mood and affect, slightly confused performance of complicated tasks   Lab Results Lab Results  Component Value Date   WBC 6.4 12/15/2015   HGB 9.2* 12/15/2015   HCT 27.8* 12/15/2015   MCV 95.5 12/15/2015   PLT 63* 12/15/2015    Lab Results  Component Value Date   CREATININE 1.89* 12/15/2015   BUN 40* 12/15/2015   NA 133* 12/15/2015   K 4.9 12/15/2015   CL 108 12/15/2015   CO2 18* 12/15/2015    Lab Results  Component Value Date   ALT 21 12/15/2015   AST 33 12/15/2015   ALKPHOS 81 12/15/2015   BILITOT 3.1* 12/15/2015     Microbiology: Recent Results (from the past 240 hour(s))  Culture, blood (x 2)     Status: Abnormal (Preliminary result)   Collection Time: 12/14/15  5:30 AM  Result Value  Ref Range Status   Specimen Description BLOOD RIGHT ARM  Final   Special Requests BOTTLES DRAWN AEROBIC AND ANAEROBIC 10ML  Final   Culture  Setup Time   Final    GRAM POSITIVE COCCI IN CLUSTERS AEROBIC BOTTLE ONLY CRITICAL RESULT CALLED TO, READ BACK BY AND VERIFIED WITH: CARON AMEND,PHARMD @0106  12/15/15 MKELLY    Culture STAPHYLOCOCCUS SPECIES (COAGULASE NEGATIVE) (A)  Final   Report Status PENDING  Incomplete  Blood Culture ID Panel (Reflexed)     Status: Abnormal   Collection Time: 12/14/15  5:30 AM  Result Value Ref Range Status   Enterococcus species NOT DETECTED NOT DETECTED Final   Vancomycin resistance NOT DETECTED NOT DETECTED Final   Listeria monocytogenes NOT DETECTED NOT DETECTED Final   Staphylococcus species DETECTED (A) NOT DETECTED Final    Comment: CRITICAL RESULT CALLED TO, READ BACK BY AND VERIFIED WITH: CARON AMEND,PHARMD @0106  12/15/15 MKELLY    Staphylococcus aureus NOT DETECTED NOT DETECTED Final   Methicillin  resistance DETECTED (A) NOT DETECTED Final    Comment: CRITICAL RESULT CALLED TO, READ BACK BY AND VERIFIED WITH: CARON AMEND,PHARMD @0106  12/15/15    Streptococcus species NOT DETECTED NOT DETECTED Final   Streptococcus agalactiae NOT DETECTED NOT DETECTED Final   Streptococcus pneumoniae NOT DETECTED NOT DETECTED Final   Streptococcus pyogenes NOT DETECTED NOT DETECTED Final   Acinetobacter baumannii NOT DETECTED NOT DETECTED Final   Enterobacteriaceae species NOT DETECTED NOT DETECTED Final   Enterobacter cloacae complex NOT DETECTED NOT DETECTED Final   Escherichia coli NOT DETECTED NOT DETECTED Final   Klebsiella oxytoca NOT DETECTED NOT DETECTED Final   Klebsiella pneumoniae NOT DETECTED NOT DETECTED Final   Proteus species NOT DETECTED NOT DETECTED Final   Serratia marcescens NOT DETECTED NOT DETECTED Final   Carbapenem resistance NOT DETECTED NOT DETECTED Final   Haemophilus influenzae NOT DETECTED NOT DETECTED Final   Neisseria meningitidis NOT DETECTED NOT DETECTED Final   Pseudomonas aeruginosa NOT DETECTED NOT DETECTED Final   Candida albicans NOT DETECTED NOT DETECTED Final   Candida glabrata NOT DETECTED NOT DETECTED Final   Candida krusei NOT DETECTED NOT DETECTED Final   Candida parapsilosis NOT DETECTED NOT DETECTED Final   Candida tropicalis NOT DETECTED NOT DETECTED Final  Culture, blood (x 2)     Status: None (Preliminary result)   Collection Time: 12/14/15  5:41 AM  Result Value Ref Range Status   Specimen Description BLOOD RIGHT HAND  Final   Special Requests IN PEDIATRIC BOTTLE 4ML  Final   Culture  Setup Time   Final    GRAM POSITIVE COCCI IN CLUSTERS AEROBIC BOTTLE ONLY CRITICAL VALUE NOTED.  VALUE IS CONSISTENT WITH PREVIOUSLY REPORTED AND CALLED VALUE.    Culture GRAM POSITIVE COCCI  Final   Report Status PENDING  Incomplete  Culture, body fluid-bottle     Status: None (Preliminary result)   Collection Time: 12/14/15 11:20 AM  Result Value Ref Range  Status   Specimen Description FLUID PLEURAL RIGHT  Final   Special Requests NONE  Final   Culture NO GROWTH 1 DAY  Final   Report Status PENDING  Incomplete  Gram stain     Status: None   Collection Time: 12/14/15 11:20 AM  Result Value Ref Range Status   Specimen Description FLUID PLEURAL RIGHT  Final   Special Requests NONE  Final   Gram Stain   Final    FEW WBC PRESENT,BOTH PMN AND MONONUCLEAR NO ORGANISMS SEEN  Report Status 12/14/2015 FINAL  Final  MRSA PCR Screening     Status: None   Collection Time: 12/14/15 12:02 PM  Result Value Ref Range Status   MRSA by PCR NEGATIVE NEGATIVE Final    Comment:        The GeneXpert MRSA Assay (FDA approved for NASAL specimens only), is one component of a comprehensive MRSA colonization surveillance program. It is not intended to diagnose MRSA infection nor to guide or monitor treatment for MRSA infections.     Hinton Lovely, [place] for Infectious [place] [phone] pager   [phone] cell 12/15/2015, 4:15 PM

## 2015-12-15 NOTE — Care Management Note (Signed)
Case Management Note  Patient Details  Name: Peter Larson MRN: [id number] Date of Birth: March 24, 1951  Subjective/Objective:   Pt lives with spouse, is followed by [facility].  When discharged on 11/27/15, it was determined that pt needed home health PT and OT, spouse chose [facility], but referral was not received by the agency.  Pt and spouse request PT and OT services be arranged through [facility], liaison has completed referral.  Pt has cane, walker, 3-N-1, and shower chair.                             Expected Discharge Plan:  [place]  Discharge planning Services  CM Consult  Post Acute Care Choice:  Home Health Choice offered to:  Spouse  HH Arranged:  PT, OT [facility] Agency:  [facility]  Status of Service:  Completed, signed off  Girard Cooter, [place] 12/15/2015, 12:16 PM

## 2015-12-15 NOTE — Evaluation (Signed)
Physical Therapy Evaluation Patient Details Name: Peter Larson MRN: [id number] DOB: 26-Jun-1950 Today's Date: 12/15/2015   History of Present Illness  Peter Larson is a 65 y.o. male with a Past Medical History of HTN, PVCs, [place]/cirrhosis, see daily, recurrent pleural effusion, OSA/CPAP who presents with acute encephalopathy  Clinical Impression  Patient presents with decreased independence with mobility due to deficits listed in PT problem list.  He will benefit from skilled PT in the acute setting to allow return home with family support and follow up HHPT.  RN case manager aware of pt/wife report did not get call from [facility] agency to set up PT after last admission.    Follow Up Recommendations Home health PT    Equipment Recommendations  None recommended by PT    Recommendations for Other Services       Precautions / Restrictions Precautions Precautions: Fall Precaution Comments: rectal pouch      Mobility  Bed Mobility Overal bed mobility: Needs Assistance Bed Mobility: Supine to Sit     Supine to sit: Min assist;+2 for safety/equipment     General bed mobility comments: assist for lifting trunk and scooting to EOB  Transfers Overall transfer level: Needs assistance Equipment used: Rolling walker (2 wheeled) Transfers: Sit to/from Stand Sit to Stand: Min assist;+2 physical assistance         General transfer comment: assist for balance, cues for hand placement  Ambulation/Gait Ambulation/Gait assistance: Min assist;+2 safety/equipment Ambulation Distance (Feet): 80 Feet Assistive device: Rolling walker (2 wheeled) Gait Pattern/deviations: Step-through pattern;Trunk flexed;Decreased stride length     General Gait Details: assist for proximity to walker, for balance, IV and chair following  Stairs            Wheelchair Mobility    Modified Rankin (Stroke Patients Only)       Balance Overall balance assessment: Needs assistance Sitting-balance  support: Feet supported;Single extremity supported Sitting balance-Leahy Scale: Fair Sitting balance - Comments: once feet on floor and scooted out to EOB pt able to balance  Postural control: Other (comment)   Standing balance-Leahy Scale: Poor Standing balance comment: UE support for balance                             Pertinent Vitals/Pain Pain Assessment: Faces Faces Pain Scale: Hurts little more Pain Location: butticks Pain Descriptors / Indicators: Burning;Grimacing Pain Intervention(s): Repositioned;Monitored during session    Home Living Family/patient expects to be discharged to:: Private residence Living Arrangements: Spouse/significant other Available Help at Discharge: Family Type of Home: House Home Access: Ramped entrance     Home Layout: One level Home Equipment: Walker - 2 wheels;Hand held shower head;Bedside commode;Cane - single point;Grab bars - tub/shower;Shower seat      Prior Function Level of Independence: Needs assistance   Gait / Transfers Assistance Needed: walking with walker at home, wife supervising  ADL's / Homemaking Assistance Needed: assist for ADL's from wife        Hand Dominance   Dominant Hand: Right    Extremity/Trunk Assessment   Upper Extremity Assessment: Overall WFL for tasks assessed           Lower Extremity Assessment: Generalized weakness      Cervical / Trunk Assessment: Kyphotic  Communication   Communication: No difficulties  Cognition Arousal/Alertness: Awake/alert Behavior During Therapy: WFL for tasks assessed/performed   Area of Impairment: Safety/judgement;Problem solving         Safety/Judgement:  Decreased awareness of deficits;Decreased awareness of safety   Problem Solving: Slow processing      General Comments General comments (skin integrity, edema, etc.): Wife present and relates all equipment they have.  Reports they are hoping to go to [facility] for follow up as waiting on  transplant.  States feel she needs a gait belt so gave info on where to obtain.    Exercises        Assessment/Plan    PT Assessment Patient needs continued PT services  PT Diagnosis Abnormality of gait;Generalized weakness   PT Problem List Decreased balance;Decreased mobility;Decreased activity tolerance;Decreased cognition;Decreased safety awareness;Decreased knowledge of use of DME  PT Treatment Interventions DME instruction;Gait training;Balance training;Functional mobility training;Patient/family education;Therapeutic activities;Therapeutic exercise   PT Goals (Current goals can be found in the Care Plan section) Acute Rehab PT Goals Patient Stated Goal: To go home today PT Goal Formulation: With patient/family Time For Goal Achievement: 12/22/15 Potential to Achieve Goals: Fair    Frequency Min 3X/week   Barriers to discharge        Co-evaluation               End of Session Equipment Utilized During Treatment: Gait belt Activity Tolerance: Patient tolerated treatment well Patient left: in chair;with call bell/phone within reach           Time: 1105-1129 PT Time Calculation (min) (ACUTE ONLY): 24 min   Charges:   PT Evaluation $PT Eval Moderate Complexity: 1 Procedure PT Treatments $Gait Training: 8-22 mins   PT G CodesReginia Naas 12-25-15, 1:44 PM  Magda Kiel, [place] 12-25-15

## 2015-12-16 ENCOUNTER — Inpatient Hospital Stay (HOSPITAL_COMMUNITY): Payer: 59

## 2015-12-16 DIAGNOSIS — G4733 Obstructive sleep apnea (adult) (pediatric): Secondary | ICD-10-CM

## 2015-12-16 DIAGNOSIS — Z9889 Other specified postprocedural states: Secondary | ICD-10-CM

## 2015-12-16 DIAGNOSIS — K7290 Hepatic failure, unspecified without coma: Secondary | ICD-10-CM

## 2015-12-16 DIAGNOSIS — N179 Acute kidney failure, unspecified: Secondary | ICD-10-CM

## 2015-12-16 DIAGNOSIS — K7581 Nonalcoholic steatohepatitis (NASH): Secondary | ICD-10-CM

## 2015-12-16 DIAGNOSIS — I10 Essential (primary) hypertension: Secondary | ICD-10-CM

## 2015-12-16 DIAGNOSIS — K7200 Acute and subacute hepatic failure without coma: Principal | ICD-10-CM

## 2015-12-16 DIAGNOSIS — R7881 Bacteremia: Secondary | ICD-10-CM

## 2015-12-16 DIAGNOSIS — I5032 Chronic diastolic (congestive) heart failure: Secondary | ICD-10-CM

## 2015-12-16 LAB — ECHOCARDIOGRAM COMPLETE: WEIGHTICAEL: 4768 [oz_av]

## 2015-12-16 LAB — CHOLESTEROL, BODY FLUID: Cholesterol, Fluid: 10 mg/dL

## 2015-12-16 MED ORDER — VANCOMYCIN HCL 10 G IV SOLR: 1500.0000 mg | INTRAVENOUS | Status: DC

## 2015-12-16 MED ORDER — PERFLUTREN LIPID MICROSPHERE
INTRAVENOUS | Status: AC
  Filled 2015-12-16: qty 10

## 2015-12-16 MED ORDER — PERFLUTREN LIPID MICROSPHERE
1.0000 mL | INTRAVENOUS | Status: AC | PRN
  Administered 2015-12-16: 2 mL via INTRAVENOUS
  Filled 2015-12-16: qty 10

## 2015-12-16 MED ORDER — IBUPROFEN 200 MG PO TABS
200.0000 mg | ORAL_TABLET | ORAL | Status: DC | PRN
  Administered 2015-12-16 (×2): 200 mg via ORAL
  Filled 2015-12-16 (×2): qty 1

## 2015-12-16 MED ORDER — HYDROCORTISONE 1 % EX CREA: TOPICAL_CREAM | Freq: Four times a day (QID) | CUTANEOUS | Status: AC | PRN

## 2015-12-16 MED ORDER — SPIRONOLACTONE 50 MG PO TABS: 100.0000 mg | ORAL_TABLET | Freq: Every day | ORAL | Status: DC

## 2015-12-16 MED ORDER — WITCH HAZEL-GLYCERIN EX PADS: MEDICATED_PAD | CUTANEOUS | Status: DC | PRN

## 2015-12-16 MED ORDER — IBUPROFEN 200 MG PO TABS
400.0000 mg | ORAL_TABLET | Freq: Once | ORAL | Status: AC
Start: 2015-12-16 — End: 2015-12-16
  Administered 2015-12-16: 400 mg via ORAL
  Filled 2015-12-16: qty 2

## 2015-12-16 MED ORDER — IBUPROFEN 200 MG PO TABS: 200.0000 mg | ORAL_TABLET | ORAL | Status: DC | PRN

## 2015-12-16 NOTE — Progress Notes (Signed)
OT Cancellation Note  Patient Details Name: DAIYAAN MEZZANOTTE MRN: [id number] DOB: April 30, 1951   Cancelled Treatment:    Reason Eval/Treat Not Completed: OT screened, no needs identified, will sign off.  Pt for transfer to [facility] today.  Will defer OT eval to [facility], and will sign off at this time  Samara Deist, OTR/L [id number]  12/16/2015, 3:44 PM

## 2015-12-16 NOTE — Progress Notes (Signed)
Called care coordinator at [facility] regarding transport - They said they could not be here until after 7 pm. [facility] here said same thing when called - [facility] says they will transport and cannot be here until after 7 PM.

## 2015-12-16 NOTE — Progress Notes (Signed)
  Echocardiogram 2D Echocardiogram with Definity has been performed.  Jennette Dubin 12/16/2015, 12:01 PM

## 2015-12-16 NOTE — Discharge Summary (Signed)
Physician Discharge Summary  LIBERO MATHERS [id number] DOB: 1950/09/24 DOA: 12/14/2015  PCP: [name], MD  Admit date: 12/14/2015 Discharge date: 12/16/2015  Time spent: 90 minutes  Recommendations for Outpatient Follow-up:   Acute hepatic encephalopathy / NASH cirrhosis with Ascites -elevated ammonia level on admission despite adherence to medications at home -Followed by [facility] Transplant Team - currently undergoing w/u to be place on transplant list w/ next appointment 7/13 -7/13 Spoke with Dr. Criselda Peaches Hospitalist and Dr. Laurier Nancy Hepatologist both from [facility], who both agree that patient's best interest would be served by transferring patient to [facility] for further evaluation of possible liver transplant and management of bacteremia. -Dr. Criselda Peaches Hospitalist has agreed to except patient in transfer -Patient with gross fluid overload continue gentle diuresis Lasix 40 mg daily  2/2 blood cx + Meth resistant Coag neg Staph -Continue current antibiotic regimen  AKI / CKD stage III(baseline Cr ~1.4-1.5) Lab Results  Component Value Date   CREATININE 1.89* 12/15/2015   CREATININE 1.92* 12/15/2015   CREATININE 1.85* 12/14/2015   Urinary retention -thought to be due to being in bed  -In light of patient's continued urinary retention and acute injury will insert Foley catheter.  Essential hypertension, benign -Current blood pressure somewhat suboptimal so hold Toprol-XL    Chronic diastolic congestive heart failure, NYHA class 1 -compensated - weight 6/20 was 136.5kg -Strict in and out since admission + 270ml -Daily weight Filed Weights   12/15/15 1000  Weight: 135.172 kg (298 lb)  -At baseline.  Chronic right pleural effusion / hydrothorax -has undergone thoracentesis in the past but continues to reaccumulate which is not unexpected -not clinically significant at this time   OSA -Nocturnal CPAP  Anemia, chronic disease(Baseline Hg appears to be ~9.5-10.6) -  no gross evidence of blood loss, suspect "drop" since admit due to hydration - follow trend   Recent Labs Lab 12/14/15 0303 12/15/15 0349 12/15/15 1258  HGB 11.3* 8.9* 9.2*        Discharge Diagnoses:  Principal Problem:   Coagulase negative Staphylococcus bacteremia Active Problems:   OSA (obstructive sleep apnea)   Essential hypertension, benign   Chronic diastolic congestive heart failure, NYHA class 1 (HCC)   CKD (chronic kidney disease), stage III   Morbid obesity (HCC)   Liver cirrhosis secondary to NASH   Acute hepatic encephalopathy (HCC)   AKI (acute kidney injury) (HCC)   Anemia, chronic disease   Recurrent right pleural effusion   Dehydration   SIRS (systemic inflammatory response syndrome) (HCC)   S/P thoracentesis   Discharge Condition: Stable  Diet recommendation: Regular diet fluid thin  Filed Weights   12/15/15 1000  Weight: 135.172 kg (298 lb)    History of present illness:  65 y.o. WM PMHx Obesity, OSA, CKD stage III, Chronic Diastolic CHF Grade 1, HTN, NASH cirrhosis and multiple recurrent hospitalizations in the past 6-8 months for acute hepatic encephalopathy,   Who presented to the hospital with less than 24-48 hours of acute altered mentation c/w his usual hepatic encephalopathy. His wife is well versed on titrating his medical tx, and gave him an extra dose of lactulose without improvement. He was strictly compliant w/ all of his prior scheduled doses.   In the ED he was found to have an ammonia level of 91. He was tx w/ a lactulose enema, and a diagnostic paracentesis was performed. During this admission patient was treated for acute hepatic encephalopathy with lactulose. Today patient's mental status significantly improved though  thought process still somewhat sluggish. In addition patient was found to have methicillin resistant coag negative staph bacteremia and was started on vancomycin. Patient's admission, complicated by acute on  chronic renal failure, which appears to have stabilized. Secondary to patient's liver cirrhosis (NASH), contacted [facility]-spoke with Dr. Ginnie Smart [facility] Hepatology who is familiar with patient to discuss his case. It was agreed that since patient stable would be in his best interest to be transferred to [facility] for further workup for possible immediate liver transplant.      Procedures: 7/11 CT head W contrast: Normal 7/11 paracentesis - 1.7L   Consultations: PA Saverio Danker, interventional radiology Dr.John Megan Salon  ID Phone consult Dr. Criselda Peaches Hospitalist and Dr. Laurier Nancy Hepatologist both from [facility]   Cultures 7/11 blood right arm/hand positive methicillin-resistant coag negative staph 7/11 right pleural fluid NGTD 7/11 MRSA by PCR negative 7/12 blood pending   Antibiotics Zosyn 7/11 >> 7/12 Vanc 7/11 >>   Discharge Exam: Filed Vitals:   12/16/15 0800 12/16/15 0900 12/16/15 1000 12/16/15 1200  BP: 111/61 108/58 107/49 120/66  Pulse: 49 54 59 93  Temp: 97.7 F (36.5 C)   97.2 F (36.2 C)  TempSrc: Oral   Axillary  Resp: 17 16 17 20   Weight:      SpO2: 96% 100% 100% 99%    General: A/O 4, mentation somewhat slow but answers appropriately, negative respiratory distress Cardiovascular: Sinus bradycardia (asymptomatic), negative murmurs rubs gallops Respiratory: Clear to auscultation bilateral  Discharge Instructions     Medication List    STOP taking these medications        metoprolol succinate 25 MG 24 hr tablet  Commonly known as:  TOPROL-XL     milk thistle 175 MG tablet     multivitamin tablet      TAKE these medications        cholecalciferol 1000 units tablet  Commonly known as:  VITAMIN D  Take 2,000 Units by mouth every morning.     citalopram 10 MG tablet  Commonly known as:  CELEXA  Take 1 tablet by mouth daily.     esomeprazole 40 MG capsule  Commonly known as:  NEXIUM  Take 40 mg by mouth every evening.      famotidine 20 MG tablet  Commonly known as:  PEPCID  Take 20 mg by mouth daily as needed for heartburn or indigestion.     furosemide 40 MG tablet  Commonly known as:  LASIX  Take 1 tablet (40 mg total) by mouth daily.     hydrocortisone cream 1 %  Apply topically 4 (four) times daily as needed (hemorrhoidal irritation).     hydrOXYzine 25 MG tablet  Commonly known as:  ATARAX/VISTARIL  Take 0.5 tablets (12.5 mg total) by mouth at bedtime as needed for anxiety (insomnia).     ibuprofen 200 MG tablet  Commonly known as:  ADVIL,MOTRIN  Take 1 tablet (200 mg total) by mouth every 4 (four) hours as needed for mild pain or moderate pain.     lactulose 10 GM/15ML solution  Commonly known as:  CHRONULAC  Take 45 mLs (30 g total) by mouth 3 (three) times daily. Please increase up to 40g three times a day if not achieving 4-5 BM daily or patient mentation is worsening.     magnesium oxide 400 (241.3 Mg) MG tablet  Commonly known as:  MAGOX 400  Take 1 tablet (400 mg total) by mouth 2 (two) times daily.  rifaximin 550 MG Tabs tablet  Commonly known as:  XIFAXAN  Take 1 tablet (550 mg total) by mouth 2 (two) times daily.     simethicone 80 MG chewable tablet  Commonly known as:  MYLICON  Chew 1 tablet (80 mg total) by mouth 2 (two) times daily.     spironolactone 50 MG tablet  Commonly known as:  ALDACTONE  Take 2 tablets (100 mg total) by mouth daily.     vancomycin 1,500 mg in sodium chloride 0.9 % 500 mL  Inject 1,500 mg into the vein daily.     witch hazel-glycerin pad  Commonly known as:  TUCKS  Apply topically as needed for irritation or hemorrhoids.     zinc sulfate 220 (50 Zn) MG capsule  Take 220 mg by mouth daily.       No Known Allergies Follow-up Information    Follow up with [facility].   Why:  Physical and Occupational Therapy   Contact information:   [address] [place] [address] [phone]        The results  of significant diagnostics from this hospitalization (including imaging, microbiology, ancillary and laboratory) are listed below for reference.    Significant Diagnostic Studies: Dg Chest 1 View  12/14/2015  CLINICAL DATA:  Status post thoracentesis on the right EXAM: CHEST 1 VIEW COMPARISON:  December 14, 2015 study obtained earlier in the day FINDINGS: No pneumothorax. Right pleural effusion is slightly smaller post thoracentesis. Moderate effusion remains on the right with right base atelectasis. Left lung is clear. Heart is mildly enlarged with pulmonary vascularity within normal limits. No adenopathy. No bone lesions. IMPRESSION: No pneumothorax. Persistent effusion on the right with right base atelectasis. Lungs elsewhere clear. Stable cardiac prominence. Electronically Signed   By: Lowella Grip III M.D.   On: 12/14/2015 11:46   Dg Chest 1 View  11/19/2015  CLINICAL DATA:  Status post right thoracentesis today. Postprocedural examination. EXAM: CHEST 1 VIEW COMPARISON:  Single-view of the chest earlier today. FINDINGS: Right pleural effusion is decreased after thoracentesis. No pneumothorax is identified. The left lung is clear. No left pleural effusion is identified. There is cardiomegaly. IMPRESSION: Decreased right pleural effusion after thoracentesis. Negative for pneumothorax. Electronically Signed   By: Inge Rise M.D.   On: 11/19/2015 15:21   Dg Chest 1 View  11/17/2015  CLINICAL DATA:  Shortness of breath EXAM: CHEST 1 VIEW COMPARISON:  09/06/2015 FINDINGS: The tail like opacification of the entire right lung is new from the previous exam and is favored to represent enlarging right pleural effusion. Left lung is clear. There is pulmonary vascular congestion. IMPRESSION: 1. Veil like opacification of the right lung. Unchanged from previous exam. Electronically Signed   By: Kerby Moors M.D.   On: 11/17/2015 08:45   Dg Chest 2 View  11/23/2015  CLINICAL DATA:  Altered mental  status, history diabetes mellitus, hypertension EXAM: CHEST  2 VIEW COMPARISON:  11/19/2015 FINDINGS: Normal heart size mediastinal contours. Large RIGHT pleural effusion and basilar atelectasis. Decreased lung volumes. LEFT lung grossly clear. No pneumothorax. Chronic RIGHT rotator cuff tear. IMPRESSION: Large RIGHT pleural effusion with RIGHT basilar atelectasis. Electronically Signed   By: Lavonia Dana M.D.   On: 11/23/2015 13:10   Ct Head Wo Contrast  12/14/2015  CLINICAL DATA:  Worsening confusion. Recurrent RIGHT pleural effusion. History of hypertension diabetes, chronic kidney disease, cirrhosis. EXAM: CT HEAD WITHOUT CONTRAST TECHNIQUE: Contiguous axial images were obtained from the base  of the skull through the vertex without intravenous contrast. COMPARISON:  CT HEAD November 23, 2015 FINDINGS: Mildly motion degraded examination. INTRACRANIAL CONTENTS: The ventricles and sulci are normal for age. No intraparenchymal hemorrhage, mass effect nor midline shift. No acute large vascular territory infarcts. No abnormal extra-axial fluid collections. Basal cisterns are patent. Minimal calcific atherosclerosis of the carotid siphons. ORBITS: The included ocular globes and orbital contents are non-suspicious. SINUSES: The mastoid aircells and included paranasal sinuses are well-aerated. SKULL/SOFT TISSUES: No skull fracture. No significant soft tissue swelling. IMPRESSION: Negative mildly motion degraded CT HEAD Electronically Signed   By: Elon Alas M.D.   On: 12/14/2015 04:02   Ct Head Wo Contrast  11/23/2015  CLINICAL DATA:  mental status changes, lethargy EXAM: CT HEAD WITHOUT CONTRAST TECHNIQUE: Contiguous axial images were obtained from the base of the skull through the vertex without intravenous contrast. COMPARISON:  11/16/2015 FINDINGS: Brain: No intracranial hemorrhage, mass effect or midline shift. No acute cortical infarction. Mild cerebral atrophy. No mass lesion is noted on this unenhanced  scan. No intra or extra-axial fluid collection. Vascular: No hyperdense vessel or unexpected calcification. Skull: Negative for fracture or focal lesion. Sinuses/Orbits: No acute findings. Other: None IMPRESSION: No acute intracranial abnormality. Mild cerebral atrophy. No definite acute cortical infarction. Electronically Signed   By: Lahoma Crocker M.D.   On: 11/23/2015 14:37   Dg Chest Portable 1 View  12/14/2015  CLINICAL DATA:  Gradually worsening altered mental status starting yesterday. History of recurrent right-sided pleural effusions. EXAM: PORTABLE CHEST 1 VIEW COMPARISON:  11/23/2015 FINDINGS: Shallow inspiration. Large right pleural effusion for cyst. Atelectasis or infiltration in the right lung. Appearance is similar to previous study. Heart size is mildly enlarged. No pneumothorax. IMPRESSION: Persistent right pleural effusion with infiltration or atelectasis in the right lung. No significant improvement since previous study. Electronically Signed   By: Lucienne Capers M.D.   On: 12/14/2015 04:07   Dg Chest Port 1 View  11/19/2015  CLINICAL DATA:  Shortness of breath EXAM: PORTABLE CHEST 1 VIEW COMPARISON:  11/17/2015 chest radiograph. FINDINGS: Stable cardiomediastinal silhouette with normal heart size. No pneumothorax. Moderate right pleural effusion, not appreciably changed. No left pleural effusion. Patchy opacity throughout the right lung, not appreciably changed. No acute consolidative airspace disease in the left lung. IMPRESSION: Stable moderate right pleural effusion and patchy opacity throughout the right lung, favoring atelectasis, with a component of pneumonia or underlying lung mass not excluded. Electronically Signed   By: Ilona Sorrel M.D.   On: 11/19/2015 11:29   US Thoracentesis Asp Pleural Space W/img Guide  12/14/2015  INDICATION: NASH, cirrhosis, recurrent right pleural effusion. Request is made for diagnostic and therapeutic thoracentesis. EXAM: ULTRASOUND GUIDED diagnostic  and therapeutic THORACENTESIS MEDICATIONS: 1% lidocaine. COMPLICATIONS: None immediate. PROCEDURE: An ultrasound guided thoracentesis was thoroughly discussed with the patient's wife and questions answered. The benefits, risks, alternatives and complications were also discussed. The patient's wife understands and wishes to proceed with the procedure. Written consent was obtained. Ultrasound was performed to localize and mark an adequate pocket of fluid in the right chest. The area was then prepped and draped in the normal sterile fashion. 1% Lidocaine was used for local anesthesia. Under ultrasound guidance a Safe-T-Centesis catheter was introduced. Thoracentesis was performed. The catheter was removed and a dressing applied. FINDINGS: A total of approximately 1.7 L of slightly cloudy yellow fluid was removed. Samples were sent to the laboratory as requested by the clinical team. IMPRESSION: Successful ultrasound guided right  thoracentesis yielding 1.7 L of pleural fluid. The procedure was terminated at this time secondary to hypotension. The patient was alert and talking. He did not have any complaints. He was stable. The primary team was called to inform them of this finding. Read by: Saverio Danker, PA-C Electronically Signed   By: Sandi Mariscal M.D.   On: 12/14/2015 11:33   US Thoracentesis Asp Pleural Space W/img Guide  11/19/2015  INDICATION: 65 year old with fatty liver disease with a right pleural effusion. Request is made today for a diagnostic and therapeutic thoracentesis EXAM: ULTRASOUND GUIDED DIAGNOSTIC AND THERAPEUTIC THORACENTESIS MEDICATIONS: 1% lidocaine COMPLICATIONS: None immediate. PROCEDURE: An ultrasound guided thoracentesis was thoroughly discussed with the patient and his wife and questions answered. The benefits, risks, alternatives and complications were also discussed. The patient's wife understands and wishes to proceed with the procedure. Written consent was obtained. Ultrasound was  performed to localize and mark an adequate pocket of fluid in the right chest. The area was then prepped and draped in the normal sterile fashion. 1% Lidocaine was used for local anesthesia. A Safe-T-Centesis catheter was introduced. Thoracentesis was performed. The catheter was removed and a dressing applied. FINDINGS: A total of approximately 1.5 L of yellow fluid was removed. Samples were sent to the laboratory as requested by the clinical team. IMPRESSION: Successful ultrasound guided right thoracentesis yielding 1.5 L of pleural fluid. Given this was the patient's first thoracentesis, the procedure was terminated at 1.5 L. There was a small residual amount of effusion remaining on ultrasound imaging. Read by: Saverio Danker, PA-C Electronically Signed   By: Marybelle Killings M.D.   On: 11/19/2015 15:52    Microbiology: Recent Results (from the past 240 hour(s))  Culture, blood (x 2)     Status: Abnormal (Preliminary result)   Collection Time: 12/14/15  5:30 AM  Result Value Ref Range Status   Specimen Description BLOOD RIGHT ARM  Final   Special Requests BOTTLES DRAWN AEROBIC AND ANAEROBIC 10ML  Final   Culture  Setup Time   Final    GRAM POSITIVE COCCI IN CLUSTERS IN BOTH AEROBIC AND ANAEROBIC BOTTLES CRITICAL RESULT CALLED TO, READ BACK BY AND VERIFIED WITH: CARON AMEND,PHARMD @0106  12/15/15 MKELLY    Culture (A)  Final    STAPHYLOCOCCUS SPECIES (COAGULASE NEGATIVE) SUSCEPTIBILITIES TO FOLLOW    Report Status PENDING  Incomplete  Blood Culture ID Panel (Reflexed)     Status: Abnormal   Collection Time: 12/14/15  5:30 AM  Result Value Ref Range Status   Enterococcus species NOT DETECTED NOT DETECTED Final   Vancomycin resistance NOT DETECTED NOT DETECTED Final   Listeria monocytogenes NOT DETECTED NOT DETECTED Final   Staphylococcus species DETECTED (A) NOT DETECTED Final    Comment: CRITICAL RESULT CALLED TO, READ BACK BY AND VERIFIED WITH: CARON AMEND,PHARMD @0106  12/15/15 MKELLY     Staphylococcus aureus NOT DETECTED NOT DETECTED Final   Methicillin resistance DETECTED (A) NOT DETECTED Final    Comment: CRITICAL RESULT CALLED TO, READ BACK BY AND VERIFIED WITH: CARON AMEND,PHARMD @0106  12/15/15    Streptococcus species NOT DETECTED NOT DETECTED Final   Streptococcus agalactiae NOT DETECTED NOT DETECTED Final   Streptococcus pneumoniae NOT DETECTED NOT DETECTED Final   Streptococcus pyogenes NOT DETECTED NOT DETECTED Final   Acinetobacter baumannii NOT DETECTED NOT DETECTED Final   Enterobacteriaceae species NOT DETECTED NOT DETECTED Final   Enterobacter cloacae complex NOT DETECTED NOT DETECTED Final   Escherichia coli NOT DETECTED NOT DETECTED Final   Klebsiella  oxytoca NOT DETECTED NOT DETECTED Final   Klebsiella pneumoniae NOT DETECTED NOT DETECTED Final   Proteus species NOT DETECTED NOT DETECTED Final   Serratia marcescens NOT DETECTED NOT DETECTED Final   Carbapenem resistance NOT DETECTED NOT DETECTED Final   Haemophilus influenzae NOT DETECTED NOT DETECTED Final   Neisseria meningitidis NOT DETECTED NOT DETECTED Final   Pseudomonas aeruginosa NOT DETECTED NOT DETECTED Final   Candida albicans NOT DETECTED NOT DETECTED Final   Candida glabrata NOT DETECTED NOT DETECTED Final   Candida krusei NOT DETECTED NOT DETECTED Final   Candida parapsilosis NOT DETECTED NOT DETECTED Final   Candida tropicalis NOT DETECTED NOT DETECTED Final  Culture, blood (x 2)     Status: None (Preliminary result)   Collection Time: 12/14/15  5:41 AM  Result Value Ref Range Status   Specimen Description BLOOD RIGHT HAND  Final   Special Requests IN PEDIATRIC BOTTLE 4ML  Final   Culture  Setup Time   Final    GRAM POSITIVE COCCI IN CLUSTERS AEROBIC BOTTLE ONLY CRITICAL VALUE NOTED.  VALUE IS CONSISTENT WITH PREVIOUSLY REPORTED AND CALLED VALUE.    Culture GRAM POSITIVE COCCI IDENTIFICATION TO FOLLOW   Final   Report Status PENDING  Incomplete  Culture, body fluid-bottle      Status: None (Preliminary result)   Collection Time: 12/14/15 11:20 AM  Result Value Ref Range Status   Specimen Description FLUID PLEURAL RIGHT  Final   Special Requests NONE  Final   Culture NO GROWTH 2 DAYS  Final   Report Status PENDING  Incomplete  Gram stain     Status: None   Collection Time: 12/14/15 11:20 AM  Result Value Ref Range Status   Specimen Description FLUID PLEURAL RIGHT  Final   Special Requests NONE  Final   Gram Stain   Final    FEW WBC PRESENT,BOTH PMN AND MONONUCLEAR NO ORGANISMS SEEN    Report Status 12/14/2015 FINAL  Final  MRSA PCR Screening     Status: None   Collection Time: 12/14/15 12:02 PM  Result Value Ref Range Status   MRSA by PCR NEGATIVE NEGATIVE Final    Comment:        The GeneXpert MRSA Assay (FDA approved for NASAL specimens only), is one component of a comprehensive MRSA colonization surveillance program. It is not intended to diagnose MRSA infection nor to guide or monitor treatment for MRSA infections.   Culture, blood (routine x 2)     Status: None (Preliminary result)   Collection Time: 12/15/15  1:10 PM  Result Value Ref Range Status   Specimen Description BLOOD RIGHT ARM  Final   Special Requests BOTTLES DRAWN AEROBIC ONLY 3CC  Final   Culture NO GROWTH 1 DAY  Final   Report Status PENDING  Incomplete  Culture, blood (routine x 2)     Status: None (Preliminary result)   Collection Time: 12/15/15  2:55 PM  Result Value Ref Range Status   Specimen Description BLOOD RIGHT ARM  Final   Special Requests BOTTLES DRAWN AEROBIC ONLY 5CC  Final   Culture NO GROWTH 1 DAY  Final   Report Status PENDING  Incomplete     Labs: Basic Metabolic Panel:  Recent Labs Lab 12/14/15 0303 12/14/15 1228 12/15/15 0349 12/15/15 1258  NA 131*  --  134* 133*  K 5.4*  --  4.9 4.9  CL 105  --  108 108  CO2 19*  --  21* 18*  GLUCOSE 117*  --  83 101*  BUN 37*  --  39* 40*  CREATININE 1.85*  --  1.92* 1.89*  CALCIUM 8.7*  --  8.5* 8.4*   MG  --  2.4  --   --   PHOS  --  3.7  --   --    Liver Function Tests:  Recent Labs Lab 12/14/15 0303 12/15/15 0349  AST 47* 33  ALT 27 21  ALKPHOS 119 81  BILITOT 2.9* 3.1*  PROT 5.6* 5.0*  ALBUMIN 2.7* 2.6*   No results for input(s): LIPASE, AMYLASE in the last 168 hours.  Recent Labs Lab 12/14/15 0303 12/15/15 0349  AMMONIA 91* 31   CBC:  Recent Labs Lab 12/14/15 0303 12/15/15 0349 12/15/15 1258  WBC 17.5* 6.1 6.4  NEUTROABS 14.8*  --   --   HGB 11.3* 8.9* 9.2*  HCT 33.9* 26.5* 27.8*  MCV 94.2 94.6 95.5  PLT 71* 48* 63*   Cardiac Enzymes: No results for input(s): CKTOTAL, CKMB, CKMBINDEX, TROPONINI in the last 168 hours. BNP: BNP (last 3 results)  Recent Labs  06/30/15 1948 07/01/15 0414 09/06/15 0030  BNP 80.9 89.0 36.1    ProBNP (last 3 results) No results for input(s): PROBNP in the last 8760 hours.  CBG: No results for input(s): GLUCAP in the last 168 hours.     Signed:  Dia Crawford, MD [facility] [phone] pager

## 2015-12-16 NOTE — Progress Notes (Signed)
Pt discharged to [facility], transported via [place], wife Dorian Pod notified of pt leaving.

## 2015-12-16 NOTE — Progress Notes (Signed)
Spoke with Corene Cornea of [employer] from [facility] @ [phone] regarding patient's transfer.  Patient is not currently on the waiting list.  Dr. Sherral Hammers is updated on this matter.

## 2015-12-16 NOTE — Progress Notes (Signed)
[facility]    Date of Admission:  12/14/2015   Total days of antibiotics 3        Day 3 of vancomycin  Principal Problem:   Coagulase negative Staphylococcus bacteremia Active Problems:   Liver cirrhosis secondary to NASH   Acute hepatic encephalopathy (HCC)   OSA (obstructive sleep apnea)   Morbid obesity (HCC)   AKI (acute kidney injury) ([place])   Recurrent right pleural effusion   Dehydration   SIRS (systemic inflammatory response syndrome) (HCC)   Essential hypertension, benign   Chronic diastolic congestive heart failure, NYHA class 1 (HCC)   CKD (chronic kidney disease), stage III   Anemia, chronic disease   . cholecalciferol  2,000 Units Oral q morning - 10a  . citalopram  10 mg Oral Daily  . furosemide  40 mg Oral Daily  . lactulose  30 g Oral TID  . magnesium oxide  400 mg Oral BID  . multivitamin with minerals  1 tablet Oral Daily  . pantoprazole  80 mg Oral QHS  . rifaximin  550 mg Oral BID  . sodium chloride flush  3 mL Intravenous Q12H  . spironolactone  100 mg Oral Daily  . vancomycin  1,500 mg Intravenous Q24H    SUBJECTIVE: Peter Larson is feeling well this morning with decreased confusion. He has a mild epigastric abdominal pain since last night and feels his tremors are worse than baseline.  Review of Systems: Review of Systems  Respiratory: Negative for shortness of breath.   Cardiovascular: Negative for chest pain.  Gastrointestinal: Positive for abdominal pain and diarrhea. Negative for nausea and vomiting.  Skin: Negative for rash.  Neurological: Positive for tremors and weakness. Negative for sensory change.  Endo/Heme/Allergies: Bruises/bleeds easily.  Psychiatric/Behavioral: Negative for hallucinations.    Past Medical History  Diagnosis Date  . Splenomegaly     slt thrombocytopenia;no complics. egd [id number] neg for varices, ?mild portal gastropathy; 02/2011 nl,novarices  . Diabetes mellitus without  complication ([place])     type two  . Hypertension   . Adenomatous colon polyp '01 & '08  . Hemorrhoids     Severe anorectal pain and anal fissure w bleeding following hemorrhoidectomy may 2010  . Family history of colon cancer     mother --57  . Seasonal allergies   . Screening     Hepatocellular Screening CT neg 05/2008, U/S neg 04/2009,05/2010  . Dysphagia     BaS/tab neg 8/09; egd's neg for ring/strict--?dysmotility  . Chronic diastolic congestive heart failure ([place])     a.  ECHO 01/13/14: EF 55-60%; moderate LVH, G2DD, biatrial enlargement; mild RVE; moderate MR; mildly elevated, pulm pressures.  . Morbid obesity ([place])   . Frequent PVCs   . Pancytopenia ([place])   . Cirrhosis ([place])     a. Due to NASH, followed by [facility].  Marland Kitchen Shortness of breath   . Arthritis     RA  " ALL OVER "  . Dilatation of aorta (HCC)     a. Mild aortic root dilitation by echo 01/13/14  . Chronic hepatitis ([place])     cirrhosis  . Hepatic encephalopathy ([place])   . CKD (chronic kidney disease)   . Pleural effusion 08/2015  . NASH (nonalcoholic steatohepatitis) dx'd 2007  . OSA on CPAP     Social History  Substance Use Topics  . Smoking status: Never Smoker   . Smokeless tobacco: Never Used  .  Alcohol Use: Yes     Comment: 11/23/2015  "none since 2007's dx fatty liver disease"    Family History  Problem Relation Age of Onset  . Cancer Mother     died of breast cancer  . Liver disease Father   . Thyroid disease Sister    No Known Allergies  OBJECTIVE: Filed Vitals:   12/16/15 0800 12/16/15 0900 12/16/15 1000 12/16/15 1200  BP: 111/61 108/58 107/49 120/66  Pulse: 49 54 59 93  Temp: 97.7 F (36.5 C)   97.2 F (36.2 C)  TempSrc: Oral   Axillary  Resp: 17 16 17 20   Weight:      SpO2: 96% 100% 100% 99%   Body mass index is 38.24 kg/(m^2).  Physical Exam GENERAL- alert, obese man in NAD HEENT- Oral mucosa appears moist, no cervical LN enlargement. CARDIAC- RRR, distant heart  sounds RESP- CTAB, distant breath sounds, no wheezes ABDOMEN- Large obese abdomen, nontender, no guarding or rebound, normoactive bowel sounds present NEURO- Upper extremity tremor worsened with movement EXTREMITIES- symmetric, chronic stasis dermatitis changes at ankles b/l without edema SKIN- Extensive bruising and small skin tears on forearms   Lab Results Lab Results  Component Value Date   WBC 6.4 12/15/2015   HGB 9.2* 12/15/2015   HCT 27.8* 12/15/2015   MCV 95.5 12/15/2015   PLT 63* 12/15/2015    Lab Results  Component Value Date   CREATININE 1.89* 12/15/2015   BUN 40* 12/15/2015   NA 133* 12/15/2015   K 4.9 12/15/2015   CL 108 12/15/2015   CO2 18* 12/15/2015    Lab Results  Component Value Date   ALT 21 12/15/2015   AST 33 12/15/2015   ALKPHOS 81 12/15/2015   BILITOT 3.1* 12/15/2015     Microbiology: Recent Results (from the past 240 hour(s))  Culture, blood (x 2)     Status: Abnormal (Preliminary result)   Collection Time: 12/14/15  5:30 AM  Result Value Ref Range Status   Specimen Description BLOOD RIGHT ARM  Final   Special Requests BOTTLES DRAWN AEROBIC AND ANAEROBIC 10ML  Final   Culture  Setup Time   Final    GRAM POSITIVE COCCI IN CLUSTERS IN BOTH AEROBIC AND ANAEROBIC BOTTLES CRITICAL RESULT CALLED TO, READ BACK BY AND VERIFIED WITH: CARON AMEND,PHARMD @0106  12/15/15 MKELLY    Culture (A)  Final    STAPHYLOCOCCUS SPECIES (COAGULASE NEGATIVE) SUSCEPTIBILITIES TO FOLLOW    Report Status PENDING  Incomplete  Blood Culture ID Panel (Reflexed)     Status: Abnormal   Collection Time: 12/14/15  5:30 AM  Result Value Ref Range Status   Enterococcus species NOT DETECTED NOT DETECTED Final   Vancomycin resistance NOT DETECTED NOT DETECTED Final   Listeria monocytogenes NOT DETECTED NOT DETECTED Final   Staphylococcus species DETECTED (A) NOT DETECTED Final    Comment: CRITICAL RESULT CALLED TO, READ BACK BY AND VERIFIED WITH: CARON AMEND,PHARMD @0106   12/15/15 MKELLY    Staphylococcus aureus NOT DETECTED NOT DETECTED Final   Methicillin resistance DETECTED (A) NOT DETECTED Final    Comment: CRITICAL RESULT CALLED TO, READ BACK BY AND VERIFIED WITH: CARON AMEND,PHARMD @0106  12/15/15    Streptococcus species NOT DETECTED NOT DETECTED Final   Streptococcus agalactiae NOT DETECTED NOT DETECTED Final   Streptococcus pneumoniae NOT DETECTED NOT DETECTED Final   Streptococcus pyogenes NOT DETECTED NOT DETECTED Final   Acinetobacter baumannii NOT DETECTED NOT DETECTED Final   Enterobacteriaceae species NOT DETECTED NOT DETECTED Final  Enterobacter cloacae complex NOT DETECTED NOT DETECTED Final   Escherichia coli NOT DETECTED NOT DETECTED Final   Klebsiella oxytoca NOT DETECTED NOT DETECTED Final   Klebsiella pneumoniae NOT DETECTED NOT DETECTED Final   Proteus species NOT DETECTED NOT DETECTED Final   Serratia marcescens NOT DETECTED NOT DETECTED Final   Carbapenem resistance NOT DETECTED NOT DETECTED Final   Haemophilus influenzae NOT DETECTED NOT DETECTED Final   Neisseria meningitidis NOT DETECTED NOT DETECTED Final   Pseudomonas aeruginosa NOT DETECTED NOT DETECTED Final   Candida albicans NOT DETECTED NOT DETECTED Final   Candida glabrata NOT DETECTED NOT DETECTED Final   Candida krusei NOT DETECTED NOT DETECTED Final   Candida parapsilosis NOT DETECTED NOT DETECTED Final   Candida tropicalis NOT DETECTED NOT DETECTED Final  Culture, blood (x 2)     Status: None (Preliminary result)   Collection Time: 12/14/15  5:41 AM  Result Value Ref Range Status   Specimen Description BLOOD RIGHT HAND  Final   Special Requests IN PEDIATRIC BOTTLE 4ML  Final   Culture  Setup Time   Final    GRAM POSITIVE COCCI IN CLUSTERS AEROBIC BOTTLE ONLY CRITICAL VALUE NOTED.  VALUE IS CONSISTENT WITH PREVIOUSLY REPORTED AND CALLED VALUE.    Culture GRAM POSITIVE COCCI IDENTIFICATION TO FOLLOW   Final   Report Status PENDING  Incomplete  Culture,  body fluid-bottle     Status: None (Preliminary result)   Collection Time: 12/14/15 11:20 AM  Result Value Ref Range Status   Specimen Description FLUID PLEURAL RIGHT  Final   Special Requests NONE  Final   Culture NO GROWTH 1 DAY  Final   Report Status PENDING  Incomplete  Gram stain     Status: None   Collection Time: 12/14/15 11:20 AM  Result Value Ref Range Status   Specimen Description FLUID PLEURAL RIGHT  Final   Special Requests NONE  Final   Gram Stain   Final    FEW WBC PRESENT,BOTH PMN AND MONONUCLEAR NO ORGANISMS SEEN    Report Status 12/14/2015 FINAL  Final  MRSA PCR Screening     Status: None   Collection Time: 12/14/15 12:02 PM  Result Value Ref Range Status   MRSA by PCR NEGATIVE NEGATIVE Final    Comment:        The GeneXpert MRSA Assay (FDA approved for NASAL specimens only), is one component of a comprehensive MRSA colonization surveillance program. It is not intended to diagnose MRSA infection nor to guide or monitor treatment for MRSA infections.     ASSESSMENT: Coagulase negative staphylococcal bacteremia: Peter Larson is improving at day 3 of treatment for his bacteremia today and did not seem confused. His pleural fluid analysis from 7/11 thoracentesis is transudative with negative culture to date. Repeat blood cultures are still in process, but if these are negative he will have quickly cleared this bacteremia. Typical treatment is recommended for 7-14 days. He is immunocompromised, but if he has quickly cleared bacteremia and TTE is negative we could consider 7-10 days as a good duration. This final decision may be made at [facility] if he does proceed with transfer in the next few days.  The source of his infection is most likely the skin since his is very friable. His wife reports frequent skin tears when removing bandaids at home. On review his last 2 admissions for encephalopathy were in the setting of a urinary tract infection and with significant  dehydration. This is consistent with infection as  a trigger for his encephalopathy since he seems adherent on his outpatient treatments.   PLAN: 1. Continue Vancomycin 2. Follow up TTE, repeated blood cultures  Collier Salina, MD PGY-II Internal Medicine Resident Pager# [phone] 12/16/2015, 1:36 PM

## 2015-12-17 LAB — CULTURE, BLOOD (ROUTINE X 2)

## 2015-12-19 LAB — CULTURE, BODY FLUID-BOTTLE: CULTURE: NO GROWTH

## 2015-12-19 LAB — CULTURE, BODY FLUID W GRAM STAIN -BOTTLE

## 2015-12-20 LAB — CULTURE, BLOOD (ROUTINE X 2)
Culture: NO GROWTH
Culture: NO GROWTH

## 2015-12-31 ENCOUNTER — Emergency Department (HOSPITAL_COMMUNITY): Payer: 59

## 2015-12-31 ENCOUNTER — Inpatient Hospital Stay (HOSPITAL_COMMUNITY)
Admission: EM | Admit: 2015-12-31 | Discharge: 2016-01-01 | DRG: 682 | Disposition: A | Discharge status: Other Acute Inpatient Hospital | Payer: 59 | Attending: Internal Medicine | Admitting: Internal Medicine

## 2015-12-31 ENCOUNTER — Encounter (HOSPITAL_COMMUNITY): Payer: Self-pay | Admitting: *Deleted

## 2015-12-31 DIAGNOSIS — L8991 Pressure ulcer of unspecified site, stage 1: Secondary | ICD-10-CM | POA: Diagnosis present

## 2015-12-31 DIAGNOSIS — R531 Weakness: Secondary | ICD-10-CM

## 2015-12-31 DIAGNOSIS — K739 Chronic hepatitis, unspecified: Secondary | ICD-10-CM | POA: Diagnosis present

## 2015-12-31 DIAGNOSIS — J90 Pleural effusion, not elsewhere classified: Secondary | ICD-10-CM | POA: Diagnosis not present

## 2015-12-31 DIAGNOSIS — E872 Acidosis: Secondary | ICD-10-CM | POA: Diagnosis present

## 2015-12-31 DIAGNOSIS — N189 Chronic kidney disease, unspecified: Secondary | ICD-10-CM

## 2015-12-31 DIAGNOSIS — D631 Anemia in chronic kidney disease: Secondary | ICD-10-CM | POA: Diagnosis present

## 2015-12-31 DIAGNOSIS — M069 Rheumatoid arthritis, unspecified: Secondary | ICD-10-CM | POA: Diagnosis present

## 2015-12-31 DIAGNOSIS — Z6841 Body Mass Index (BMI) 40.0 and over, adult: Secondary | ICD-10-CM | POA: Diagnosis not present

## 2015-12-31 DIAGNOSIS — N183 Chronic kidney disease, stage 3 unspecified: Secondary | ICD-10-CM | POA: Diagnosis present

## 2015-12-31 DIAGNOSIS — T368X5A Adverse effect of other systemic antibiotics, initial encounter: Secondary | ICD-10-CM

## 2015-12-31 DIAGNOSIS — K7290 Hepatic failure, unspecified without coma: Secondary | ICD-10-CM | POA: Diagnosis present

## 2015-12-31 DIAGNOSIS — N141 Nephropathy induced by other drugs, medicaments and biological substances: Secondary | ICD-10-CM | POA: Diagnosis present

## 2015-12-31 DIAGNOSIS — I130 Hypertensive heart and chronic kidney disease with heart failure and stage 1 through stage 4 chronic kidney disease, or unspecified chronic kidney disease: Secondary | ICD-10-CM | POA: Diagnosis present

## 2015-12-31 DIAGNOSIS — K7460 Unspecified cirrhosis of liver: Secondary | ICD-10-CM | POA: Diagnosis not present

## 2015-12-31 DIAGNOSIS — D6959 Other secondary thrombocytopenia: Secondary | ICD-10-CM | POA: Diagnosis present

## 2015-12-31 DIAGNOSIS — E1122 Type 2 diabetes mellitus with diabetic chronic kidney disease: Secondary | ICD-10-CM | POA: Diagnosis present

## 2015-12-31 DIAGNOSIS — I5032 Chronic diastolic (congestive) heart failure: Secondary | ICD-10-CM | POA: Diagnosis present

## 2015-12-31 DIAGNOSIS — G4733 Obstructive sleep apnea (adult) (pediatric): Secondary | ICD-10-CM | POA: Diagnosis not present

## 2015-12-31 DIAGNOSIS — Z79899 Other long term (current) drug therapy: Secondary | ICD-10-CM | POA: Diagnosis not present

## 2015-12-31 DIAGNOSIS — N179 Acute kidney failure, unspecified: Principal | ICD-10-CM

## 2015-12-31 DIAGNOSIS — K767 Hepatorenal syndrome: Secondary | ICD-10-CM | POA: Diagnosis present

## 2015-12-31 DIAGNOSIS — N144 Toxic nephropathy, not elsewhere classified: Secondary | ICD-10-CM | POA: Diagnosis not present

## 2015-12-31 DIAGNOSIS — L8990 Pressure ulcer of unspecified site, unspecified stage: Secondary | ICD-10-CM | POA: Insufficient documentation

## 2015-12-31 DIAGNOSIS — K7581 Nonalcoholic steatohepatitis (NASH): Secondary | ICD-10-CM | POA: Diagnosis present

## 2015-12-31 DIAGNOSIS — N390 Urinary tract infection, site not specified: Secondary | ICD-10-CM | POA: Diagnosis present

## 2015-12-31 DIAGNOSIS — D696 Thrombocytopenia, unspecified: Secondary | ICD-10-CM | POA: Diagnosis present

## 2015-12-31 DIAGNOSIS — N1419 Nephropathy induced by other drugs, medicaments and biological substances: Secondary | ICD-10-CM | POA: Diagnosis present

## 2015-12-31 LAB — PROTIME-INR
INR: 1.96
PROTHROMBIN TIME: 22.6 s — AB (ref 11.4–15.2)

## 2015-12-31 LAB — COMPREHENSIVE METABOLIC PANEL
ALBUMIN: 2.9 g/dL — AB (ref 3.5–5.0)
ALT: 20 U/L (ref 17–63)
ANION GAP: 9 (ref 5–15)
AST: 38 U/L (ref 15–41)
Alkaline Phosphatase: 138 U/L — ABNORMAL HIGH (ref 38–126)
BUN: 91 mg/dL — ABNORMAL HIGH (ref 6–20)
CO2: 14 mmol/L — AB (ref 22–32)
Calcium: 8.1 mg/dL — ABNORMAL LOW (ref 8.9–10.3)
Chloride: 102 mmol/L (ref 101–111)
Creatinine, Ser: 5.17 mg/dL — ABNORMAL HIGH (ref 0.61–1.24)
GFR calc Af Amer: 12 mL/min — ABNORMAL LOW (ref >60)
GFR calc non Af Amer: 11 mL/min — ABNORMAL LOW (ref >60)
GLUCOSE: 97 mg/dL (ref 65–99)
POTASSIUM: 5.5 mmol/L — AB (ref 3.5–5.1)
SODIUM: 125 mmol/L — AB (ref 135–145)
TOTAL PROTEIN: 5.1 g/dL — AB (ref 6.5–8.1)
Total Bilirubin: 2.1 mg/dL — ABNORMAL HIGH (ref 0.3–1.2)

## 2015-12-31 LAB — CBC WITH DIFFERENTIAL/PLATELET
BASOS ABS: 0 10*3/uL (ref 0.0–0.1)
BASOS PCT: 0 %
Eosinophils Absolute: 0.1 10*3/uL (ref 0.0–0.7)
Eosinophils Relative: 1 %
HEMATOCRIT: 25 % — AB (ref 39.0–52.0)
HEMOGLOBIN: 8.1 g/dL — AB (ref 13.0–17.0)
LYMPHS PCT: 8 %
Lymphs Abs: 0.8 10*3/uL (ref 0.7–4.0)
MCH: 31 pg (ref 26.0–34.0)
MCHC: 32.4 g/dL (ref 30.0–36.0)
MCV: 95.8 fL (ref 78.0–100.0)
MONOS PCT: 6 %
Monocytes Absolute: 0.6 10*3/uL (ref 0.1–1.0)
NEUTROS ABS: 8.8 10*3/uL — AB (ref 1.7–7.7)
Neutrophils Relative %: 85 %
Platelets: 51 10*3/uL — ABNORMAL LOW (ref 150–400)
RBC: 2.61 MIL/uL — ABNORMAL LOW (ref 4.22–5.81)
RDW: 17.7 % — ABNORMAL HIGH (ref 11.5–15.5)
WBC: 10.3 10*3/uL (ref 4.0–10.5)

## 2015-12-31 LAB — URINE MICROSCOPIC-ADD ON

## 2015-12-31 LAB — BRAIN NATRIURETIC PEPTIDE: B NATRIURETIC PEPTIDE 5: 307 pg/mL — AB (ref 0.0–100.0)

## 2015-12-31 LAB — I-STAT CG4 LACTIC ACID, ED: LACTIC ACID, VENOUS: 2.23 mmol/L — AB (ref 0.5–1.9)

## 2015-12-31 LAB — AMMONIA: AMMONIA: 24 umol/L (ref 9–35)

## 2015-12-31 LAB — URINALYSIS, ROUTINE W REFLEX MICROSCOPIC
BILIRUBIN URINE: NEGATIVE
Glucose, UA: NEGATIVE mg/dL
Ketones, ur: 15 mg/dL — AB
NITRITE: NEGATIVE
PH: 5 (ref 5.0–8.0)
Protein, ur: 30 mg/dL — AB
SPECIFIC GRAVITY, URINE: 1.026 (ref 1.005–1.030)

## 2015-12-31 LAB — LIPASE, BLOOD: Lipase: 26 U/L (ref 11–51)

## 2015-12-31 MED ORDER — FAMOTIDINE 20 MG PO TABS: 20.0000 mg | ORAL_TABLET | Freq: Every day | ORAL | Status: DC | PRN

## 2015-12-31 MED ORDER — SIMETHICONE 80 MG PO CHEW
160.0000 mg | CHEWABLE_TABLET | Freq: Four times a day (QID) | ORAL | Status: DC | PRN
  Administered 2015-12-31: 160 mg via ORAL
  Filled 2015-12-31: qty 2

## 2015-12-31 MED ORDER — MIDODRINE HCL 5 MG PO TABS
10.0000 mg | ORAL_TABLET | Freq: Three times a day (TID) | ORAL | Status: DC
  Administered 2016-01-01 (×3): 10 mg via ORAL
  Filled 2015-12-31 (×3): qty 2

## 2015-12-31 MED ORDER — SODIUM BICARBONATE 8.4 % IV SOLN
INTRAVENOUS | Status: DC
  Administered 2015-12-31: 20:00:00 via INTRAVENOUS
  Filled 2015-12-31 (×2): qty 850

## 2015-12-31 MED ORDER — LOTEPREDNOL ETABONATE 0.5 % OP GEL: 1.0000 [drp] | OPHTHALMIC | Status: DC

## 2015-12-31 MED ORDER — CITALOPRAM HYDROBROMIDE 10 MG PO TABS
10.0000 mg | ORAL_TABLET | Freq: Every day | ORAL | Status: DC
  Administered 2015-12-31: 10 mg via ORAL
  Filled 2015-12-31: qty 1

## 2015-12-31 MED ORDER — LACTULOSE 10 GM/15ML PO SOLN
20.0000 g | Freq: Three times a day (TID) | ORAL | Status: DC
  Administered 2015-12-31 – 2016-01-01 (×3): 20 g via ORAL
  Filled 2015-12-31 (×3): qty 30

## 2015-12-31 MED ORDER — PANTOPRAZOLE SODIUM 40 MG PO TBEC
80.0000 mg | DELAYED_RELEASE_TABLET | Freq: Every day | ORAL | Status: DC
  Administered 2016-01-01: 80 mg via ORAL
  Filled 2015-12-31: qty 2

## 2015-12-31 MED ORDER — SODIUM CHLORIDE 0.9 % IV BOLUS (SEPSIS)
500.0000 mL | Freq: Once | INTRAVENOUS | Status: AC
  Administered 2015-12-31: 500 mL via INTRAVENOUS

## 2015-12-31 MED ORDER — ALBUMIN HUMAN 25 % IV SOLN
25.0000 g | Freq: Three times a day (TID) | INTRAVENOUS | Status: DC
  Administered 2016-01-01 (×3): 25 g via INTRAVENOUS
  Filled 2015-12-31 (×2): qty 50
  Filled 2015-12-31: qty 100
  Filled 2015-12-31: qty 50

## 2015-12-31 MED ORDER — RIFAXIMIN 550 MG PO TABS
550.0000 mg | ORAL_TABLET | Freq: Two times a day (BID) | ORAL | Status: DC
  Administered 2015-12-31 – 2016-01-01 (×2): 550 mg via ORAL
  Filled 2015-12-31 (×3): qty 1

## 2015-12-31 MED ORDER — LOTEPREDNOL ETABONATE 0.5 % OP SUSP
1.0000 [drp] | Freq: Four times a day (QID) | OPHTHALMIC | Status: DC
  Administered 2015-12-31 – 2016-01-01 (×4): 1 [drp] via OPHTHALMIC
  Filled 2015-12-31: qty 5

## 2015-12-31 MED ORDER — MIDODRINE HCL 5 MG PO TABS
5.0000 mg | ORAL_TABLET | Freq: Three times a day (TID) | ORAL | Status: DC
  Administered 2015-12-31: 5 mg via ORAL
  Filled 2015-12-31: qty 1

## 2015-12-31 MED ORDER — ADULT MULTIVITAMIN W/MINERALS CH
1.0000 | ORAL_TABLET | Freq: Every day | ORAL | Status: DC
  Administered 2016-01-01: 1 via ORAL
  Filled 2015-12-31: qty 1

## 2015-12-31 MED ORDER — HYDROXYZINE HCL 25 MG PO TABS: 25.0000 mg | ORAL_TABLET | Freq: Three times a day (TID) | ORAL | Status: DC | PRN

## 2015-12-31 MED ORDER — SODIUM BICARBONATE 650 MG PO TABS
1300.0000 mg | ORAL_TABLET | Freq: Three times a day (TID) | ORAL | Status: DC
  Administered 2015-12-31 – 2016-01-01 (×3): 1300 mg via ORAL
  Filled 2015-12-31 (×3): qty 2

## 2015-12-31 NOTE — ED Triage Notes (Signed)
The pt has kidney and liver failure and is followed at [facility] for poss kidney and liver transplant list.  He is c/o weakness today  Recent picc line removal.  His wife noticed that his lt chest and upper abd has bruising that is new  Some sob  With exertion

## 2015-12-31 NOTE — ED Triage Notes (Signed)
picc line removed from his lt upper arm  Due to [facility]

## 2015-12-31 NOTE — Consult Note (Signed)
Reason for Consult: AKI, CKD Referring Physician: Dr. [name] is an 65 y.o. male.  HPI: 65 yr male with hx CM, obesity, HTN, NASH with advanced cirrhosis.  Recurrent hosp for fluid xs, hepatic enceph, and complic of his liver dz.  Last hosp transferred to [facility] about 2 wk ago for consideration of liver TX.  Apparently felt weak and sent home for PT.  Last hosp had presented with S. Epi sepsis and was d/c on Vanco.  Was on until a few days ago and had trough of 42.  PICC pulled and resulted in large chest wall hematoma.  Recurrent Hepatic enceph, low ptlt, coagulopathic.  Baseline Cr 1.5-2.1 and now is 5.1.  No hematuria, flank pain but low urine output for several d.  No fevers, chills, N, V, but SOB with mild exertion.  No rash, sores, in eyes and no blood in stools.   Constitutional: weak, feels bad Eyes: negative Ears, nose, mouth, throat, and face: negative Respiratory: more SOB, no cough Cardiovascular: more edema , off diuretics Gastrointestinal: low urine vol Genitourinary:large bruise L side of chest Integument/breast: as above Hematologic/lymphatic: anemia, low ptlt, bruises Musculoskeletal:aches Neurological: iontermittent enceph and slow mentation Behavioral/Psych: as above Endocrine: DM Allergic/Immunologic: negative   Past Medical History:  Diagnosis Date  . Adenomatous colon polyp '01 & '08  . Arthritis    RA  " ALL OVER "  . Chronic diastolic congestive heart failure ([place])    a.  ECHO 01/13/14: EF 55-60%; moderate LVH, G2DD, biatrial enlargement; mild RVE; moderate MR; mildly elevated, pulm pressures.  . Chronic hepatitis ([place])    cirrhosis  . Cirrhosis ([place])    a. Due to NASH, followed by [facility].  . CKD (chronic kidney disease)   . Diabetes mellitus without complication ([place])    type two  . Dilatation of aorta (HCC)    a. Mild aortic root dilitation by echo 01/13/14  . Dysphagia    BaS/tab neg 8/09; egd's neg for  ring/strict--?dysmotility  . Family history of colon cancer    mother --53  . Frequent PVCs   . Hemorrhoids    Severe anorectal pain and anal fissure w bleeding following hemorrhoidectomy may 2010  . Hepatic encephalopathy ([place])   . Hypertension   . Morbid obesity ([place])   . NASH (nonalcoholic steatohepatitis) dx'd 2007  . OSA on CPAP   . Pancytopenia ([place])   . Pleural effusion 08/2015  . Screening    Hepatocellular Screening CT neg 05/2008, U/S neg 04/2009,05/2010  . Seasonal allergies   . Shortness of breath   . Splenomegaly    slt thrombocytopenia;no complics. egd 2/83 neg for varices, ?mild portal gastropathy; 02/2011 nl,novarices    Past Surgical History:  Procedure Laterality Date  . ANAL FISSURE REPAIR    . COLONOSCOPY WITH PROPOFOL N/A 03/11/2015   Procedure: COLONOSCOPY WITH PROPOFOL;  Surgeon: Ronald Lobo, MD;  Location: [facility] ENDOSCOPY;  Service: Endoscopy;  Laterality: N/A;  . ESOPHAGOGASTRODUODENOSCOPY (EGD) WITH PROPOFOL N/A 03/11/2015   Procedure: ESOPHAGOGASTRODUODENOSCOPY (EGD) WITH PROPOFOL;  Surgeon: Ronald Lobo, MD;  Location: [facility] ENDOSCOPY;  Service: Endoscopy;  Laterality: N/A;  . HEMORRHOID SURGERY      Family History  Problem Relation Age of Onset  . Cancer Mother     died of breast cancer  . Liver disease Father   . Thyroid disease Sister     Social History:  reports that he has never smoked. He has never used smokeless tobacco. He  reports that he drinks alcohol. He reports that he does not use drugs.  Allergies: No Known Allergies  Medications:  I have reviewed the patient's current medications. Prior to Admission:  Prescriptions Prior to Admission  Medication Sig Dispense Refill Last Dose  . acetaminophen (TYLENOL) 500 MG tablet Take 1,000 mg by mouth 2 (two) times daily as needed (pain).   12/31/2015 at am  . Cholecalciferol (VITAMIN D) 2000 units tablet Take 2,000 Units by mouth daily.   12/31/2015 at Unknown time  . citalopram (CELEXA) 10 MG  tablet Take 1 tablet by mouth at bedtime.    12/30/2015 at Unknown time  . esomeprazole (NEXIUM) 40 MG capsule Take 40 mg by mouth daily.    12/31/2015 at Unknown time  . famotidine (PEPCID) 20 MG tablet Take 20 mg by mouth daily as needed for heartburn or indigestion.   2-3 days ago  . hydrocortisone cream 1 % Apply topically 4 (four) times daily as needed (hemorrhoidal irritation). 30 g 0 few days ago  . hydrOXYzine (ATARAX/VISTARIL) 25 MG tablet Take 0.5 tablets (12.5 mg total) by mouth at bedtime as needed for anxiety (insomnia). (Patient taking differently: Take 25 mg by mouth every 8 (eight) hours as needed for itching. )   12/30/2015 at pm  . lactulose (CHRONULAC) 10 GM/15ML solution Take 45 mLs (30 g total) by mouth 3 (three) times daily. Please increase up to 40g three times a day if not achieving 4-5 BM daily or patient mentation is worsening. (Patient taking differently: Take 20 g by mouth See admin instructions. Take 30 mls (20 g) by mouth 3-4 times daily - to ensure at least 3 soft bowel movements per day) 473 mL 0 12/31/2015 at noon  . Loteprednol Etabonate (LOTEMAX) 0.5 % GEL Place 1 drop into both eyes See admin instructions. Instill 1 drop into both eyes 4 times daily for dry eyes - for 2 weeks starting 12/23/15   12/30/2015 at Unknown time  . metoprolol succinate (TOPROL-XL) 25 MG 24 hr tablet Take 25 mg by mouth daily.   12/31/2015 at 700  . midodrine (PROAMATINE) 5 MG tablet Take 5 mg by mouth 3 (three) times daily.   12/31/2015 at noon  . Multiple Vitamin (MULTIVITAMIN WITH MINERALS) TABS tablet Take 1 tablet by mouth daily. Centrum Silver   12/31/2015 at Unknown time  . PRESCRIPTION MEDICATION Inhale into the lungs at bedtime. CPAP   12/30/2015 at Unknown time  . rifaximin (XIFAXAN) 550 MG TABS tablet Take 1 tablet (550 mg total) by mouth 2 (two) times daily. 60 tablet 0 12/31/2015 at am  . simethicone (MYLICON) 80 MG chewable tablet Chew 1 tablet (80 mg total) by mouth 2 (two) times daily.  (Patient taking differently: Chew 160 mg by mouth 4 (four) times daily as needed for flatulence. )   few days ago  . zinc sulfate 220 MG capsule Take 220 mg by mouth daily.   12/31/2015 at Unknown time     Results for orders placed or performed during the hospital encounter of 12/31/15 (from the past 48 hour(s))  Comprehensive metabolic panel     Status: Abnormal   Collection Time: 12/31/15  4:26 PM  Result Value Ref Range   Sodium 125 (L) 135 - 145 mmol/L   Potassium 5.5 (H) 3.5 - 5.1 mmol/L   Chloride 102 101 - 111 mmol/L   CO2 14 (L) 22 - 32 mmol/L   Glucose, Bld 97 65 - 99 mg/dL   BUN 91 (H)  6 - 20 mg/dL   Creatinine, Ser 5.17 (H) 0.61 - 1.24 mg/dL   Calcium 8.1 (L) 8.9 - 10.3 mg/dL   Total Protein 5.1 (L) 6.5 - 8.1 g/dL   Albumin 2.9 (L) 3.5 - 5.0 g/dL   AST 38 15 - 41 U/L   ALT 20 17 - 63 U/L   Alkaline Phosphatase 138 (H) 38 - 126 U/L   Total Bilirubin 2.1 (H) 0.3 - 1.2 mg/dL   GFR calc non Af Amer 11 (L) >60 mL/min   GFR calc Af Amer 12 (L) >60 mL/min    Comment: (NOTE) The eGFR has been calculated using the CKD EPI equation. This calculation has not been validated in all clinical situations. eGFR's persistently <60 mL/min signify possible Chronic Kidney Disease.    Anion gap 9 5 - 15  CBC WITH DIFFERENTIAL     Status: Abnormal   Collection Time: 12/31/15  4:26 PM  Result Value Ref Range   WBC 10.3 4.0 - 10.5 K/uL   RBC 2.61 (L) 4.22 - 5.81 MIL/uL   Hemoglobin 8.1 (L) 13.0 - 17.0 g/dL   HCT 25.0 (L) 39.0 - 52.0 %   MCV 95.8 78.0 - 100.0 fL   MCH 31.0 26.0 - 34.0 pg   MCHC 32.4 30.0 - 36.0 g/dL   RDW 17.7 (H) 11.5 - 15.5 %   Platelets 51 (L) 150 - 400 K/uL    Comment: REPEATED TO VERIFY CONSISTENT WITH PREVIOUS RESULT    Neutrophils Relative % 85 %   Lymphocytes Relative 8 %   Monocytes Relative 6 %   Eosinophils Relative 1 %   Basophils Relative 0 %   Neutro Abs 8.8 (H) 1.7 - 7.7 K/uL   Lymphs Abs 0.8 0.7 - 4.0 K/uL   Monocytes Absolute 0.6 0.1 - 1.0 K/uL    Eosinophils Absolute 0.1 0.0 - 0.7 K/uL   Basophils Absolute 0.0 0.0 - 0.1 K/uL   RBC Morphology TARGET CELLS     Comment: ELLIPTOCYTES  Lipase, blood     Status: None   Collection Time: 12/31/15  4:26 PM  Result Value Ref Range   Lipase 26 11 - 51 U/L  Brain natriuretic peptide     Status: Abnormal   Collection Time: 12/31/15  4:26 PM  Result Value Ref Range   B Natriuretic Peptide 307.0 (H) 0.0 - 100.0 pg/mL  I-Stat CG4 Lactic Acid, ED  (not at  [facility])     Status: Abnormal   Collection Time: 12/31/15  4:46 PM  Result Value Ref Range   Lactic Acid, Venous 2.23 (HH) 0.5 - 1.9 mmol/L   Comment NOTIFIED PHYSICIAN   Ammonia     Status: None   Collection Time: 12/31/15  6:20 PM  Result Value Ref Range   Ammonia 24 9 - 35 umol/L  Protime-INR     Status: Abnormal   Collection Time: 12/31/15  8:30 PM  Result Value Ref Range   Prothrombin Time 22.6 (H) 11.4 - 15.2 seconds   INR 1.96   Urinalysis, Routine w reflex microscopic (not at [facility])     Status: Abnormal   Collection Time: 12/31/15  9:07 PM  Result Value Ref Range   Color, Urine AMBER (A) YELLOW    Comment: BIOCHEMICALS MAY BE AFFECTED BY COLOR   APPearance CLOUDY (A) CLEAR   Specific Gravity, Urine 1.026 1.005 - 1.030   pH 5.0 5.0 - 8.0   Glucose, UA NEGATIVE NEGATIVE mg/dL   Hgb urine dipstick SMALL (  A) NEGATIVE   Bilirubin Urine NEGATIVE NEGATIVE   Ketones, ur 15 (A) NEGATIVE mg/dL   Protein, ur 30 (A) NEGATIVE mg/dL   Nitrite NEGATIVE NEGATIVE   Leukocytes, UA MODERATE (A) NEGATIVE  Urine microscopic-add on     Status: Abnormal   Collection Time: 12/31/15  9:07 PM  Result Value Ref Range   Squamous Epithelial / LPF 0-5 (A) NONE SEEN   WBC, UA 6-30 0 - 5 WBC/hpf   RBC / HPF 0-5 0 - 5 RBC/hpf   Bacteria, UA RARE (A) NONE SEEN   Casts HYALINE CASTS (A) NEGATIVE    Comment: GRANULAR CAST   Urine-Other AMORPHOUS URATES/PHOSPHATES     Dg Chest Portable 1 View  Result Date: 12/31/2015 CLINICAL DATA:  End-stage renal  disease with increasing shortness of breath. MRIs today. EXAM: PORTABLE CHEST 1 VIEW COMPARISON:  One-view chest x-ray 12/14/2015. FINDINGS: Heart is enlarged. Moderate edema has increased. Right lower lobe effusion and airspace disease has progressed. No focal airspace disease is present on left. IMPRESSION: 1. Progressive congestive heart failure. 2. Increasing right pleural effusion associated airspace disease. While this may represent atelectasis, infection is also considered. Electronically Signed   By: San Morelle M.D.   On: 12/31/2015 17:56   ROS Blood pressure 100/61, pulse 64, temperature 99.2 F (37.3 C), resp. rate 18, height '6\' 2"'$  (1.88 m), weight (!) 148.4 kg (327 lb 2 oz), SpO2 96 %. Physical Exam Physical Examination: General appearance - obese , falls asleep, asterixis Mental status - oriented but clearly slow Eyes - pupils equal and reactive, extraocular eye movements intact, funduscopic exam normal, discs flat and sharp Ears - ceruminosis noted Mouth - mucous membranes moist, pharynx normal without lesions Neck - adenopathy noted PCL Lymphatics - posterior cervical nodes Chest - decreased bs, rales in bases Heart - S1 and S2 normal, systolic murmur IA1/6 at 2nd left intercostal space Abdomen - obese, pos bs, pos FW Musculoskeletal - no joint tenderness, deformity or swelling Extremities - pedal edema 3-4 +, DP 1+ Skin - pale, large bruise L side of chest , seb kerratoses, trophic changes in feet,   Assessment/Plan: 1 AKI Vanco vs HRS  About even chance of either. Need to sort out. Severe with GFR about 10 . Acidemic, mild ^ K, vol xs. Need to limit vol, tx as if HRS. 2 Cirrhosis enceph, asterixis, coag, ptlt . Very high MELD.  Needs tx soon or conservative mgmt 3 Hypertension: not an issue but does have bp 4. Anemia liver and kidne 5. Metabolic Bone Disease: check PTH 6 Obesity 7 DM 8 Enceph P UA, Urine Na/Cr, eos, stop ivf , po bicarb, follow K, SNa, Cr,  May  need RRT  Chantz [place] L 12/31/2015, 10:44 PM

## 2015-12-31 NOTE — ED Notes (Signed)
MD at bedside. 

## 2015-12-31 NOTE — H&P (Signed)
History and Physical    Peter Larson [id number] DOB: 1951-04-28 DOA: 12/31/2015   PCP: [name], MD Chief Complaint:  Chief Complaint  Patient presents with  . Weakness    HPI: Peter Larson is a 65 y.o. male with medical history significant of NASH, Hepatorenal syndrome, recent admit here for that earlier this month as well as, hepatic encephalopathy, coag neg staph bacteremia.  Got sent to [facility] from here for eval for liver and kidney dual transplant, discharged on the 17th.  They felt he was too weak and wanted to re-eval on Aug 9th.  Got discharged home on vanc.  Unfortunately vanc level was 42 on Tuesday and they stopped vanc at that time, patient started going down hill dramatically yesterday with essentially very minimal UOP.    ED Course: Now has creat of 5.1.  [facility] so [facility] would rather stay here than go to [facility].  Bicarb 14.    Review of Systems: As per HPI otherwise 10 point review of systems negative.    Past Medical History:  Diagnosis Date  . Adenomatous colon polyp '01 & '08  . Arthritis    RA  " ALL OVER "  . Chronic diastolic congestive heart failure ([place])    a.  ECHO 01/13/14: EF 55-60%; moderate LVH, G2DD, biatrial enlargement; mild RVE; moderate MR; mildly elevated, pulm pressures.  . Chronic hepatitis ([place])    cirrhosis  . Cirrhosis ([place])    a. Due to NASH, followed by [facility].  . CKD (chronic kidney disease)   . 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 Dilatation of aorta (HCC)    a. Mild aortic root dilitation by echo 01/13/14  . Dysphagia    BaS/tab neg 8/09; egd's neg for ring/strict--?dysmotility  . Family history of colon cancer    mother --43  . Frequent PVCs   . Hemorrhoids    Severe anorectal pain and anal fissure w bleeding following hemorrhoidectomy may 2010  . Hepatic encephalopathy ([place])   . Hypertension   . Morbid obesity ([place])   .  NASH (nonalcoholic steatohepatitis) dx'd 2007  . OSA on CPAP   . Pancytopenia ([place])   . Pleural effusion 08/2015  . Screening    Hepatocellular Screening CT neg 05/2008, U/S neg 04/2009,05/2010  . Seasonal allergies   . Shortness of breath   . Splenomegaly    slt thrombocytopenia;no complics. egd [id number] neg for varices, ?mild portal gastropathy; 02/2011 nl,[facility]    Past Surgical History:  Procedure Laterality Date  . ANAL FISSURE REPAIR    . COLONOSCOPY WITH PROPOFOL N/A 03/11/2015   Procedure: COLONOSCOPY WITH PROPOFOL;  Surgeon: Ronald Lobo, MD;  Location: [facility];  Service: Endoscopy;  Laterality: N/A;  . ESOPHAGOGASTRODUODENOSCOPY (EGD) WITH PROPOFOL N/A 03/11/2015   Procedure: ESOPHAGOGASTRODUODENOSCOPY (EGD) WITH PROPOFOL;  Surgeon: Ronald Lobo, MD;  Location: [facility];  Service: Endoscopy;  Laterality: N/A;  . HEMORRHOID SURGERY       reports that he has never smoked. He has never used smokeless tobacco. He reports that he drinks alcohol. He reports that he does not use drugs.  No Known Allergies  Family History  Problem Relation Age of Onset  . Cancer Mother     died of breast cancer  . Liver disease Father   . Thyroid disease Sister       Prior to Admission medications   Medication Sig Start Date  End Date Taking? Authorizing Provider  acetaminophen (TYLENOL) 500 MG tablet Take 1,000 mg by mouth 2 (two) times daily as needed (pain).   Yes Historical Provider, MD  Cholecalciferol (VITAMIN D) 2000 units tablet Take 2,000 Units by mouth daily.   Yes Historical Provider, MD  citalopram (CELEXA) 10 MG tablet Take 1 tablet by mouth at bedtime.  09/30/15 09/29/16 Yes Historical Provider, MD  esomeprazole (NEXIUM) 40 MG capsule Take 40 mg by mouth daily.    Yes Historical Provider, MD  famotidine (PEPCID) 20 MG tablet Take 20 mg by mouth daily as needed for heartburn or indigestion.   Yes Historical Provider, MD  hydrocortisone cream 1 % Apply topically 4 (four)  times daily as needed (hemorrhoidal irritation). 12/16/15  Yes Allie Bossier, MD  hydrOXYzine (ATARAX/VISTARIL) 25 MG tablet Take 0.5 tablets (12.5 mg total) by mouth at bedtime as needed for anxiety (insomnia). Patient taking differently: Take 25 mg by mouth every 8 (eight) hours as needed for itching.  11/27/15  Yes Barton Dubois, MD  lactulose (CHRONULAC) 10 GM/15ML solution Take 45 mLs (30 g total) by mouth 3 (three) times daily. Please increase up to 40g three times a day if not achieving 4-5 BM daily or patient mentation is worsening. Patient taking differently: Take 20 g by mouth See admin instructions. Take 30 mls (20 g) by mouth 3-4 times daily - to ensure at least 3 soft bowel movements per day 11/27/15  Yes Barton Dubois, MD  Loteprednol Etabonate (LOTEMAX) 0.5 % GEL Place 1 drop into both eyes See admin instructions. Instill 1 drop into both eyes 4 times daily for dry eyes - for 2 weeks starting 12/23/15   Yes Historical Provider, MD  metoprolol succinate (TOPROL-XL) 25 MG 24 hr tablet Take 25 mg by mouth daily. 12/23/15  Yes Historical Provider, MD  midodrine (PROAMATINE) 5 MG tablet Take 5 mg by mouth 3 (three) times daily. 12/29/15  Yes Historical Provider, MD  Multiple Vitamin (MULTIVITAMIN WITH MINERALS) TABS tablet Take 1 tablet by mouth daily. Centrum Silver   Yes Historical Provider, MD  PRESCRIPTION MEDICATION Inhale into the lungs at bedtime. CPAP   Yes Historical Provider, MD  rifaximin (XIFAXAN) 550 MG TABS tablet Take 1 tablet (550 mg total) by mouth 2 (two) times daily. 02/13/14  Yes Geradine Girt, DO  simethicone (MYLICON) 80 MG chewable tablet Chew 1 tablet (80 mg total) by mouth 2 (two) times daily. Patient taking differently: Chew 160 mg by mouth 4 (four) times daily as needed for flatulence.  07/03/15  Yes Modena Jansky, MD  zinc sulfate 220 MG capsule Take 220 mg by mouth daily. 08/17/15 08/16/16 Yes Historical Provider, MD    Physical Exam: Vitals:   12/31/15 1640  12/31/15 1830 12/31/15 1915 12/31/15 1930  BP:  116/55 (!) 125/52 124/61  Pulse:  60 63 62  Resp:  19 15 16   Temp:      SpO2:  100% 97% 98%  Weight: (!) 148.4 kg (327 lb 2 oz)     Height:          Constitutional: NAD, calm, comfortable Eyes: PERRL, lids and conjunctivae normal ENMT: Mucous membranes are moist. Posterior pharynx clear of any exudate or lesions.Normal dentition.  Neck: normal, supple, no masses, no thyromegaly Respiratory: clear to auscultation bilaterally, no wheezing, no crackles. Normal respiratory effort. No accessory muscle use.  Cardiovascular: Regular rate and rhythm, no murmurs / rubs / gallops. No extremity edema. 2+ pedal pulses. No carotid  bruits.  Abdomen: no tenderness, no masses palpated. No hepatosplenomegaly. Bowel sounds positive.  Musculoskeletal: no clubbing / cyanosis. No joint deformity upper and lower extremities. Good ROM, no contractures. Normal muscle tone.  Skin: no rashes, lesions, ulcers. No induration Neurologic: CN 2-12 grossly intact. Sensation intact, DTR normal. Strength 5/5 in all 4.  Psychiatric: Normal judgment and insight. Alert and oriented x 3. Normal mood.    Labs on Admission: I have personally reviewed following labs and imaging studies  CBC:  Recent Labs Lab 12/31/15 1626  WBC 10.3  NEUTROABS 8.8*  HGB 8.1*  HCT 25.0*  MCV 95.8  PLT 51*   Basic Metabolic Panel:  Recent Labs Lab 12/31/15 1626  NA 125*  K 5.5*  CL 102  CO2 14*  GLUCOSE 97  BUN 91*  CREATININE 5.17*  CALCIUM 8.1*   GFR: Estimated Creatinine Clearance: 22.2 mL/min (by C-G formula based on SCr of 5.17 mg/dL). Liver Function Tests:  Recent Labs Lab 12/31/15 1626  AST 38  ALT 20  ALKPHOS 138*  BILITOT 2.1*  PROT 5.1*  ALBUMIN 2.9*    Recent Labs Lab 12/31/15 1626  LIPASE 26    Recent Labs Lab 12/31/15 1820  AMMONIA 24   Coagulation Profile: No results for input(s): INR, PROTIME in the last 168 hours. Cardiac  Enzymes: No results for input(s): CKTOTAL, CKMB, CKMBINDEX, TROPONINI in the last 168 hours. BNP (last 3 results) No results for input(s): PROBNP in the last 8760 hours. HbA1C: No results for input(s): HGBA1C in the last 72 hours. CBG: No results for input(s): GLUCAP in the last 168 hours. Lipid Profile: No results for input(s): CHOL, HDL, LDLCALC, TRIG, CHOLHDL, LDLDIRECT in the last 72 hours. Thyroid Function Tests: No results for input(s): TSH, T4TOTAL, FREET4, T3FREE, THYROIDAB in the last 72 hours. Anemia Panel: No results for input(s): VITAMINB12, FOLATE, FERRITIN, TIBC, IRON, RETICCTPCT in the last 72 hours. Urine analysis:    Component Value Date/Time   COLORURINE AMBER (A) 12/14/2015 0420   APPEARANCEUR CLOUDY (A) 12/14/2015 0420   LABSPEC 1.025 12/14/2015 0420   PHURINE 5.0 12/14/2015 0420   GLUCOSEU NEGATIVE 12/14/2015 0420   HGBUR NEGATIVE 12/14/2015 0420   BILIRUBINUR NEGATIVE 12/14/2015 0420   KETONESUR 15 (A) 12/14/2015 0420   PROTEINUR NEGATIVE 12/14/2015 0420   UROBILINOGEN 1.0 02/23/2014 1710   NITRITE NEGATIVE 12/14/2015 0420   LEUKOCYTESUR NEGATIVE 12/14/2015 0420   Sepsis Labs: @LABRCNTIP (procalcitonin:4,lacticidven:4) )No results found for this or any previous visit (from the past 240 hour(s)).   Radiological Exams on Admission: Dg Chest Portable 1 View  Result Date: 12/31/2015 CLINICAL DATA:  End-stage renal disease with increasing shortness of breath. MRIs today. EXAM: PORTABLE CHEST 1 VIEW COMPARISON:  One-view chest x-ray 12/14/2015. FINDINGS: Heart is enlarged. Moderate edema has increased. Right lower lobe effusion and airspace disease has progressed. No focal airspace disease is present on left. IMPRESSION: 1. Progressive congestive heart failure. 2. Increasing right pleural effusion associated airspace disease. While this may represent atelectasis, infection is also considered. Electronically Signed   By: San Morelle M.D.   On: 12/31/2015  17:56   EKG: Independently reviewed.  Assessment/Plan Principal Problem:   Vancomycin-induced nephrotoxicity Active Problems:   Chronic diastolic congestive heart failure, NYHA class 1 (HCC)   CKD (chronic kidney disease), stage III   Liver cirrhosis secondary to NASH   AKI (acute kidney injury) (HCC)   Recurrent right pleural effusion   Thrombocytopenia (HCC)   Hepatorenal syndrome (HCC)   Acute kidney failure (  [place])    AKF on underlying CKD 3- most likely due to combination of vanc induced nephrotoxicity given vanc tough of 42 on Tuesday on top of known hepatorenal syndrome.  Creatinine was already up to 4 on Tuesday this week.  Spoke with Dr. Jimmy Footman, nephrology will see patient in AM  Spoke with patient and family, I am concerned that he wont do to well on dialysis, given that he is already on midodrine with ESLD at baseline before any of this.  Likely want to consider palliative care consultation.  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  (confirmed with Dr. Jimmy Footman)  ESLD -  Checking INR  Continue midodrine  Not on diuretics anymore already  Thrombocytopenia - chronic, does have new bruising due to this and INR elevation on abdomen now.  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  Transudate when tapped at [facility] a couple of weeks ago.     DVT prophylaxis: SCDs Code Status: Full for now Family Communication: Family at bedside Consults called: Spoke with Dr. Jimmy Footman, nephro to see patient in AM Admission status: Admit to inpatient   Etta Quill DO [facility] Pager [phone] from 7PM-7AM  If 7AM-7PM, please contact the day physician for the patient [email or web address] Password [email or web address]  12/31/2015, 8:40 PM

## 2015-12-31 NOTE — ED Provider Notes (Signed)
[facility] DEPT Provider Note   CSN: [id number] Arrival date & time: 12/31/15  1539  First Provider Contact:  None       History   Chief Complaint Chief Complaint  Patient presents with  . Weakness    HPI Peter Larson is a 65 y.o. male.  HPI  Patient with extensive past medical history including recent admission for acute hepatic encephalopathy and bacteremia. Recently had become removed and IV gentamicin stopped about 2 days ago. Presents today with 1 day of increasing fatigue. Denies fevers at home but they say he does seem short of breath. He also endorses worsening of his urine output over the past day. He denies abdominal pain nausea vomiting. They also complain of new rash on his left side.  Past Medical History:  Diagnosis Date  . Adenomatous colon polyp '01 & '08  . Arthritis    RA  " ALL OVER "  . Chronic diastolic congestive heart failure ([place])    a.  ECHO 01/13/14: EF 55-60%; moderate LVH, G2DD, biatrial enlargement; mild RVE; moderate MR; mildly elevated, pulm pressures.  . Chronic hepatitis ([place])    cirrhosis  . Cirrhosis ([place])    a. Due to NASH, followed by [facility].  . CKD (chronic kidney disease)   . 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 Dilatation of aorta (H[facility])    a. Mild aortic root dilitation by echo 01/13/14  . Dysphagia    BaS/tab neg 8/09; egd's neg for ring/strict--?dysmotility  . Family history of colon cancer    mother --79  . Frequent PVCs   . Hemorrhoids    Severe anorectal pain and anal fissure w bleeding following hemorrhoidectomy may 2010  . Hepatic encephalopathy ([place])   . Hypertension   . Morbid obesity ([place])   . NASH (nonalcoholic steatohepatitis) dx'd 2007  . OSA on CPAP   . Pancytopenia ([place])   . Pleural effusion 08/2015  . Screening    Hepatocellular Screening CT neg 05/2008, U/S neg 04/2009,05/2010  . Seasonal allergies   . Shortness of breath   . Splenomegaly    slt thrombocytopenia;no  complics. egd [id number] neg for varices, ?mild portal gastropathy; 02/2011 nl,[facility]    Patient Active Problem List   Diagnosis Date Noted  . 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 Acute kidney failure ([place]) 12/31/2015  . S/P thoracentesis   . Thrombocytopenia ([place]) 12/15/2015  . Coagulase negative Staphylococcus bacteremia 12/15/2015  . Recurrent right pleural effusion 12/14/2015  . Dehydration 12/14/2015  . SIRS (systemic inflammatory response syndrome) ([place]) 12/14/2015  . Hepatic encephalopathy ([place])   . Anemia, chronic disease 11/23/2015  . Insomnia 11/23/2015  . Depression 11/23/2015  . AKI (acute kidney injury) ([place]) 09/03/2015  . Watery diarrhea 09/03/2015  . Moderate mitral regurgitation 08/31/2015  . Pleural effusion on right 07/01/2015  . Acute hepatic encephalopathy ([place]) 02/23/2014  . CKD (chronic kidney disease), stage III   . Morbid obesity ([place])   . Frequent PVCs   . Liver cirrhosis secondary to NASH   . Chronic diastolic congestive heart failure, NYHA class 1 ([place]) 01/30/2014  . OSA (obstructive sleep apnea) 07/16/2013  . Essential hypertension, benign 07/16/2013  . Diabetes mellitus ([place]) 01/01/2013    Past Surgical History:  Procedure Laterality Date  . ANAL FISSURE REPAIR    . COLONOSCOPY WITH PROPOFOL N/A 03/11/2015   Procedure: COLONOSCOPY WITH PROPOFOL;  Surgeon: Ronald Lobo, MD;  Location: [facility] ENDOSCOPY;  Service: Endoscopy;  Laterality: N/A;  . ESOPHAGOGASTRODUODENOSCOPY (EGD) WITH PROPOFOL N/A 03/11/2015   Procedure: ESOPHAGOGASTRODUODENOSCOPY (EGD) WITH PROPOFOL;  Surgeon: Ronald Lobo, MD;  Location: [facility] ENDOSCOPY;  Service: Endoscopy;  Laterality: N/A;  . HEMORRHOID SURGERY         Home Medications    Prior to Admission medications   Medication Sig Start Date End Date Taking? Authorizing Provider  acetaminophen (TYLENOL) 500 MG tablet Take 1,000 mg by mouth 2 (two) times daily as needed  (pain).   Yes Historical Provider, MD  Cholecalciferol (VITAMIN D) 2000 units tablet Take 2,000 Units by mouth daily.   Yes Historical Provider, MD  citalopram (CELEXA) 10 MG tablet Take 1 tablet by mouth at bedtime.  09/30/15 09/29/16 Yes Historical Provider, MD  esomeprazole (NEXIUM) 40 MG capsule Take 40 mg by mouth daily.    Yes Historical Provider, MD  famotidine (PEPCID) 20 MG tablet Take 20 mg by mouth daily as needed for heartburn or indigestion.   Yes Historical Provider, MD  hydrocortisone cream 1 % Apply topically 4 (four) times daily as needed (hemorrhoidal irritation). 12/16/15  Yes Allie Bossier, MD  hydrOXYzine (ATARAX/VISTARIL) 25 MG tablet Take 0.5 tablets (12.5 mg total) by mouth at bedtime as needed for anxiety (insomnia). Patient taking differently: Take 25 mg by mouth every 8 (eight) hours as needed for itching.  11/27/15  Yes Barton Dubois, MD  lactulose (CHRONULAC) 10 GM/15ML solution Take 45 mLs (30 g total) by mouth 3 (three) times daily. Please increase up to 40g three times a day if not achieving 4-5 BM daily or patient mentation is worsening. Patient taking differently: Take 20 g by mouth See admin instructions. Take 30 mls (20 g) by mouth 3-4 times daily - to ensure at least 3 soft bowel movements per day 11/27/15  Yes Barton Dubois, MD  Loteprednol Etabonate (LOTEMAX) 0.5 % GEL Place 1 drop into both eyes See admin instructions. Instill 1 drop into both eyes 4 times daily for dry eyes - for 2 weeks starting 12/23/15   Yes Historical Provider, MD  metoprolol succinate (TOPROL-XL) 25 MG 24 hr tablet Take 25 mg by mouth daily. 12/23/15  Yes Historical Provider, MD  midodrine (PROAMATINE) 5 MG tablet Take 5 mg by mouth 3 (three) times daily. 12/29/15  Yes Historical Provider, MD  Multiple Vitamin (MULTIVITAMIN WITH MINERALS) TABS tablet Take 1 tablet by mouth daily. Centrum Silver   Yes Historical Provider, MD  PRESCRIPTION MEDICATION Inhale into the lungs at bedtime. CPAP   Yes  Historical Provider, MD  rifaximin (XIFAXAN) 550 MG TABS tablet Take 1 tablet (550 mg total) by mouth 2 (two) times daily. 02/13/14  Yes Geradine Girt, DO  simethicone (MYLICON) 80 MG chewable tablet Chew 1 tablet (80 mg total) by mouth 2 (two) times daily. Patient taking differently: Chew 160 mg by mouth 4 (four) times daily as needed for flatulence.  07/03/15  Yes Modena Jansky, MD  zinc sulfate 220 MG capsule Take 220 mg by mouth daily. 08/17/15 08/16/16 Yes Historical Provider, MD    Family History Family History  Problem Relation Age of Onset  . Cancer Mother     died of breast cancer  . Liver disease Father   . Thyroid disease Sister     Social History Social History  Substance Use Topics  . Smoking status: Never Smoker  . Smokeless tobacco: Never Used  . Alcohol use Yes     Comment: 11/23/2015  "none since 2007's dx  fatty liver disease"     Allergies   Review of patient's allergies indicates no known allergies.   Review of Systems Review of Systems  Constitutional: Positive for fatigue. Negative for chills and fever.  HENT: Negative for ear pain and sore throat.   Eyes: Negative for pain and visual disturbance.  Respiratory: Negative for cough and shortness of breath.   Cardiovascular: Negative for chest pain and palpitations.  Gastrointestinal: Negative for abdominal pain and vomiting.  Genitourinary: Negative for dysuria and hematuria.  Musculoskeletal: Negative for arthralgias and back pain.  Skin: Positive for rash. Negative for color change.  Neurological: Negative for seizures and syncope.  All other systems reviewed and are negative.    Physical Exam Updated Vital Signs BP 100/61   Pulse 64   Temp 99.2 F (37.3 C)   Resp 18   Ht 6\' 2"  (1.88 m)   Wt (!) 148.4 kg   SpO2 96%   BMI 42.00 kg/m   Physical Exam  Constitutional: He appears well-developed and well-nourished.  HENT:  Head: Normocephalic and atraumatic.  Eyes: Conjunctivae are normal.    Neck: Neck supple.  Cardiovascular: Normal rate and regular rhythm.   No murmur heard. Pulmonary/Chest: Effort normal and breath sounds normal. No respiratory distress.  Abdominal: Soft. There is no tenderness.  protuberant  Musculoskeletal: He exhibits no edema.  Neurological: He is alert.  Skin: Skin is warm and dry. Rash (red and ecchymotic left chest and abdomen) noted.  Psychiatric: He has a normal mood and affect.  Nursing note and vitals reviewed.    ED Treatments / Results  Labs (all labs ordered are listed, but only abnormal results are displayed) Labs Reviewed  COMPREHENSIVE METABOLIC PANEL - Abnormal; Notable for the following:       Result Value   Sodium 125 (*)    Potassium 5.5 (*)    CO2 14 (*)    BUN 91 (*)    Creatinine, Ser 5.17 (*)    Calcium 8.1 (*)    Total Protein 5.1 (*)    Albumin 2.9 (*)    Alkaline Phosphatase 138 (*)    Total Bilirubin 2.1 (*)    GFR calc non Af Amer 11 (*)    GFR calc Af Amer 12 (*)    All other components within normal limits  CBC WITH DIFFERENTIAL/PLATELET - Abnormal; Notable for the following:    RBC 2.61 (*)    Hemoglobin 8.1 (*)    HCT 25.0 (*)    RDW 17.7 (*)    Platelets 51 (*)    Neutro Abs 8.8 (*)    All other components within normal limits  URINALYSIS, ROUTINE W REFLEX MICROSCOPIC (NOT AT [facility]) - Abnormal; Notable for the following:    Color, Urine AMBER (*)    APPearance CLOUDY (*)    Hgb urine dipstick SMALL (*)    Ketones, ur 15 (*)    Protein, ur 30 (*)    Leukocytes, UA MODERATE (*)    All other components within normal limits  BRAIN NATRIURETIC PEPTIDE - Abnormal; Notable for the following:    B Natriuretic Peptide 307.0 (*)    All other components within normal limits  PROTIME-INR - Abnormal; Notable for the following:    Prothrombin Time 22.6 (*)    All other components within normal limits  URINE MICROSCOPIC-ADD ON - Abnormal; Notable for the following:    Squamous Epithelial / LPF 0-5 (*)     Bacteria, UA RARE (*)  Casts HYALINE CASTS (*)    All other components within normal limits  I-STAT CG4 LACTIC ACID, ED - Abnormal; Notable for the following:    Lactic Acid, Venous 2.23 (*)    All other components within normal limits  URINE CULTURE  CULTURE, BLOOD (ROUTINE X 2)  CULTURE, BLOOD (ROUTINE X 2)  LIPASE, BLOOD  AMMONIA  CBC  BASIC METABOLIC PANEL  I-STAT CG4 LACTIC ACID, ED  CYTOLOGY - NON PAP    EKG  EKG Interpretation None       Radiology Dg Chest Portable 1 View  Result Date: 12/31/2015 CLINICAL DATA:  End-stage renal disease with increasing shortness of breath. MRIs today. EXAM: PORTABLE CHEST 1 VIEW COMPARISON:  One-view chest x-ray 12/14/2015. FINDINGS: Heart is enlarged. Moderate edema has increased. Right lower lobe effusion and airspace disease has progressed. No focal airspace disease is present on left. IMPRESSION: 1. Progressive congestive heart failure. 2. Increasing right pleural effusion associated airspace disease. While this may represent atelectasis, infection is also considered. Electronically Signed   By: San Morelle M.D.   On: 12/31/2015 17:56   Procedures Procedures (including critical care time)  Medications Ordered in ED Medications  sodium bicarbonate 150 mEq in sterile water 1,000 mL infusion ( Intravenous New Bag/Given 12/31/15 2025)  citalopram (CELEXA) tablet 10 mg (10 mg Oral Given 12/31/15 2143)  pantoprazole (PROTONIX) EC tablet 80 mg (not administered)  lactulose (CHRONULAC) 10 GM/15ML solution 20 g (20 g Oral Given 12/31/15 2145)  hydrOXYzine (ATARAX/VISTARIL) tablet 25 mg (not administered)  famotidine (PEPCID) tablet 20 mg (not administered)  midodrine (PROAMATINE) tablet 5 mg (5 mg Oral Given 12/31/15 2143)  simethicone (MYLICON) chewable tablet 160 mg (160 mg Oral Given 12/31/15 2144)  rifaximin (XIFAXAN) tablet 550 mg (550 mg Oral Given 12/31/15 2143)  multivitamin with minerals tablet 1 tablet (not administered)    loteprednol (LOTEMAX) 0.5 % ophthalmic suspension 1 drop (1 drop Both Eyes Given 12/31/15 2145)  sodium chloride 0.9 % bolus 500 mL (0 mLs Intravenous Stopped 12/31/15 1829)     Initial Impression / Assessment and Plan / ED Course  I have reviewed the triage vital signs and the nursing notes.  Pertinent labs & imaging results that were available during my care of the patient were reviewed by me and considered in my medical decision making (see chart for details).  Clinical Course    Patient is stable upon assessment. Patient's recent hospital workup has also been assessed. Today patient appears to have worsening renal function consistent with hepatorenal syndrome. He also has an acidosis, likely renal tubular acidosis. This patient was started on bicarbonate. Chest x-ray did not show any infiltrates of just pneumonia. Patient had minimal urine output with cath so doubt obstructive cause of renal failure.  We considered transfer for admission to [facility] in this patient however dependent beds. Patient's ability hospitalist here.  Final Clinical Impressions(s) / ED Diagnoses   Final diagnoses:  None    New Prescriptions New Prescriptions   No medications on file     Levada Schilling, MD 12/31/15 2211    Carmin Muskrat, MD 01/02/16 [phone]

## 2016-01-01 ENCOUNTER — Inpatient Hospital Stay (HOSPITAL_COMMUNITY): Payer: 59

## 2016-01-01 ENCOUNTER — Encounter (HOSPITAL_COMMUNITY): Payer: Self-pay

## 2016-01-01 DIAGNOSIS — K767 Hepatorenal syndrome: Secondary | ICD-10-CM

## 2016-01-01 DIAGNOSIS — N144 Toxic nephropathy, not elsewhere classified: Secondary | ICD-10-CM

## 2016-01-01 DIAGNOSIS — K7581 Nonalcoholic steatohepatitis (NASH): Secondary | ICD-10-CM

## 2016-01-01 DIAGNOSIS — N179 Acute kidney failure, unspecified: Principal | ICD-10-CM

## 2016-01-01 DIAGNOSIS — D696 Thrombocytopenia, unspecified: Secondary | ICD-10-CM

## 2016-01-01 DIAGNOSIS — L8990 Pressure ulcer of unspecified site, unspecified stage: Secondary | ICD-10-CM | POA: Insufficient documentation

## 2016-01-01 DIAGNOSIS — N189 Chronic kidney disease, unspecified: Secondary | ICD-10-CM

## 2016-01-01 DIAGNOSIS — J90 Pleural effusion, not elsewhere classified: Secondary | ICD-10-CM

## 2016-01-01 LAB — DIFFERENTIAL
Basophils Absolute: 0 10*3/uL (ref 0.0–0.1)
Basophils Relative: 0 %
EOS ABS: 0.4 10*3/uL (ref 0.0–0.7)
EOS PCT: 4 %
LYMPHS ABS: 0.6 10*3/uL — AB (ref 0.7–4.0)
Lymphocytes Relative: 7 %
MONOS PCT: 14 %
Monocytes Absolute: 1.2 10*3/uL — ABNORMAL HIGH (ref 0.1–1.0)
Neutro Abs: 6.6 10*3/uL (ref 1.7–7.7)
Neutrophils Relative %: 76 %

## 2016-01-01 LAB — MAGNESIUM: MAGNESIUM: 3.5 mg/dL — AB (ref 1.7–2.4)

## 2016-01-01 LAB — COMPREHENSIVE METABOLIC PANEL
ALT: 20 U/L (ref 17–63)
AST: 32 U/L (ref 15–41)
Albumin: 2.9 g/dL — ABNORMAL LOW (ref 3.5–5.0)
Alkaline Phosphatase: 128 U/L — ABNORMAL HIGH (ref 38–126)
Anion gap: 10 (ref 5–15)
BUN: 93 mg/dL — ABNORMAL HIGH (ref 6–20)
CHLORIDE: 101 mmol/L (ref 101–111)
CO2: 15 mmol/L — AB (ref 22–32)
CREATININE: 5.28 mg/dL — AB (ref 0.61–1.24)
Calcium: 8.1 mg/dL — ABNORMAL LOW (ref 8.9–10.3)
GFR calc non Af Amer: 10 mL/min — ABNORMAL LOW (ref >60)
GFR, EST AFRICAN AMERICAN: 12 mL/min — AB (ref >60)
Glucose, Bld: 92 mg/dL (ref 65–99)
POTASSIUM: 5.1 mmol/L (ref 3.5–5.1)
SODIUM: 126 mmol/L — AB (ref 135–145)
Total Bilirubin: 1.9 mg/dL — ABNORMAL HIGH (ref 0.3–1.2)
Total Protein: 5 g/dL — ABNORMAL LOW (ref 6.5–8.1)

## 2016-01-01 LAB — CBC
HCT: 24.6 % — ABNORMAL LOW (ref 39.0–52.0)
HEMOGLOBIN: 8 g/dL — AB (ref 13.0–17.0)
MCH: 31.3 pg (ref 26.0–34.0)
MCHC: 32.5 g/dL (ref 30.0–36.0)
MCV: 96.1 fL (ref 78.0–100.0)
PLATELETS: 47 10*3/uL — AB (ref 150–400)
RBC: 2.56 MIL/uL — AB (ref 4.22–5.81)
RDW: 17.6 % — ABNORMAL HIGH (ref 11.5–15.5)
WBC: 8.7 10*3/uL (ref 4.0–10.5)

## 2016-01-01 LAB — CREATININE, URINE, RANDOM: Creatinine, Urine: 241.4 mg/dL

## 2016-01-01 LAB — VANCOMYCIN, TROUGH: VANCOMYCIN TR: 33 ug/mL — AB (ref 15–20)

## 2016-01-01 LAB — IRON AND TIBC
Iron: 32 ug/dL — ABNORMAL LOW (ref 45–182)
SATURATION RATIOS: 18 % (ref 17.9–39.5)
TIBC: 176 ug/dL — ABNORMAL LOW (ref 250–450)
UIBC: 144 ug/dL

## 2016-01-01 LAB — PHOSPHORUS: Phosphorus: 7.4 mg/dL — ABNORMAL HIGH (ref 2.5–4.6)

## 2016-01-01 LAB — LACTIC ACID, PLASMA
LACTIC ACID, VENOUS: 1.8 mmol/L (ref 0.5–1.9)
LACTIC ACID, VENOUS: 1.6 mmol/L (ref 0.5–1.9)

## 2016-01-01 LAB — MRSA PCR SCREENING: MRSA by PCR: NEGATIVE

## 2016-01-01 LAB — CK: CK TOTAL: 21 U/L — AB (ref 49–397)

## 2016-01-01 LAB — URIC ACID: URIC ACID, SERUM: 7.7 mg/dL — AB (ref 4.4–7.6)

## 2016-01-01 LAB — SODIUM, URINE, RANDOM: Sodium, Ur: <10 mmol/L

## 2016-01-01 MED ORDER — ALBUMIN HUMAN 25 % IV SOLN: 25.0000 g | Freq: Three times a day (TID) | INTRAVENOUS | Status: AC

## 2016-01-01 MED ORDER — MIDODRINE HCL 5 MG PO TABS: 10.0000 mg | ORAL_TABLET | Freq: Three times a day (TID) | ORAL | Status: AC

## 2016-01-01 MED ORDER — FERUMOXYTOL INJECTION 510 MG/17 ML
510.0000 mg | Freq: Once | INTRAVENOUS | Status: AC
  Administered 2016-01-01: 510 mg via INTRAVENOUS
  Filled 2016-01-01: qty 17

## 2016-01-01 MED ORDER — ENSURE COMPLETE PO LIQD: 237.0000 mL | Freq: Two times a day (BID) | ORAL | Status: AC

## 2016-01-01 MED ORDER — SODIUM BICARBONATE 650 MG PO TABS: 1300.0000 mg | ORAL_TABLET | Freq: Three times a day (TID) | ORAL | Status: AC

## 2016-01-01 MED ORDER — DEXTROSE 5 % IV SOLN
1.0000 g | INTRAVENOUS | Status: DC
  Administered 2016-01-01: 1 g via INTRAVENOUS
  Filled 2016-01-01: qty 10

## 2016-01-01 MED ORDER — CEFTRIAXONE SODIUM 1 G IJ SOLR: 1.0000 g | INTRAMUSCULAR | Status: AC

## 2016-01-01 NOTE — Discharge Summary (Signed)
Physician Discharge Summary  Peter Larson [id number] DOB: 12-03-1950 DOA: 12/31/2015  PCP: Peter Kroner, MD  Admit date: 12/31/2015 Discharge date: 01/01/2016  Time spent: 65 minutes  Recommendations for Outpatient Follow-up:  1. Patient will be transferred to [facility] for further evaluation and management.   Discharge Diagnoses:  Principal Problem:   Acute kidney injury superimposed on CKD [facility]) Active Problems:   Hepatorenal syndrome (HCC)   Vancomycin-induced nephrotoxicity   Chronic diastolic congestive heart failure, NYHA class 1 (HCC)   CKD (chronic kidney disease), stage III   Liver cirrhosis secondary to NASH   Recurrent right pleural effusion   Thrombocytopenia (HCC)   Acute kidney failure (HCC)   Pressure ulcer   Discharge Condition: Stable  Diet recommendation: Heart healthy/renal diet  Filed Weights   12/31/15 1621 12/31/15 1640 01/01/16 0341  Weight: (!) 145.2 kg (320 lb) (!) 148.4 kg (327 lb 2 oz) (!) 151.3 kg (333 lb 8 oz)    History of present illness:  Per Dr. Ashley Larson is a 65 y.o. male with medical history significant of NASH, Hepatorenal syndrome, recent admit at [facility] for that earlier this month as well as, hepatic encephalopathy, coag neg staph bacteremia.  Got sent to [facility] from here for eval for liver and kidney dual transplant, discharged on the 17th.  They felt he was too weak and wanted to re-eval on Aug 9th.  Got discharged home on vanc.  Unfortunately vanc level was 42 on Tuesday and they stopped vanc at that time, patient started going down hill dramatically, with essentially very minimal UOP one day prior to admission.    ED Course: Now has creat of 5.1.  [facility] so [facility] would rather stay here than go to [facility].  Bicarb 14.  Hospital Course:  #1 acute on chronic kidney disease stage III ( baseline creatinine 1.5-2.1)Likely secondary to  hepatorenal syndrome versus vancomycin-induced toxicity Likely secondary to hepatorenal syndrome versus vancomycin-induced nephrotoxicity as 4 days prior to admission patient was noted to have a vancomycin trough of 42 in the center of hepatorenal syndrome. Patient was noted to have a creatinine of for 4 days prior to admission. Renal function trending up such that on admission patient's creatinine was up to 5.1. Repeat vancomycin trough of 33 today 01/01/2016. Creatinine today is 5.28. Urine sodium less than 10. Urine creatinine 241.40. Urinalysis with moderate leukocytes negative nitrites 6030 WBCs 30 protein. Renal ultrasound negative for hydronephrosis or mass lesion or stone. Parenchymal thinning and increased echogenicity of both kidneys consistent with medical renal disease, findings compatible with advanced cirrhosis. Patient has been seen in consultation by nephrology who were recommending probable transfer to [facility] which was supposed to be already in place. Patient noted to be oliguric. Urine cultures pending. Urine eosinophils pending. Patient was started on bicarbonate tablets. Patient has also been placed on IV albumin 25 g every 8 hours per nephrology. Per nephrology if no significant improvement will likely need hemodialysis. Nephrology also recommended transfer to [facility] for further evaluation and management.  #2 urinary tract infection Urine cultures pending. Patient was maintained on IV Rocephin.  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 Patient was placed on bicarbonate tablets. Per nephrology.  #4 cirrhosis/asterixis Patient with end-stage liver disease was in the process of being assessed but [facility] for transplantation. Patient's INR is 1.96. Patient with a MELD score of 32. Patient with  worsening liver disease and likely with hepatorenal syndrome. Patient was maintained on lactulose, midrin, Xifaxan. Patient also started on IV albumin  every 8 hours per nephrology.  #5 anemia Hemoglobin currently at 8. Likely an anemia of chronic disease secondary to renal disease and liver disease. Follow H&H.  #6 thrombocytopenia Secondary to cirrhosis. Patient with no overt bleeding.   #7 hypertension BP remained stable.  #8 recurrent right pleural effusion Was a transudate when underwent thoracenthesis a couple of weeks ago. 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   Procedures:  Renal ultrasound 01/01/2016  Chest x-ray 12/31/2015  Consultations:  Nephrology: Dr. Jimmy Footman 12/31/2015  Discharge Exam: Vitals:   01/01/16 1345 01/01/16 1608  BP: (!) 125/59   Pulse: 70   Resp: 17   Temp:  97.9 F (36.6 C)    General: NAD.Positive asterixis Cardiovascular: RRR Respiratory: Diminished breath sounds the bases. Bibasilar crackles. Abdomen: Soft, positive hepatosplenomegaly, nontender, positive bowel sounds,mildly distended. Extremities: No clubbing no cyanosis. 2+ bilateral lower extremity edema.  Discharge Instructions   Discharge Instructions    Diet - low sodium heart healthy    Complete by:  As directed   Renal diet   Increase activity slowly    Complete by:  As directed     Current Discharge Medication List    START taking these medications   Details  albumin human 25 % bottle Inject 100 mLs (25 g total) into the vein every 8 (eight) hours. Qty: 50 mL    cefTRIAXone 1 g in dextrose 5 % 50 mL Inject 1 g into the vein daily.    feeding supplement, ENSURE COMPLETE, (ENSURE COMPLETE) LIQD Take 237 mLs by mouth 2 (two) times daily between meals.    sodium bicarbonate 650 MG tablet Take 2 tablets (1,300 mg total) by mouth 3 (three) times daily.      CONTINUE these medications which have CHANGED   Details  midodrine (PROAMATINE) 5 MG tablet Take 2 tablets (10 mg total) by mouth 3 (three) times daily with meals.      CONTINUE these medications which have NOT CHANGED   Details  acetaminophen  (TYLENOL) 500 MG tablet Take 1,000 mg by mouth 2 (two) times daily as needed (pain).    Cholecalciferol (VITAMIN D) 2000 units tablet Take 2,000 Units by mouth daily.    citalopram (CELEXA) 10 MG tablet Take 1 tablet by mouth at bedtime.     esomeprazole (NEXIUM) 40 MG capsule Take 40 mg by mouth daily.     famotidine (PEPCID) 20 MG tablet Take 20 mg by mouth daily as needed for heartburn or indigestion.    hydrocortisone cream 1 % Apply topically 4 (four) times daily as needed (hemorrhoidal irritation). Qty: 30 g, Refills: 0    hydrOXYzine (ATARAX/VISTARIL) 25 MG tablet Take 0.5 tablets (12.5 mg total) by mouth at bedtime as needed for anxiety (insomnia).    lactulose (CHRONULAC) 10 GM/15ML solution Take 45 mLs (30 g total) by mouth 3 (three) times daily. Please increase up to 40g three times a day if not achieving 4-5 BM daily or patient mentation is worsening. Qty: 473 mL, Refills: 0    Loteprednol Etabonate (LOTEMAX) 0.5 % GEL Place 1 drop into both eyes See admin instructions. Instill 1 drop into both eyes 4 times daily for dry eyes - for 2 weeks starting 12/23/15    Multiple Vitamin (MULTIVITAMIN WITH MINERALS) TABS tablet Take 1 tablet by mouth daily. Centrum Silver    PRESCRIPTION MEDICATION Inhale into the  lungs at bedtime. CPAP    rifaximin (XIFAXAN) 550 MG TABS tablet Take 1 tablet (550 mg total) by mouth 2 (two) times daily. Qty: 60 tablet, Refills: 0    simethicone (MYLICON) 80 MG chewable tablet Chew 1 tablet (80 mg total) by mouth 2 (two) times daily.    zinc sulfate 220 MG capsule Take 220 mg by mouth daily.      STOP taking these medications     metoprolol succinate (TOPROL-XL) 25 MG 24 hr tablet        No Known Allergies    The results of significant diagnostics from this hospitalization (including imaging, microbiology, ancillary and laboratory) are listed below for reference.    Significant Diagnostic Studies: Dg Chest 1 View  Result Date:  12/14/2015 CLINICAL DATA:  Status post thoracentesis on the right EXAM: CHEST 1 VIEW COMPARISON:  December 14, 2015 study obtained earlier in the day FINDINGS: No pneumothorax. Right pleural effusion is slightly smaller post thoracentesis. Moderate effusion remains on the right with right base atelectasis. Left lung is clear. Heart is mildly enlarged with pulmonary vascularity within normal limits. No adenopathy. No bone lesions. IMPRESSION: No pneumothorax. Persistent effusion on the right with right base atelectasis. Lungs elsewhere clear. Stable cardiac prominence. Electronically Signed   By: Lowella Grip III M.D.   On: 12/14/2015 11:46   Ct Head Wo Contrast  Result Date: 12/14/2015 CLINICAL DATA:  Worsening confusion. Recurrent RIGHT pleural effusion. History of hypertension diabetes, chronic kidney disease, cirrhosis. EXAM: CT HEAD WITHOUT CONTRAST TECHNIQUE: Contiguous axial images were obtained from the base of the skull through the vertex without intravenous contrast. COMPARISON:  CT HEAD November 23, 2015 FINDINGS: Mildly motion degraded examination. INTRACRANIAL CONTENTS: The ventricles and sulci are normal for age. No intraparenchymal hemorrhage, mass effect nor midline shift. No acute large vascular territory infarcts. No abnormal extra-axial fluid collections. Basal cisterns are patent. Minimal calcific atherosclerosis of the carotid siphons. ORBITS: The included ocular globes and orbital contents are non-suspicious. SINUSES: The mastoid aircells and included paranasal sinuses are well-aerated. SKULL/SOFT TISSUES: No skull fracture. No significant soft tissue swelling. IMPRESSION: Negative mildly motion degraded CT HEAD Electronically Signed   By: Elon Alas M.D.   On: 12/14/2015 04:02   US Renal  Result Date: 01/01/2016 CLINICAL DATA:  Acute kidney injury. Of note, ultrasound for "acute kidney injury "Was also performed 11/16/2015. Liver cirrhosis secondary to NASH. Chronic congestive heart  failure, class 1. EXAM: RENAL / URINARY TRACT ULTRASOUND COMPLETE COMPARISON:  Renal ultrasound 11/16/2015. FINDINGS: Right Kidney: Length: 13.0 cm, within normal limits. The renal parenchyma is thin and isoechoic to the index organ, the liver. No focal mass lesion is present. There are no stones. There is no hydronephrosis. Left Kidney: Length: 13.6 cm, within normal limits. The renal parenchyma is thin. It is isoechoic to the index organ, the spleen. No focal mass lesion is present. There are no stones. There is no hydronephrosis. Bladder: None visualized Moderate abdominal ascites is again noted about the liver. The liver margin is markedly nodular compatible with cirrhosis. The spleen is enlarged. IMPRESSION: 1. Parenchymal thinning and increased echogenicity of both kidneys without significant change. The findings are nonspecific, but commonly seen in the setting of medical renal disease. 2. No hydronephrosis, a mass lesion, or stone.  This is stable. 3. Findings compatible with advanced cirrhosis. Electronically Signed   By: San Morelle M.D.   On: 01/01/2016 12:02  Dg Chest Portable 1 View  Result Date: 12/31/2015 CLINICAL DATA:  End-stage renal disease with increasing shortness of breath. MRIs today. EXAM: PORTABLE CHEST 1 VIEW COMPARISON:  One-view chest x-ray 12/14/2015. FINDINGS: Heart is enlarged. Moderate edema has increased. Right lower lobe effusion and airspace disease has progressed. No focal airspace disease is present on left. IMPRESSION: 1. Progressive congestive heart failure. 2. Increasing right pleural effusion associated airspace disease. While this may represent atelectasis, infection is also considered. Electronically Signed   By: San Morelle M.D.   On: 12/31/2015 17:56  Dg Chest Portable 1 View  Result Date: 12/14/2015 CLINICAL DATA:  Gradually worsening altered mental status starting yesterday. History of recurrent right-sided pleural effusions. EXAM: PORTABLE  CHEST 1 VIEW COMPARISON:  11/23/2015 FINDINGS: Shallow inspiration. Large right pleural effusion for cyst. Atelectasis or infiltration in the right lung. Appearance is similar to previous study. Heart size is mildly enlarged. No pneumothorax. IMPRESSION: Persistent right pleural effusion with infiltration or atelectasis in the right lung. No significant improvement since previous study. Electronically Signed   By: Lucienne Capers M.D.   On: 12/14/2015 04:07   US Thoracentesis Asp Pleural Space W/img Guide  Result Date: 12/14/2015 INDICATION: NASH, cirrhosis, recurrent right pleural effusion. Request is made for diagnostic and therapeutic thoracentesis. EXAM: ULTRASOUND GUIDED diagnostic and therapeutic THORACENTESIS MEDICATIONS: 1% lidocaine. COMPLICATIONS: None immediate. PROCEDURE: An ultrasound guided thoracentesis was thoroughly discussed with the patient's wife and questions answered. The benefits, risks, alternatives and complications were also discussed. The patient's wife understands and wishes to proceed with the procedure. Written consent was obtained. Ultrasound was performed to localize and mark an adequate pocket of fluid in the right chest. The area was then prepped and draped in the normal sterile fashion. 1% Lidocaine was used for local anesthesia. Under ultrasound guidance a Safe-T-Centesis catheter was introduced. Thoracentesis was performed. The catheter was removed and a dressing applied. FINDINGS: A total of approximately 1.7 L of slightly cloudy yellow fluid was removed. Samples were sent to the laboratory as requested by the clinical team. IMPRESSION: Successful ultrasound guided right thoracentesis yielding 1.7 L of pleural fluid. The procedure was terminated at this time secondary to hypotension. The patient was alert and talking. He did not have any complaints. He was stable. The primary team was called to inform them of this finding. Read by: Saverio Danker, PA-C Electronically Signed    By: Sandi Mariscal M.D.   On: 12/14/2015 11:33    Microbiology: Recent Results (from the past 240 hour(s))  Blood culture (routine x 2)     Status: None (Preliminary result)   Collection Time: 12/31/15  6:04 PM  Result Value Ref Range Status   Specimen Description BLOOD RIGHT ANTECUBITAL  Final   Special Requests BOTTLES DRAWN AEROBIC AND ANAEROBIC 5CC  Final   Culture NO GROWTH < 24 HOURS  Final   Report Status PENDING  Incomplete  MRSA PCR Screening     Status: None   Collection Time: 12/31/15 10:57 PM  Result Value Ref Range Status   MRSA by PCR NEGATIVE NEGATIVE Final    Comment:        The GeneXpert MRSA Assay (FDA approved for NASAL specimens only), is one component of a comprehensive MRSA colonization surveillance program. It is not intended to diagnose MRSA infection nor to guide or monitor treatment for MRSA infections.   Blood culture (routine x 2)     Status: None (Preliminary result)   Collection Time: 12/31/15 11:00 PM  Result Value Ref Range Status   Specimen Description BLOOD LEFT ARM  Final   Special Requests AEROBIC BOTTLE ONLY 4ML  Final   Culture NO GROWTH < 12 HOURS  Final   Report Status PENDING  Incomplete     Labs: Basic Metabolic Panel:  Recent Labs Lab 12/31/15 1626 01/01/16 0317  NA 125* 126*  K 5.5* 5.1  CL 102 101  CO2 14* 15*  GLUCOSE 97 92  BUN 91* 93*  CREATININE 5.17* 5.28*  CALCIUM 8.1* 8.1*  MG  --  3.5*  PHOS  --  7.4*   Liver Function Tests:  Recent Labs Lab 12/31/15 1626 01/01/16 0317  AST 38 32  ALT 20 20  ALKPHOS 138* 128*  BILITOT 2.1* 1.9*  PROT 5.1* 5.0*  ALBUMIN 2.9* 2.9*    Recent Labs Lab 12/31/15 1626  LIPASE 26    Recent Labs Lab 12/31/15 1820  AMMONIA 24   CBC:  Recent Labs Lab 12/31/15 1626 01/01/16 0317  WBC 10.3 8.7  NEUTROABS 8.8* 6.6  HGB 8.1* 8.0*  HCT 25.0* 24.6*  MCV 95.8 96.1  PLT 51* 47*   Cardiac Enzymes:  Recent Labs Lab 01/01/16 0317  CKTOTAL 21*   BNP: BNP  (last 3 results)  Recent Labs  07/01/15 0414 09/06/15 0030 12/31/15 1626  BNP 89.0 36.1 307.0*    ProBNP (last 3 results) No results for input(s): PROBNP in the last 8760 hours.  CBG: No results for input(s): GLUCAP in the last 168 hours.     SignedIrine Seal MD.  [facility] 01/01/2016, 6:07 PM

## 2016-01-01 NOTE — Progress Notes (Signed)
PROGRESS NOTE    Peter Larson  [id number] DOB: April 27, 1951 DOA: 12/31/2015 PCP: [name], MD    Brief Narrative:  Peter Larson is a 65 y.o. male with medical history significant of NASH, Hepatorenal syndrome, recent admit here for that earlier this month as well as, hepatic encephalopathy, coag neg staph bacteremia.  Got sent to [facility] from here for eval for liver and kidney dual transplant, discharged on the 17th.  They felt he was too weak and wanted to re-eval on Aug 9th.  Got discharged home on vanc.  Unfortunately vanc level was 42 on Tuesday and they stopped vanc at that time, patient started going down hill dramatically yesterday with essentially very minimal UOP.    ED Course: Now has creat of 5.1.  [facility] so [facility] would rather stay here than go to [place].  Bicarb 14.   Assessment & Plan:   Principal Problem:   Acute kidney injury superimposed on CKD ([place]) Active Problems:   Hepatorenal syndrome (HCC)   Vancomycin-induced nephrotoxicity   Chronic diastolic congestive heart failure, NYHA class 1 (HCC)   CKD (chronic kidney disease), stage III   Liver cirrhosis secondary to NASH   Recurrent right pleural effusion   Thrombocytopenia (HCC)   Acute kidney failure (HCC)   Pressure ulcer   #1 acute on chronic kidney disease stage III ( baseline creatinine 1.5-2.1) likely multifactorial secondary to hepatorenal syndrome versus vancomycin-induced toxicity Likely secondary to hepatorenal syndrome versus vancomycin-induced nephrotoxicity as 4 days prior to admission patient was noted to have a vancomycin trough of 42 in the center of hepatorenal syndrome. Patient was noted to have a creatinine of for 4 days prior to admission. Renal function trending up such that on admission patient's creatinine was up to 5.1. Repeat vancomycin trough of 33 today 01/01/2016. Creatinine today is 5.28. Urine sodium less than 10. Urine  creatinine 241.40. Urinalysis with moderate leukocytes negative nitrites 6030 WBCs 30 protein. Renal ultrasound negative for hydronephrosis or mass lesion or stone. Parenchymal thinning and increased echogenicity of both kidneys consistent with medical renal disease, findings compatible with advanced cirrhosis. Patient has been seen in consultation by nephrology who were recommending probable transfer to [facility] which was supposed to be already in place. Patient noted to be oliguric. Urine cultures pending. Urine eosinophils pending. Continue current bicarbonate tablets. Patient has also been placed on IV albumin 25 g every 8 hours per nephrology. Per nephrology of no significant improvement will likely need hemodialysis.   #2 urinary tract infection Urine cultures pending. IV Rocephin.  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 Patient was placed on bicarbonate tablets. Per nephrology.  #4 cirrhosis/asterixis Patient with end-stage liver disease was in the process of being assessed but [facility] for transplantation. Patient's INR is 1.96. Patient with a MELD score of 32. Patient with worsening liver disease and likely with hepatorenal syndrome. Continue on lactulose, midrin, Xifaxan.  #5 anemia Hemoglobin currently at 8. Likely an anemia of chronic disease secondary to renal disease and liver disease. Follow H&H.  #6 thrombocytopenia Secondary to cirrhosis. Patient with no overt bleeding.   #7 hypertension BP remained stable.  #8 recurrent right pleural effusion Was a transudate when underwent thoracenthesis a couple of weeks ago. 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     DVT prophylaxis: SCDs Code Status: Full Family Communication: Updated patient and wife at bedside. Disposition Plan: Transfer to [facility] when  bed available.   Consultants:   Nephrology: Dr. Jimmy Footman 12/31/2015  Procedures:   Renal ultrasound  01/01/2016  Chest x-ray 12/31/2015  Antimicrobials:   IV Rocephin 01/01/2016   Subjective: Patient states feels a little better than on admission. Patient denies any significant shortness of breath. No chest pain.  Objective: Vitals:   01/01/16 0341 01/01/16 0420 01/01/16 0820 01/01/16 1608  BP:  (!) 106/50    Pulse:  (!) 56    Resp:  18    Temp:  97.7 F (36.5 C) 97.6 F (36.4 C) 97.9 F (36.6 C)  TempSrc:  Oral Oral Oral  SpO2:  95%    Weight: (!) 151.3 kg (333 lb 8 oz)     Height:        Intake/Output Summary (Last 24 hours) at 01/01/16 1656 Last data filed at 01/01/16 1009  Gross per 24 hour  Intake              820 ml  Output               70 ml  Net              750 ml   Filed Weights   12/31/15 1621 12/31/15 1640 01/01/16 0341  Weight: (!) 145.2 kg (320 lb) (!) 148.4 kg (327 lb 2 oz) (!) 151.3 kg (333 lb 8 oz)    Examination:  General exam: Appears calm and comfortable.Asterixis Respiratory system: Decreased breath sounds in the bases. Some bibasilar crackles.  Cardiovascular system: S1 & S2 heard, RRR. No JVD, murmurs, rubs, gallops or clicks. 2+ bilateral lower extremity edema Gastrointestinal system: Abdomen is nontender, positive hepatosplenomegaly, positive bowel sounds. Central nervous system: Alert and oriented. No focal neurological deficits. Extremities: Symmetric 5 x 5 power. Skin: Left-sided hematoma on the chest wall. Psychiatry: Judgement and insight appear normal. Mood & affect appropriate.     Data Reviewed: I have personally reviewed following labs and imaging studies  CBC:  Recent Labs Lab 12/31/15 1626 01/01/16 0317  WBC 10.3 8.7  NEUTROABS 8.8* 6.6  HGB 8.1* 8.0*  HCT 25.0* 24.6*  MCV 95.8 96.1  PLT 51* 47*   Basic Metabolic Panel:  Recent Labs Lab 12/31/15 1626 01/01/16 0317  NA 125* 126*  K 5.5* 5.1  CL 102 101  CO2 14* 15*  GLUCOSE 97 92  BUN 91* 93*  CREATININE 5.17* 5.28*  CALCIUM 8.1* 8.1*  MG  --  3.5*    PHOS  --  7.4*   GFR: Estimated Creatinine Clearance: 22 mL/min (by C-G formula based on SCr of 5.28 mg/dL). Liver Function Tests:  Recent Labs Lab 12/31/15 1626 01/01/16 0317  AST 38 32  ALT 20 20  ALKPHOS 138* 128*  BILITOT 2.1* 1.9*  PROT 5.1* 5.0*  ALBUMIN 2.9* 2.9*    Recent Labs Lab 12/31/15 1626  LIPASE 26    Recent Labs Lab 12/31/15 1820  AMMONIA 24   Coagulation Profile:  Recent Labs Lab 12/31/15 2030  INR 1.96   Cardiac Enzymes:  Recent Labs Lab 01/01/16 0317  CKTOTAL 21*   BNP (last 3 results) No results for input(s): PROBNP in the last 8760 hours. HbA1C: No results for input(s): HGBA1C in the last 72 hours. CBG: No results for input(s): GLUCAP in the last 168 hours. Lipid Profile: No results for input(s): CHOL, HDL, LDLCALC, TRIG, CHOLHDL, LDLDIRECT in the last 72 hours. Thyroid Function Tests: No results for input(s): TSH, T4TOTAL, FREET4, T3FREE, THYROIDAB in the last  72 hours. Anemia Panel:  Recent Labs  01/01/16 0317  TIBC 176*  IRON 32*   Sepsis Labs:  Recent Labs Lab 12/31/15 1646 01/01/16 0510 01/01/16 0755  LATICACIDVEN 2.23* 1.8 1.6    Recent Results (from the past 240 hour(s))  Blood culture (routine x 2)     Status: None (Preliminary result)   Collection Time: 12/31/15  6:04 PM  Result Value Ref Range Status   Specimen Description BLOOD RIGHT ANTECUBITAL  Final   Special Requests BOTTLES DRAWN AEROBIC AND ANAEROBIC 5CC  Final   Culture NO GROWTH < 24 HOURS  Final   Report Status PENDING  Incomplete  MRSA PCR Screening     Status: None   Collection Time: 12/31/15 10:57 PM  Result Value Ref Range Status   MRSA by PCR NEGATIVE NEGATIVE Final    Comment:        The GeneXpert MRSA Assay (FDA approved for NASAL specimens only), is one component of a comprehensive MRSA colonization surveillance program. It is not intended to diagnose MRSA infection nor to guide or monitor treatment for MRSA infections.    Blood culture (routine x 2)     Status: None (Preliminary result)   Collection Time: 12/31/15 11:00 PM  Result Value Ref Range Status   Specimen Description BLOOD LEFT ARM  Final   Special Requests AEROBIC BOTTLE ONLY 4ML  Final   Culture NO GROWTH < 12 HOURS  Final   Report Status PENDING  Incomplete         Radiology Studies: US Renal  Result Date: 01/01/2016 CLINICAL DATA:  Acute kidney injury. Of note, ultrasound for "acute kidney injury "Was also performed 11/16/2015. Liver cirrhosis secondary to NASH. Chronic congestive heart failure, class 1. EXAM: RENAL / URINARY TRACT ULTRASOUND COMPLETE COMPARISON:  Renal ultrasound 11/16/2015. FINDINGS: Right Kidney: Length: 13.0 cm, within normal limits. The renal parenchyma is thin and isoechoic to the index organ, the liver. No focal mass lesion is present. There are no stones. There is no hydronephrosis. Left Kidney: Length: 13.6 cm, within normal limits. The renal parenchyma is thin. It is isoechoic to the index organ, the spleen. No focal mass lesion is present. There are no stones. There is no hydronephrosis. Bladder: None visualized Moderate abdominal ascites is again noted about the liver. The liver margin is markedly nodular compatible with cirrhosis. The spleen is enlarged. IMPRESSION: 1. Parenchymal thinning and increased echogenicity of both kidneys without significant change. The findings are nonspecific, but commonly seen in the setting of medical renal disease. 2. No hydronephrosis, a mass lesion, or stone.  This is stable. 3. Findings compatible with advanced cirrhosis. Electronically Signed   By: San Morelle M.D.   On: 01/01/2016 12:02  Dg Chest Portable 1 View  Result Date: 12/31/2015 CLINICAL DATA:  End-stage renal disease with increasing shortness of breath. MRIs today. EXAM: PORTABLE CHEST 1 VIEW COMPARISON:  One-view chest x-ray 12/14/2015. FINDINGS: Heart is enlarged. Moderate edema has increased. Right lower lobe  effusion and airspace disease has progressed. No focal airspace disease is present on left. IMPRESSION: 1. Progressive congestive heart failure. 2. Increasing right pleural effusion associated airspace disease. While this may represent atelectasis, infection is also considered. Electronically Signed   By: San Morelle M.D.   On: 12/31/2015 17:56       Scheduled Meds: . albumin human  25 g Intravenous Q8H  . cefTRIAXone (ROCEPHIN)  IV  1 g Intravenous Q24H  . citalopram  10 mg Oral QHS  .  lactulose  20 g Oral TID  . loteprednol  1 drop Both Eyes QID  . midodrine  10 mg Oral TID WC  . multivitamin with minerals  1 tablet Oral Daily  . pantoprazole  80 mg Oral Q1200  . rifaximin  550 mg Oral BID  . sodium bicarbonate  1,300 mg Oral TID   Continuous Infusions:    LOS: 1 day    Time spent: [place], MD [facility] Pager [phone]  If 7PM-7AM, please contact night-coverage [email or web address] Password [facility] 01/01/2016, 4:56 PM

## 2016-01-01 NOTE — Progress Notes (Signed)
Subjective: Interval History: has complaints sleepy. Slurred speech.  Objective: Vital signs in last 24 hours: Temp:  [97.7 F (36.5 C)-99.2 F (37.3 C)] 97.7 F (36.5 C) (07/29 0420) Pulse Rate:  [56-66] 56 (07/29 0420) Resp:  [15-19] 18 (07/29 0420) BP: (95-125)/(47-68) 106/50 (07/29 0420) SpO2:  [95 %-100 %] 95 % (07/29 0420) Weight:  [145.2 kg (320 lb)-151.3 kg (333 lb 8 oz)] 151.3 kg (333 lb 8 oz) (07/29 0341) Weight change:   Intake/Output from previous day: 07/28 0701 - 07/29 0700 In: 580 [P.O.:480; IV Piggyback:100] Out: 70 [Urine:70] Intake/Output this shift: No intake/output data recorded.  General appearance: cooperative, morbidly obese, pale and slurred speech Resp: diminished breath sounds bilaterally and rales bibasilar Cardio: S1, S2 normal and systolic murmur: holosystolic 2/6, blowing at apex GI: obese, pos bs, liver down 6 cm , pos FW, distended Extremities: edema 3-4+, including pre sacral  Lab Results:  Recent Labs  12/31/15 1626 01/01/16 0317  WBC 10.3 8.7  HGB 8.1* 8.0*  HCT 25.0* 24.6*  PLT 51* 47*   BMET:  Recent Labs  12/31/15 1626 01/01/16 0317  NA 125* 126*  K 5.5* 5.1  CL 102 101  CO2 14* 15*  GLUCOSE 97 92  BUN 91* 93*  CREATININE 5.17* 5.28*  CALCIUM 8.1* 8.1*   No results for input(s): PTH in the last 72 hours. Iron Studies:  Recent Labs  01/01/16 0317  IRON 32*  TIBC 176*    Studies/Results: Dg Chest Portable 1 View  Result Date: 12/31/2015 CLINICAL DATA:  End-stage renal disease with increasing shortness of breath. MRIs today. EXAM: PORTABLE CHEST 1 VIEW COMPARISON:  One-view chest x-ray 12/14/2015. FINDINGS: Heart is enlarged. Moderate edema has increased. Right lower lobe effusion and airspace disease has progressed. No focal airspace disease is present on left. IMPRESSION: 1. Progressive congestive heart failure. 2. Increasing right pleural effusion associated airspace disease. While this may represent atelectasis,  infection is also considered. Electronically Signed   By: San Morelle M.D.   On: 12/31/2015 17:56   I have reviewed the patient's current medications.  Assessment/Plan: 1 AKI Cr ^ mild, on bicarb and level rising.  Oliguric. Has pyuria, ? Drug rx vs infx . Culture pending, eos P.  Urine chem not done??. vol xs, acidemia ,hgh solute load, will need dialysis soon if not better.   2 Anemia Fe low bolus 3 cirrhosis/NASH  Mild enceph 4 Obesity 5 Low SNa liver dz with ^ water reabsorp 6 Hx recent staph sepsis P await labs, cont diet, bicarb, fluid restrict.  IV fe    LOS: 1 day   Peter Larson 01/01/2016,7:40 AM

## 2016-01-01 NOTE — Progress Notes (Signed)
CRITICAL VALUE ALERT  Critical value received:  Vanc Trough 33  Date of notification:  01/01/16  Time of notification:  0932  Critical value read back:YES  Nurse who received alert:  Leeanne Deed RN   MD notified (1st page):  Grand[place] Silos  Time of first page:  0932  MD notified (2nd page):  Time of second page:  Responding MD:  Grandville Silos  Time MD responded:  [phone]

## 2016-01-01 NOTE — Progress Notes (Signed)
CRITICAL VALUE ALERT  Critical value received:  Results for Peter Larson, Peter Larson (MRN [id number]) as of 01/01/2016 16:47  Ref. Range 01/01/2016 13:51  Sodium, Urine Latest Units: mmol/L <10  Creatinine, Urine Latest Units: mg/dL 241.40    Date of notification:  01/01/16  Time of notification:  [id number]  Critical value read back:Yes  Nurse who received alert:  Noa Galvao  MD notified (1st page):  [place]  Time of first page:  1351  MD notified (2nd page):  Time of second page:  Responding MD:  Jonnie Finner  Time MD responded:  1351

## 2016-01-02 LAB — URINE CULTURE: CULTURE: NO GROWTH

## 2016-01-02 LAB — PARATHYROID HORMONE, INTACT (NO CA): PTH: 26 pg/mL (ref 15–65)

## 2016-01-05 LAB — CULTURE, BLOOD (ROUTINE X 2)
Culture: NO GROWTH
Culture: NO GROWTH

## 2016-02-04 DEATH — deceased

## 2017-01-26 IMAGING — CT CT HEAD W/O CM
4 series · 17 of 47 positions shown, 19 images · non-contrast
Comparison: CT HEAD November 23, 2015

CLINICAL DATA: Worsening confusion. Recurrent RIGHT pleural
effusion. History of hypertension diabetes, chronic kidney disease,
cirrhosis.

EXAM:
CT HEAD WITHOUT CONTRAST
TECHNIQUE: Contiguous axial images were obtained from the base of the skull
through the vertex without intravenous contrast.

[Series 2: head without · axial · non-contrast · 0.46mm/px · z∈[-141,-6]mm · 7 of 37 slices shown, 9 images]
[im 5/37  brain]
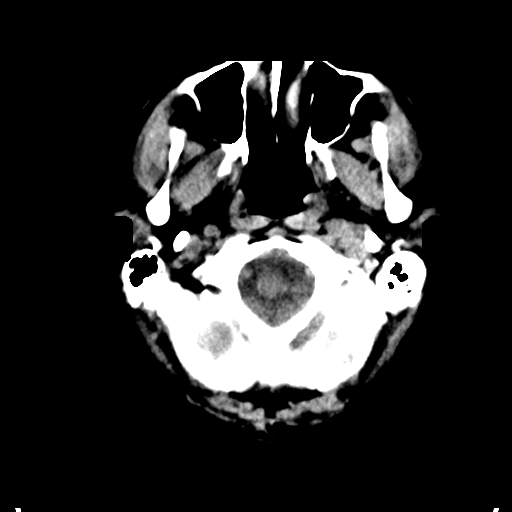
[im 5/37  bone]
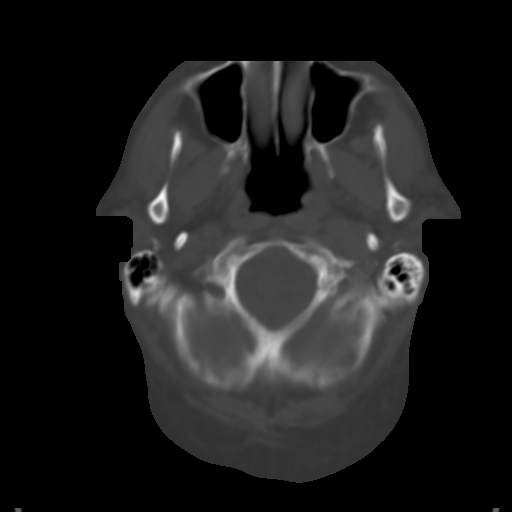
[im 10/37  brain]
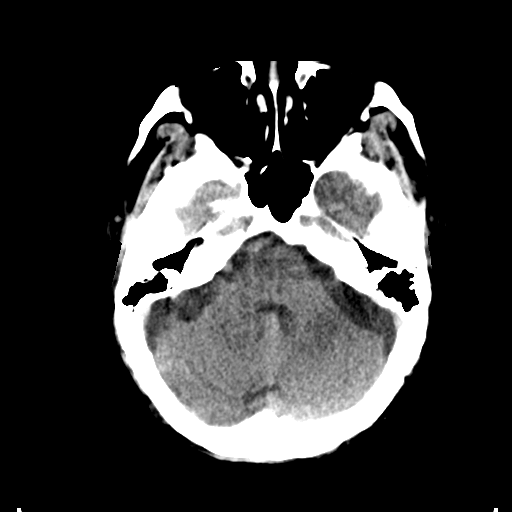
[im 14/37  brain]
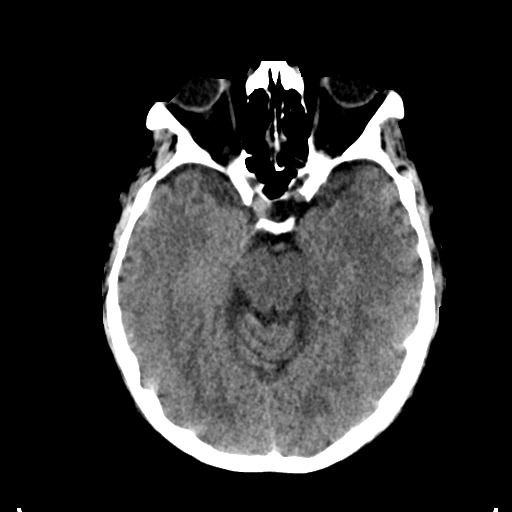
[im 19/37  brain]
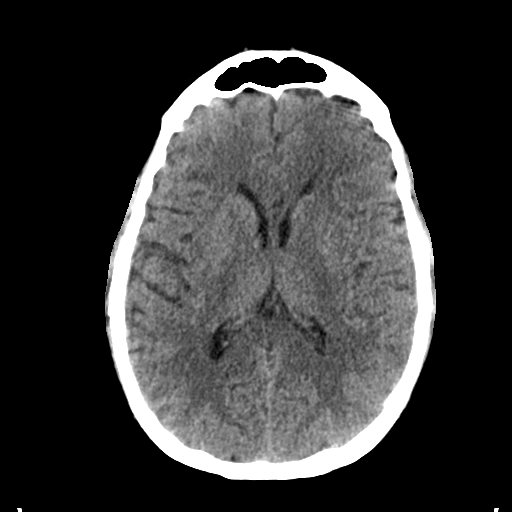
[im 23/37  brain]
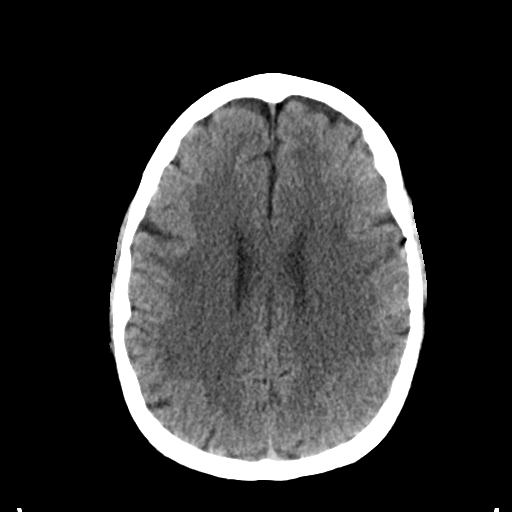
[im 23/37  bone]
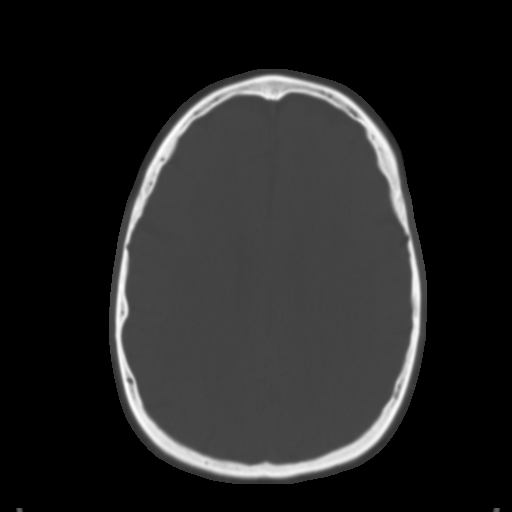
[im 28/37  brain]
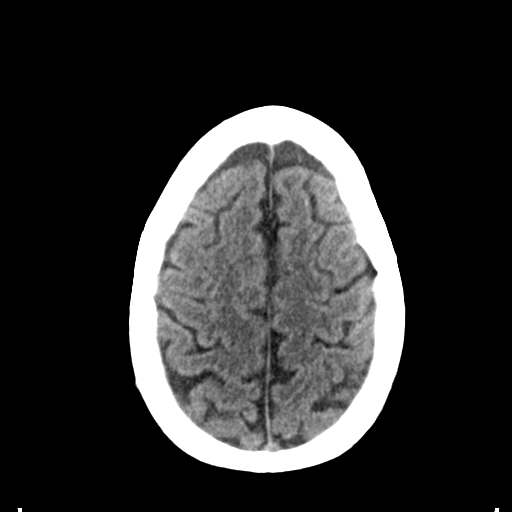
[im 32/37  brain]
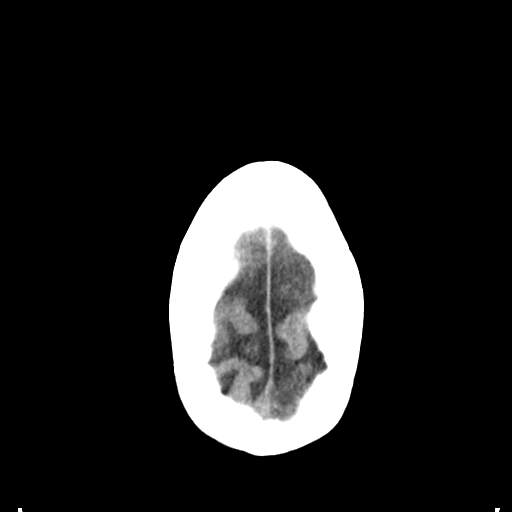

[Series 3: head bone · axial · 0.46mm/px · z∈[-139,-75]mm · 4 of 93 slices shown]
[im 10/93  bone]
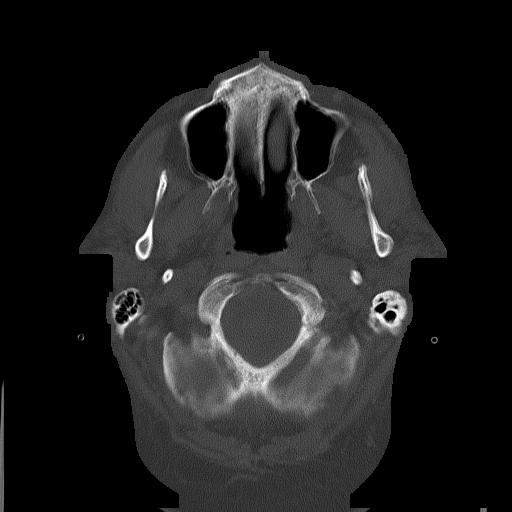
[im 19/93  bone]
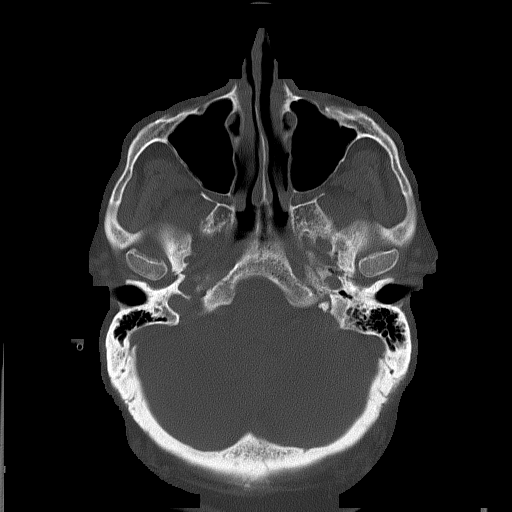
[im 28/93  bone]
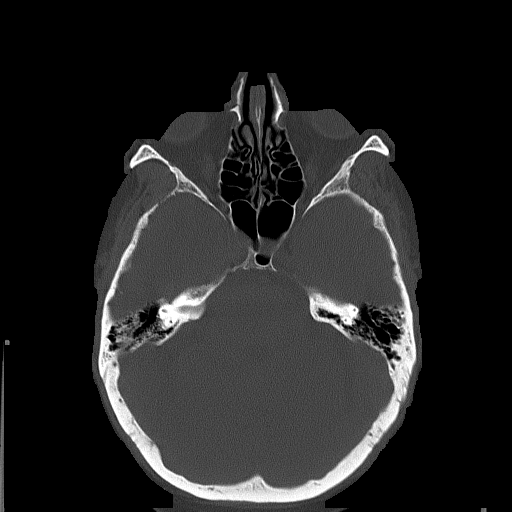
[im 42/93  bone]
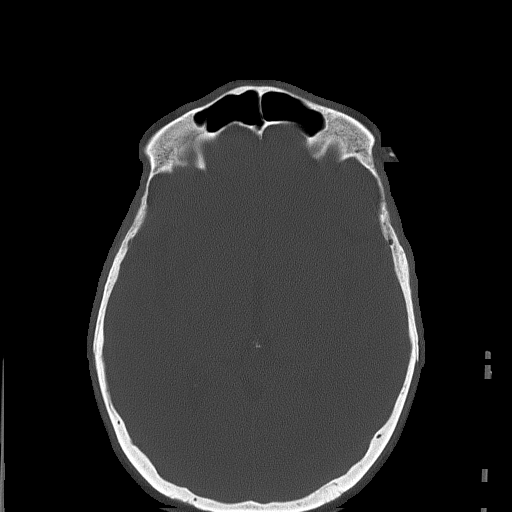

[Series 4: head without cor · coronal · non-contrast · 0.38mm/px · 3 of 77 slices shown]
[im 26/77  brain]
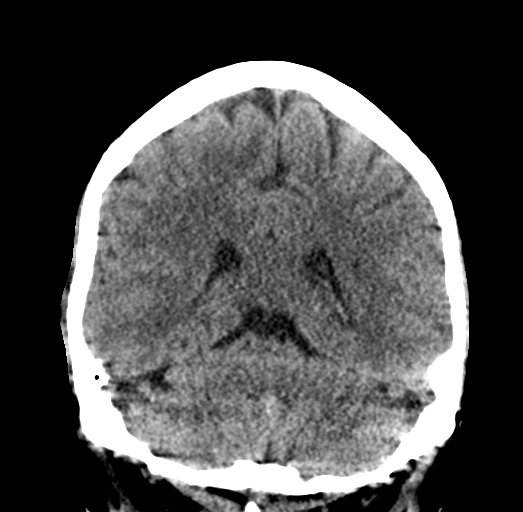
[im 34/77  brain]
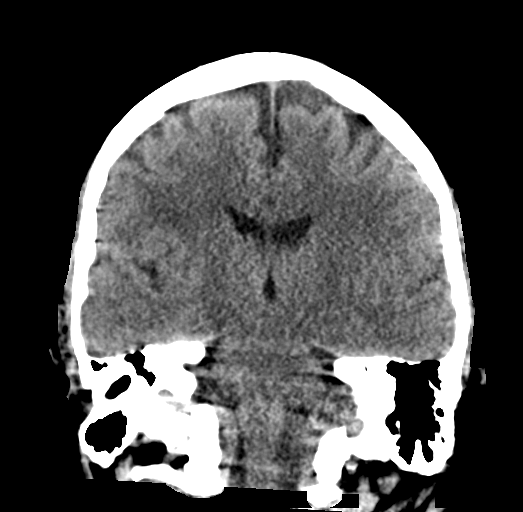
[im 43/77  brain]
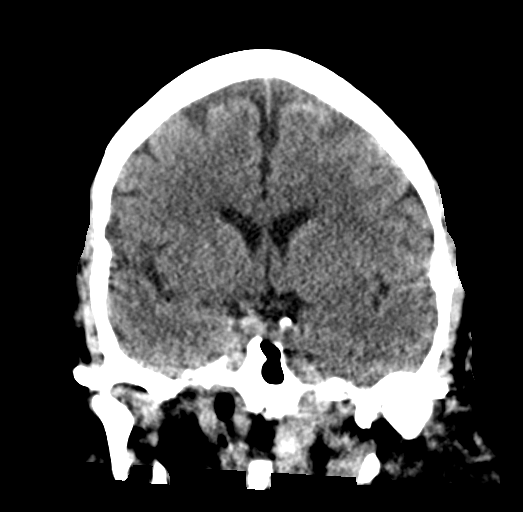

[Series 5: head without sag · sagittal · non-contrast · 0.39mm/px · 3 of 67 slices shown]
[im 23/67  brain]
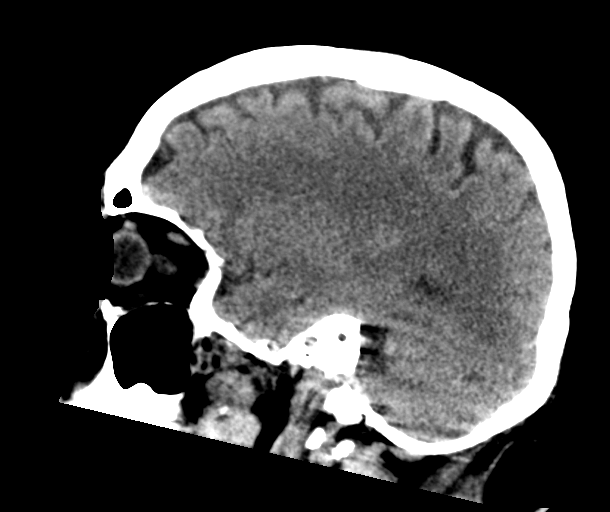
[im 34/67  brain]
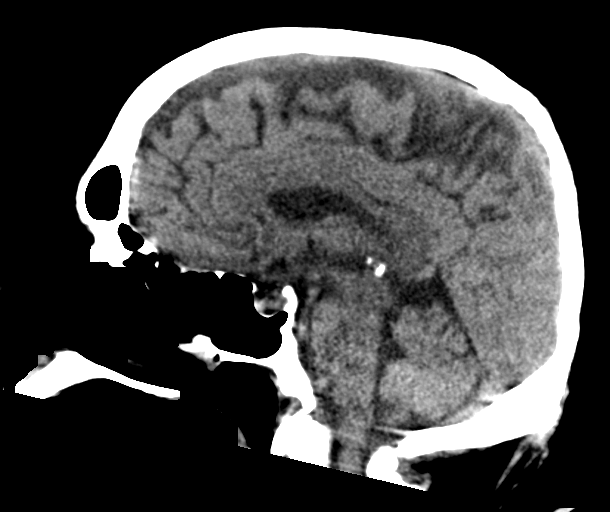
[im 45/67  brain]
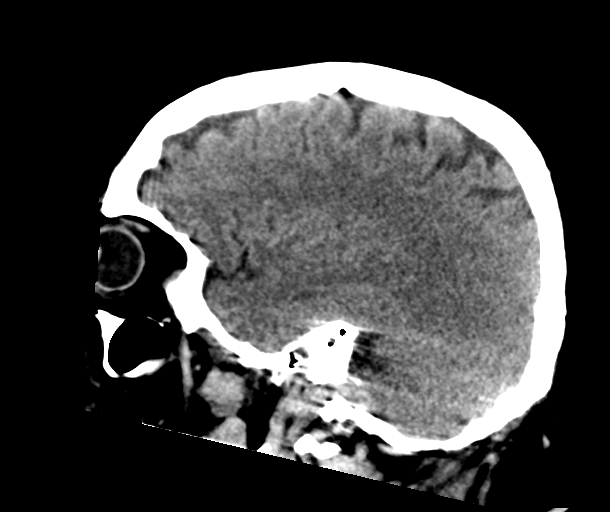

[17 of 47 positions shown; findings below may reference images not displayed]

FINDINGS: Mildly motion degraded examination.

INTRACRANIAL CONTENTS: The ventricles and sulci are normal for age.
No intraparenchymal hemorrhage, mass effect nor midline shift. No
acute large vascular territory infarcts. No abnormal extra-axial
fluid collections. Basal cisterns are patent. Minimal calcific
atherosclerosis of the carotid siphons.

ORBITS: The included ocular globes and orbital contents are
non-suspicious.

SINUSES: The mastoid aircells and included paranasal sinuses are
well-aerated.

SKULL/SOFT TISSUES: No skull fracture. No significant soft tissue
swelling.
IMPRESSION: Negative mildly motion degraded CT HEAD
